# Patient Record
Sex: Female | Born: 1950 | Race: White | Hispanic: No | Marital: Married | Smoking: Never smoker
Health system: Southern US, Community
[De-identification: ages and names within clinical notes are randomized; demographics above are authoritative.]

## PROBLEM LIST (undated history)

## (undated) DIAGNOSIS — I4891 Unspecified atrial fibrillation: Secondary | ICD-10-CM

## (undated) DIAGNOSIS — I10 Essential (primary) hypertension: Secondary | ICD-10-CM

## (undated) DIAGNOSIS — I2510 Atherosclerotic heart disease of native coronary artery without angina pectoris: Secondary | ICD-10-CM

## (undated) DIAGNOSIS — E785 Hyperlipidemia, unspecified: Secondary | ICD-10-CM

## (undated) DIAGNOSIS — N189 Chronic kidney disease, unspecified: Secondary | ICD-10-CM

## (undated) DIAGNOSIS — R0602 Shortness of breath: Secondary | ICD-10-CM

## (undated) DIAGNOSIS — G4730 Sleep apnea, unspecified: Secondary | ICD-10-CM

## (undated) HISTORY — PX: TONSILLECTOMY: SUR1361

## (undated) HISTORY — PX: CHOLEDOCHAL CYST EXCISION: SHX1342

## (undated) HISTORY — PX: PARATHYROIDECTOMY: SHX19

## (undated) HISTORY — DX: Essential (primary) hypertension: I10

## (undated) HISTORY — DX: Hyperlipidemia, unspecified: E78.5

## (undated) HISTORY — PX: ENDOVENOUS ABLATION SAPHENOUS VEIN W/ LASER: SUR449

## (undated) HISTORY — DX: Sleep apnea, unspecified: G47.30

## (undated) HISTORY — DX: Atherosclerotic heart disease of native coronary artery without angina pectoris: I25.10

## (undated) HISTORY — DX: Unspecified atrial fibrillation: I48.91

## (undated) HISTORY — PX: HEMORROIDECTOMY: SUR656

---

## 1998-05-05 ENCOUNTER — Ambulatory Visit (HOSPITAL_COMMUNITY): Admission: RE | Admit: 1998-05-05 | Discharge: 1998-05-06 | Payer: Self-pay

## 2002-03-07 ENCOUNTER — Ambulatory Visit (HOSPITAL_COMMUNITY): Admission: RE | Admit: 2002-03-07 | Discharge: 2002-03-07 | Payer: Self-pay | Admitting: Family Medicine

## 2002-03-07 ENCOUNTER — Encounter: Payer: Self-pay | Admitting: Family Medicine

## 2003-09-13 ENCOUNTER — Ambulatory Visit (HOSPITAL_COMMUNITY): Admission: RE | Admit: 2003-09-13 | Discharge: 2003-09-13 | Payer: Self-pay | Admitting: Family Medicine

## 2006-05-15 ENCOUNTER — Emergency Department (HOSPITAL_COMMUNITY): Admission: EM | Admit: 2006-05-15 | Discharge: 2006-05-15 | Payer: Self-pay | Admitting: *Deleted

## 2006-05-27 ENCOUNTER — Encounter (INDEPENDENT_AMBULATORY_CARE_PROVIDER_SITE_OTHER): Payer: Self-pay | Admitting: Cardiology

## 2006-05-27 ENCOUNTER — Inpatient Hospital Stay (HOSPITAL_COMMUNITY): Admission: EM | Admit: 2006-05-27 | Discharge: 2006-05-30 | Payer: Self-pay | Admitting: Emergency Medicine

## 2006-05-29 ENCOUNTER — Encounter: Payer: Self-pay | Admitting: Cardiology

## 2006-07-04 ENCOUNTER — Encounter: Admission: RE | Admit: 2006-07-04 | Discharge: 2006-07-04 | Payer: Self-pay | Admitting: Endocrinology

## 2007-02-09 ENCOUNTER — Encounter: Admission: RE | Admit: 2007-02-09 | Discharge: 2007-02-09 | Payer: Self-pay | Admitting: Endocrinology

## 2007-09-22 ENCOUNTER — Encounter: Admission: RE | Admit: 2007-09-22 | Discharge: 2007-09-22 | Payer: Self-pay | Admitting: Endocrinology

## 2008-05-09 IMAGING — US US ABDOMEN COMPLETE
1 series · 14 of 25 positions shown · non-contrast
Comparison: none

CLINICAL DATA: 54-year-old with right arm pain.   Abdominal pain.  Evaluate for gallbladder disease.  
 ABDOMEN ULTRASOUND:
TECHNIQUE: Complete abdominal ultrasound examination was performed including evaluation of the liver, gallbladder, bile ducts, pancreas, kidneys, spleen, IVC, and abdominal aorta.

[Series 1: unknown · 0.34mm/px · 14 of 68 slices shown]
[im 1/68]
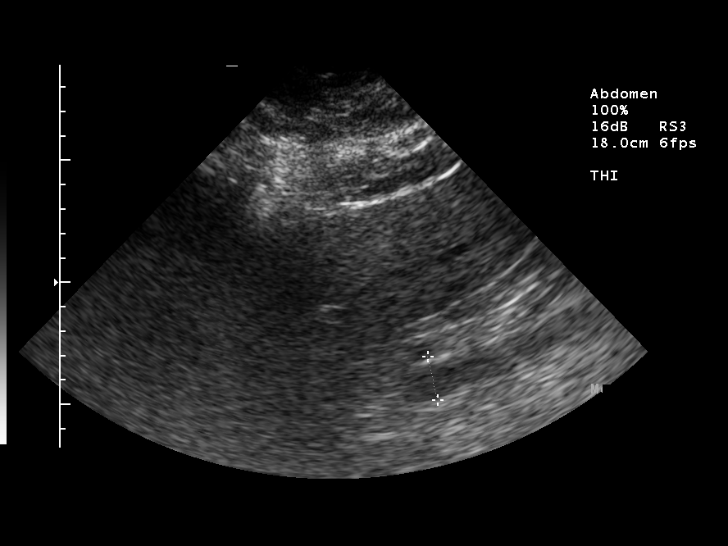
[im 6/68]
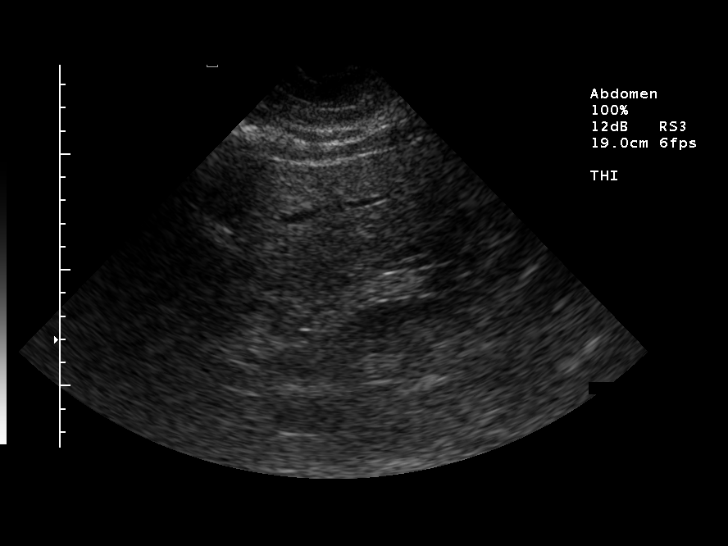
[im 12/68]
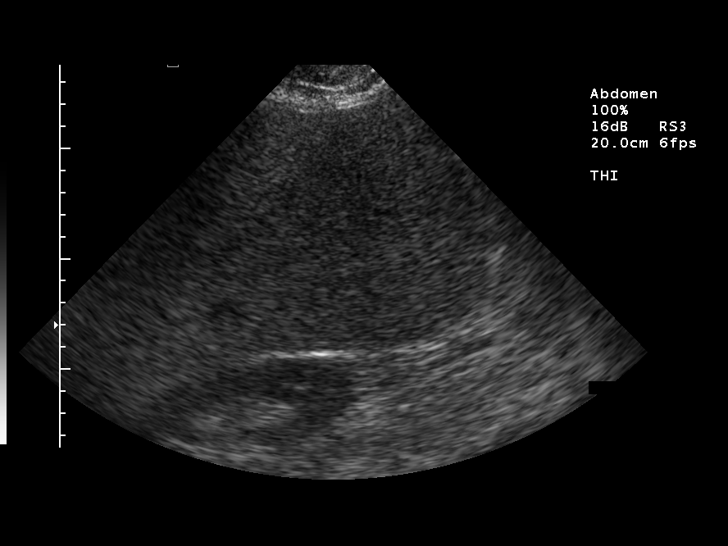
[im 17/68]
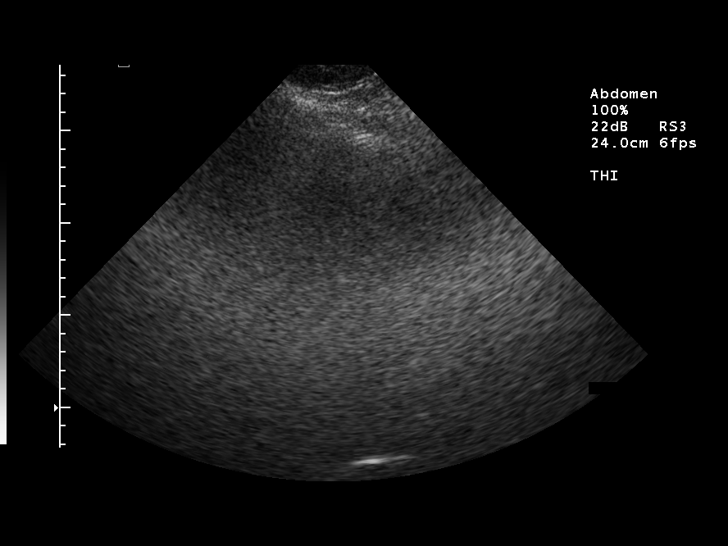
[im 23/68]
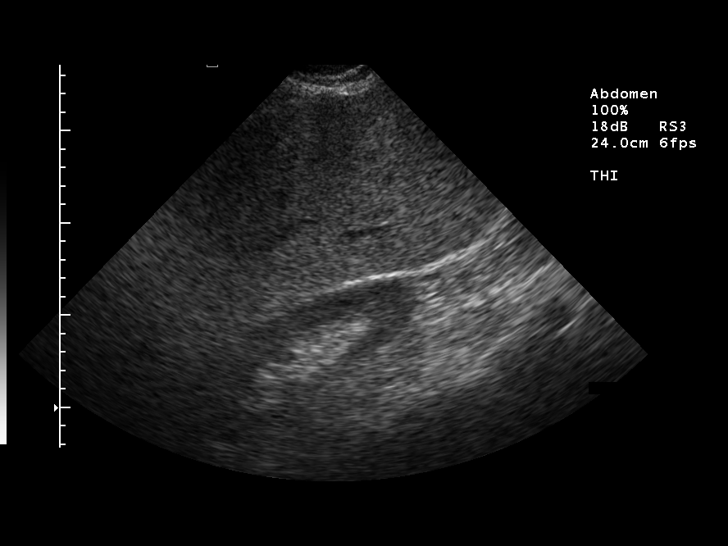
[im 26/68]
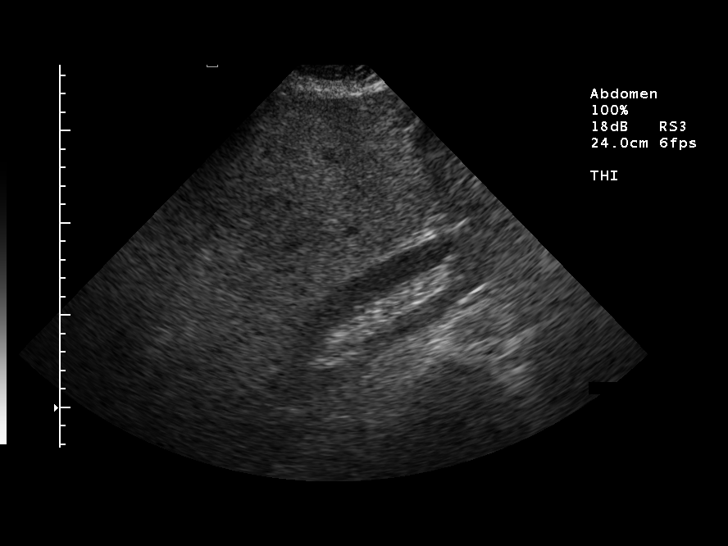
[im 31/68]
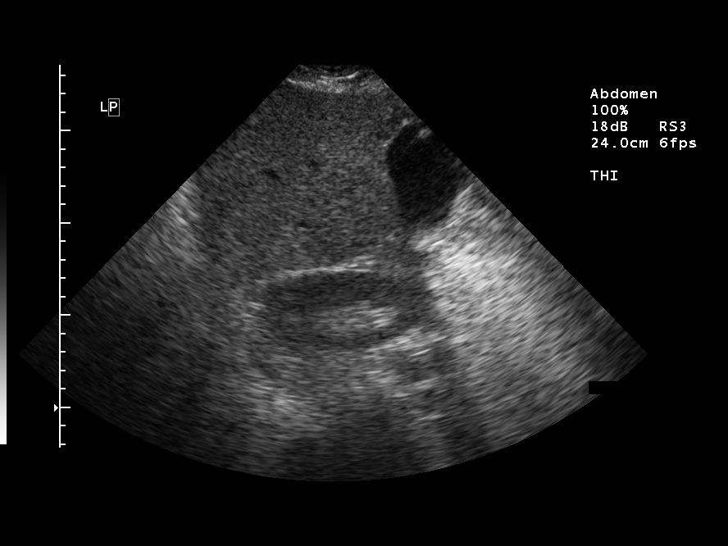
[im 37/68]
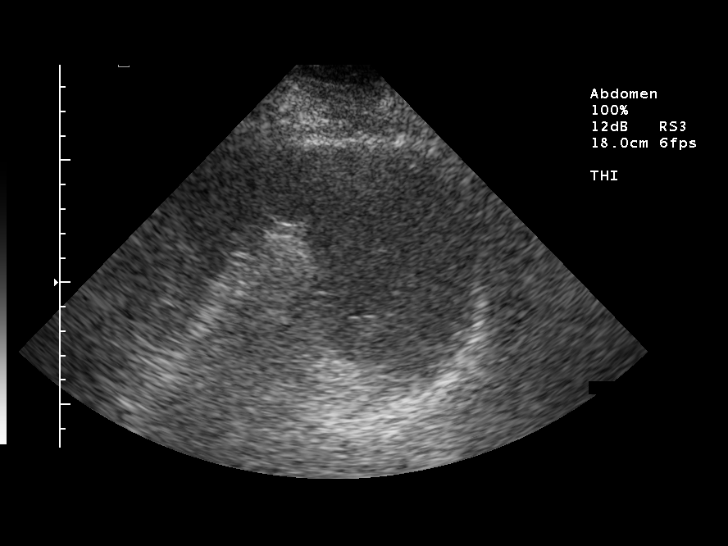
[im 42/68]
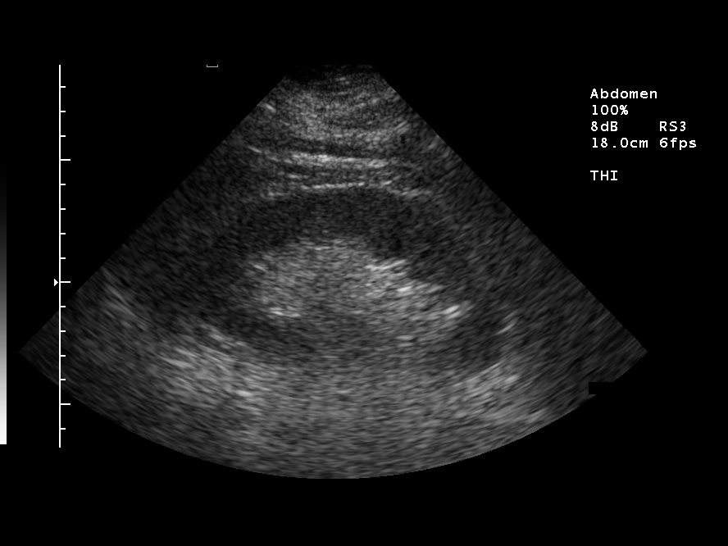
[im 45/68]
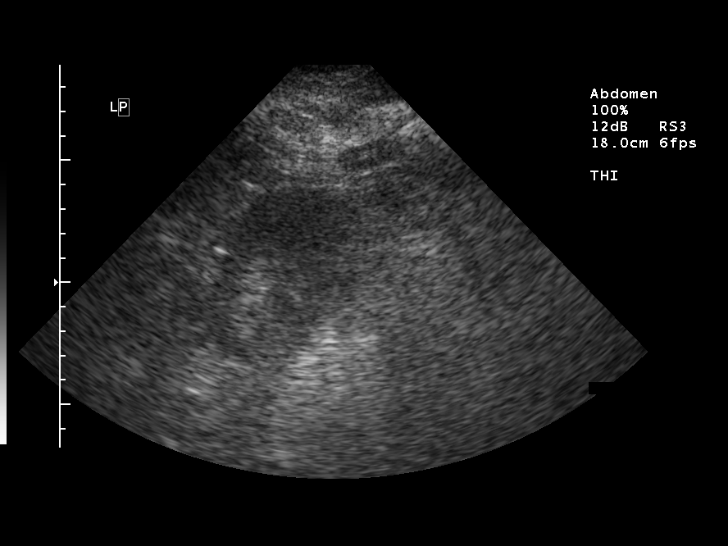
[im 51/68]
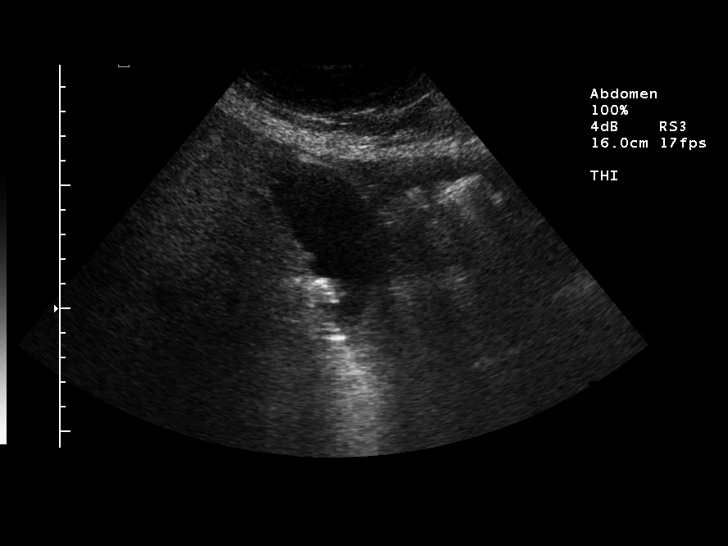
[im 56/68]
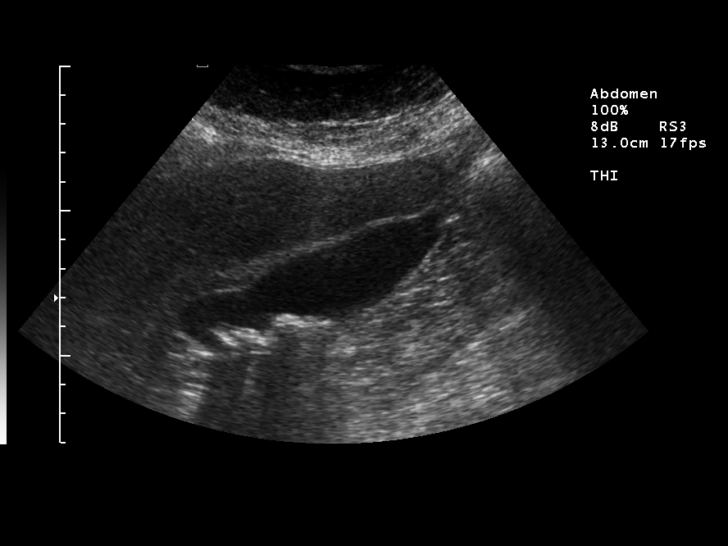
[im 62/68]
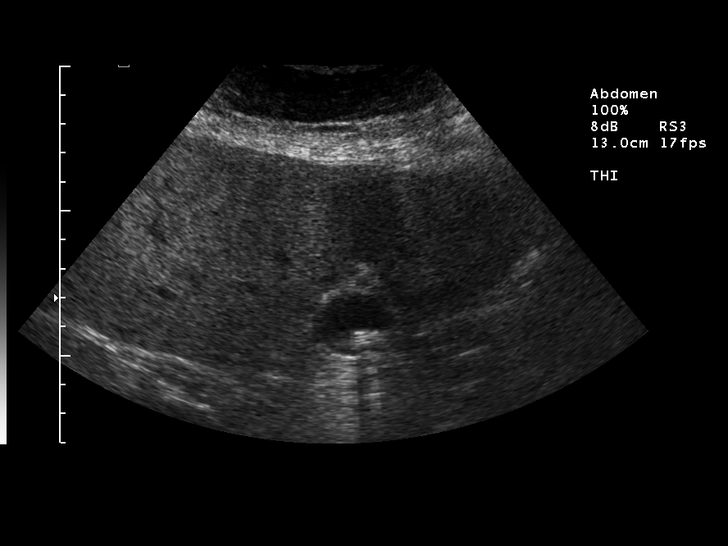
[im 68/68]
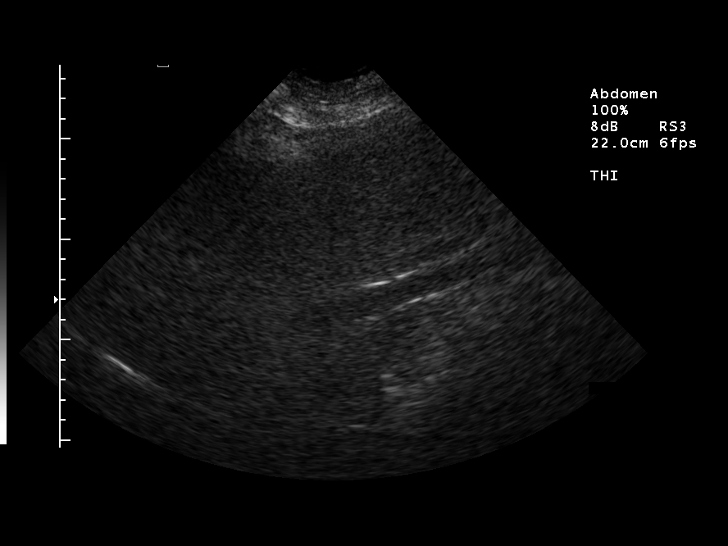

[14 of 25 positions shown; findings below may reference images not displayed]

FINDINGS: The liver is enlarged and echogenic, consistent with fatty infiltration.  No focal abnormality is identified.  Within the gallbladder numerous stones are identified.  The gallbladder wall is thickened measuring 3.3 mm.  The common bile duct is normal in caliber at 3.5 mm. Inferior vena cava, spleen, kidneys and the visualized abdominal aorta have a normal appearance.  The pancreas is not seen.
IMPRESSION: 1.  Enlarged, fatty liver.
 2.  Gallstones and thickened gallbladder wall.  The presence or absence of Murphy?s sign was not documented.

## 2008-05-21 IMAGING — CR DG CHEST 1V PORT
1 series · 1 of 1 positions shown · non-contrast
Comparison: None.

CLINICAL DATA: Right arm pain/arrhythmia.
 PORTABLE CHEST - 1 VIEW:

[view not recorded]
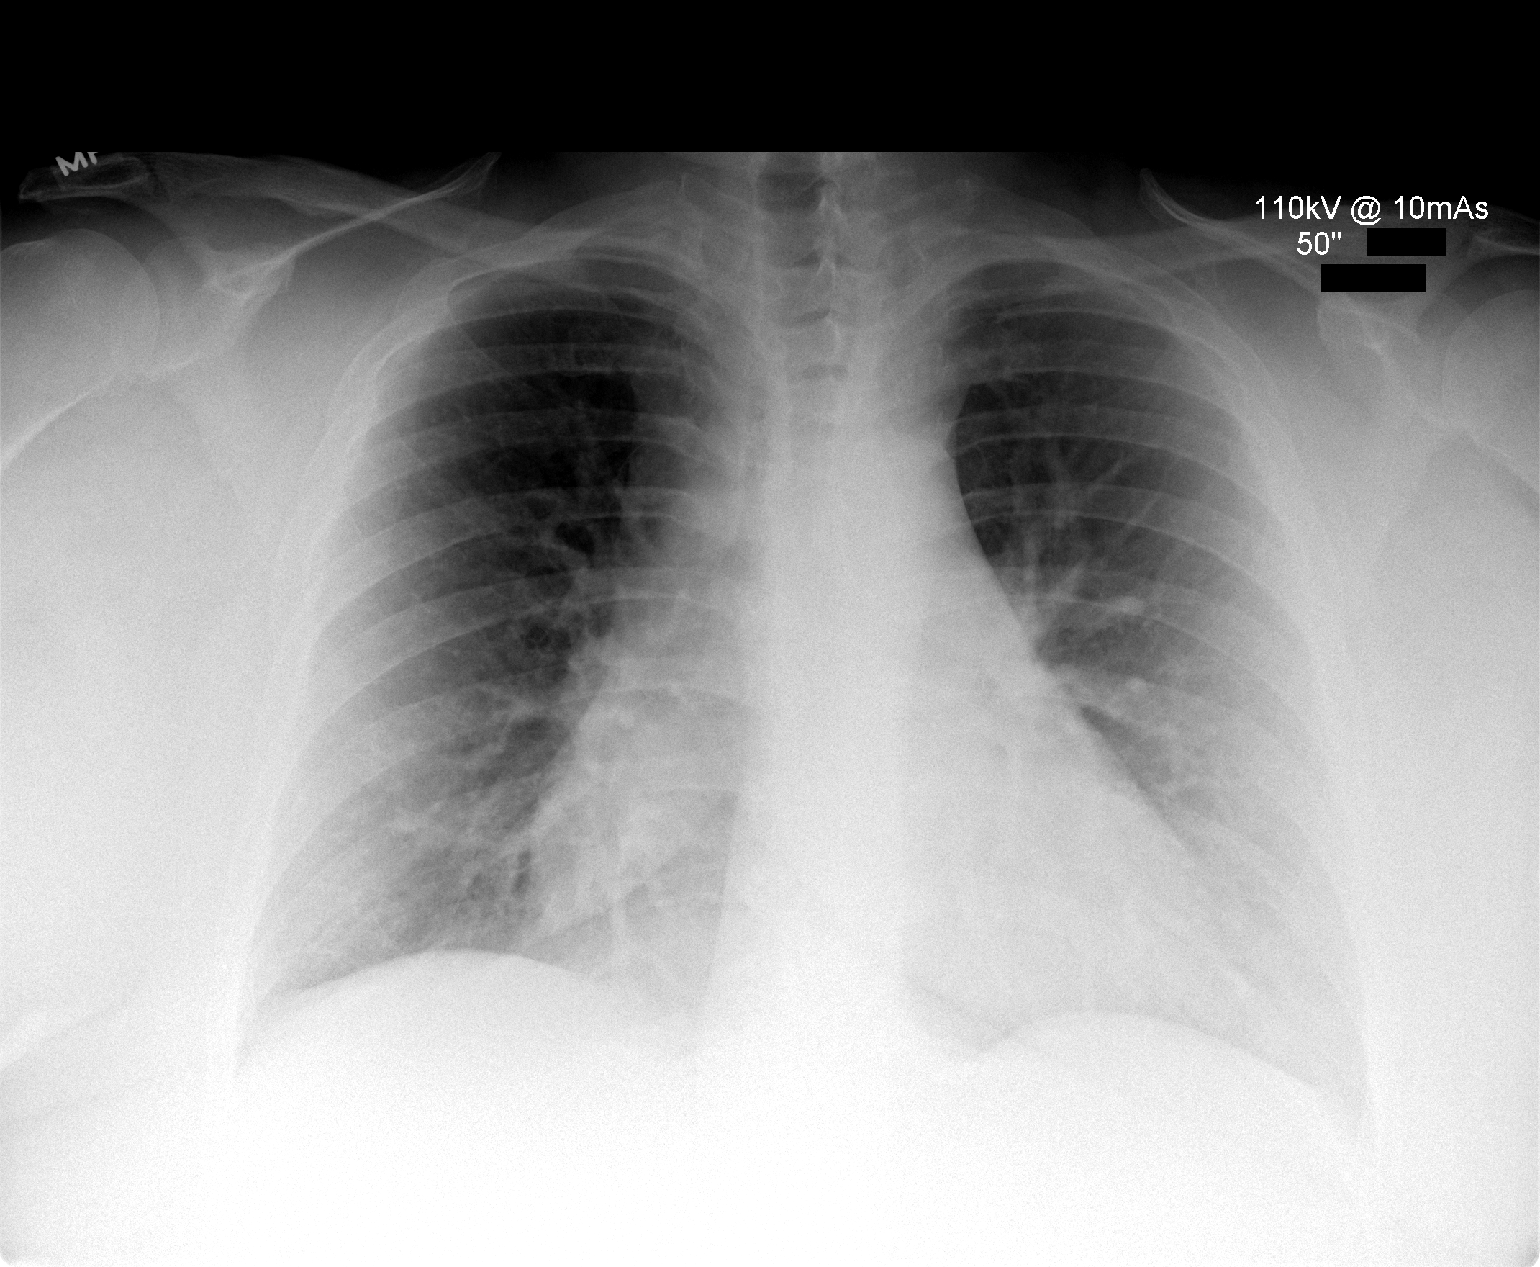

[1 of 1 positions shown; findings below may reference images not displayed]

FINDINGS: The heart is mildly enlarged.  There appears to be some degree of pulmonary venous hypertension but no frank congestive heart failure.   The lungs are clear.  Osseous structures intact in 1 view.
IMPRESSION: 1.  Cardiomegaly with venous hypertension.
 2.  No frank congestive heart failure or active disease.

## 2008-12-12 ENCOUNTER — Encounter: Admission: RE | Admit: 2008-12-12 | Discharge: 2008-12-12 | Payer: Self-pay | Admitting: Endocrinology

## 2009-10-08 ENCOUNTER — Encounter (HOSPITAL_BASED_OUTPATIENT_CLINIC_OR_DEPARTMENT_OTHER): Admission: RE | Admit: 2009-10-08 | Discharge: 2010-01-06 | Payer: Self-pay | Admitting: Internal Medicine

## 2009-10-09 ENCOUNTER — Ambulatory Visit: Admission: RE | Admit: 2009-10-09 | Discharge: 2009-10-09 | Payer: Self-pay | Admitting: General Surgery

## 2009-10-31 ENCOUNTER — Ambulatory Visit: Payer: Self-pay | Admitting: Vascular Surgery

## 2009-10-31 ENCOUNTER — Ambulatory Visit: Admission: RE | Admit: 2009-10-31 | Discharge: 2009-10-31 | Payer: Self-pay | Admitting: Internal Medicine

## 2009-10-31 ENCOUNTER — Encounter: Payer: Self-pay | Admitting: Internal Medicine

## 2010-01-06 ENCOUNTER — Encounter (HOSPITAL_BASED_OUTPATIENT_CLINIC_OR_DEPARTMENT_OTHER): Admission: RE | Admit: 2010-01-06 | Discharge: 2010-04-06 | Payer: Self-pay | Admitting: Internal Medicine

## 2010-04-16 ENCOUNTER — Encounter (HOSPITAL_BASED_OUTPATIENT_CLINIC_OR_DEPARTMENT_OTHER): Admission: RE | Admit: 2010-04-16 | Discharge: 2010-07-03 | Payer: Self-pay | Admitting: General Surgery

## 2010-07-06 ENCOUNTER — Encounter (HOSPITAL_BASED_OUTPATIENT_CLINIC_OR_DEPARTMENT_OTHER)
Admission: RE | Admit: 2010-07-06 | Discharge: 2010-10-04 | Payer: Self-pay | Source: Home / Self Care | Attending: Internal Medicine | Admitting: Internal Medicine

## 2010-07-27 ENCOUNTER — Encounter: Admission: RE | Admit: 2010-07-27 | Discharge: 2010-07-27 | Payer: Self-pay | Admitting: Family Medicine

## 2010-10-08 ENCOUNTER — Encounter (HOSPITAL_BASED_OUTPATIENT_CLINIC_OR_DEPARTMENT_OTHER)
Admission: RE | Admit: 2010-10-08 | Discharge: 2010-11-03 | Payer: Self-pay | Source: Home / Self Care | Attending: General Surgery | Admitting: General Surgery

## 2010-10-25 ENCOUNTER — Encounter: Payer: Self-pay | Admitting: Cardiology

## 2010-11-05 ENCOUNTER — Other Ambulatory Visit (HOSPITAL_COMMUNITY): Payer: Self-pay | Admitting: Obstetrics and Gynecology

## 2010-11-05 DIAGNOSIS — N838 Other noninflammatory disorders of ovary, fallopian tube and broad ligament: Secondary | ICD-10-CM

## 2010-11-06 ENCOUNTER — Encounter (HOSPITAL_BASED_OUTPATIENT_CLINIC_OR_DEPARTMENT_OTHER): Payer: BC Managed Care – HMO | Attending: General Surgery

## 2010-11-06 DIAGNOSIS — I890 Lymphedema, not elsewhere classified: Secondary | ICD-10-CM | POA: Insufficient documentation

## 2010-11-06 DIAGNOSIS — R1900 Intra-abdominal and pelvic swelling, mass and lump, unspecified site: Secondary | ICD-10-CM | POA: Insufficient documentation

## 2010-11-16 ENCOUNTER — Other Ambulatory Visit (HOSPITAL_COMMUNITY): Payer: Self-pay

## 2010-12-04 ENCOUNTER — Encounter (HOSPITAL_BASED_OUTPATIENT_CLINIC_OR_DEPARTMENT_OTHER): Payer: BC Managed Care – HMO | Attending: General Surgery

## 2010-12-04 DIAGNOSIS — I890 Lymphedema, not elsewhere classified: Secondary | ICD-10-CM | POA: Insufficient documentation

## 2010-12-04 DIAGNOSIS — R1900 Intra-abdominal and pelvic swelling, mass and lump, unspecified site: Secondary | ICD-10-CM | POA: Insufficient documentation

## 2010-12-20 LAB — BASIC METABOLIC PANEL
BUN: 19 mg/dL (ref 6–23)
CO2: 26 mEq/L (ref 19–32)
Calcium: 8.6 mg/dL (ref 8.4–10.5)
Chloride: 103 mEq/L (ref 96–112)
Creatinine, Ser: 1.02 mg/dL (ref 0.4–1.2)
GFR calc Af Amer: >60 mL/min (ref >60)
GFR calc non Af Amer: 56 mL/min — ABNORMAL LOW (ref >60)
Glucose, Bld: 91 mg/dL (ref 70–99)
Potassium: 4.1 mEq/L (ref 3.5–5.1)
Sodium: 137 mEq/L (ref 135–145)

## 2010-12-20 LAB — CBC
HCT: 35 % — ABNORMAL LOW (ref 36.0–46.0)
Hemoglobin: 11.2 g/dL — ABNORMAL LOW (ref 12.0–15.0)
MCHC: 32.1 g/dL (ref 30.0–36.0)
MCV: 77.7 fL — ABNORMAL LOW (ref 78.0–100.0)
Platelets: 238 10*3/uL (ref 150–400)
RBC: 4.51 MIL/uL (ref 3.87–5.11)
RDW: 18 % — ABNORMAL HIGH (ref 11.5–15.5)
WBC: 9 10*3/uL (ref 4.0–10.5)

## 2010-12-20 LAB — DIFFERENTIAL
Basophils Absolute: 0.1 10*3/uL (ref 0.0–0.1)
Basophils Relative: 1 % (ref 0–1)
Eosinophils Absolute: 0 10*3/uL (ref 0.0–0.7)
Eosinophils Relative: 1 % (ref 0–5)
Lymphocytes Relative: 11 % — ABNORMAL LOW (ref 12–46)
Lymphs Abs: 1 10*3/uL (ref 0.7–4.0)
Monocytes Absolute: 0.7 10*3/uL (ref 0.1–1.0)
Monocytes Relative: 7 % (ref 3–12)
Neutro Abs: 7.2 10*3/uL (ref 1.7–7.7)
Neutrophils Relative %: 80 % — ABNORMAL HIGH (ref 43–77)

## 2010-12-20 LAB — SEDIMENTATION RATE: Sed Rate: 9 mm/hr (ref 0–22)

## 2011-01-05 ENCOUNTER — Encounter (HOSPITAL_BASED_OUTPATIENT_CLINIC_OR_DEPARTMENT_OTHER): Payer: BC Managed Care – HMO | Attending: General Surgery

## 2011-01-05 DIAGNOSIS — I890 Lymphedema, not elsewhere classified: Secondary | ICD-10-CM | POA: Insufficient documentation

## 2011-01-05 DIAGNOSIS — R1900 Intra-abdominal and pelvic swelling, mass and lump, unspecified site: Secondary | ICD-10-CM | POA: Insufficient documentation

## 2011-02-03 ENCOUNTER — Encounter (HOSPITAL_BASED_OUTPATIENT_CLINIC_OR_DEPARTMENT_OTHER): Payer: BC Managed Care – HMO | Attending: General Surgery

## 2011-02-03 DIAGNOSIS — I2510 Atherosclerotic heart disease of native coronary artery without angina pectoris: Secondary | ICD-10-CM | POA: Insufficient documentation

## 2011-02-03 DIAGNOSIS — G4730 Sleep apnea, unspecified: Secondary | ICD-10-CM | POA: Insufficient documentation

## 2011-02-03 DIAGNOSIS — E119 Type 2 diabetes mellitus without complications: Secondary | ICD-10-CM | POA: Insufficient documentation

## 2011-02-03 DIAGNOSIS — Z7901 Long term (current) use of anticoagulants: Secondary | ICD-10-CM | POA: Insufficient documentation

## 2011-02-03 DIAGNOSIS — Z794 Long term (current) use of insulin: Secondary | ICD-10-CM | POA: Insufficient documentation

## 2011-02-03 DIAGNOSIS — E7800 Pure hypercholesterolemia, unspecified: Secondary | ICD-10-CM | POA: Insufficient documentation

## 2011-02-03 DIAGNOSIS — I10 Essential (primary) hypertension: Secondary | ICD-10-CM | POA: Insufficient documentation

## 2011-02-03 DIAGNOSIS — Z79899 Other long term (current) drug therapy: Secondary | ICD-10-CM | POA: Insufficient documentation

## 2011-02-03 DIAGNOSIS — L02419 Cutaneous abscess of limb, unspecified: Secondary | ICD-10-CM | POA: Insufficient documentation

## 2011-02-03 DIAGNOSIS — I890 Lymphedema, not elsewhere classified: Secondary | ICD-10-CM | POA: Insufficient documentation

## 2011-02-03 DIAGNOSIS — L97809 Non-pressure chronic ulcer of other part of unspecified lower leg with unspecified severity: Secondary | ICD-10-CM | POA: Insufficient documentation

## 2011-02-03 DIAGNOSIS — L03119 Cellulitis of unspecified part of limb: Secondary | ICD-10-CM | POA: Insufficient documentation

## 2011-02-03 DIAGNOSIS — I4891 Unspecified atrial fibrillation: Secondary | ICD-10-CM | POA: Insufficient documentation

## 2011-02-19 NOTE — Discharge Summary (Signed)
Robin [name], Robin [name]             ACCOUNT NO.:  [id number]   MEDICAL RECORD NO.:  [id number]          PATIENT TYPE:  INP   LOCATION:  1432                         FACILITY:  [facility]   PHYSICIAN:  Hollice Espy, M.D.DATE OF BIRTH:  05-Apr-1951   DATE OF ADMISSION:  05/27/2006  DATE OF DISCHARGE:  05/30/2006                                 DISCHARGE SUMMARY   PRIMARY CARE PHYSICIAN:  Stacie Acres. Cliffton Asters, M.D.   CONSULTATIONS:  Francisca December, M.D., [facility].   DISCHARGE DIAGNOSES:  1. Paroxysmal atrial fibrillation.  2. Morbid obesity.  3. Negative stress Myoview cardiac test.  4. Diabetes mellitus.  5. Hypertension.   DISCHARGE MEDICATIONS:  The patient will continue all her previous  medications.  These are as follows.  1. Wellbutrin 300 p.o. daily.  2. Glipizide 10 p.o. b.i.d.  3. Hyzaar 100/25 p.o. daily.  4. Avandia 2 mg p.o. daily.  5. Vitorin 10/80 p.o. nightly.  6. Metformin 2000 mg p.o. nightly.  7. Lantus 36 units subcutaneously nightly.  8. In addition, the patient will be discharged on Coumadin 10 mg p.o.      nightly. This medication may be adjusted depending on final Coumadin      dosing.  9. Lovenox 140 mg subcutaneously 2 times for 1 week or until Coumadin      level is within goal range.  10.Lopressor 25 mg 1/2 tablet p.o. b.i.d.   HOSPITAL COURSE:  The patient is a 60 year old white female with past  medical history of morbid obesity, diabetes mellitus, who presented with  some right arm pain. Initially when she came into the emergency room, she  was noted to have new onset atrial fibrillation. [facility] was  consulted to evaluate the patient.  The patient had a TSH done which was  normal.  A 2-D echocardiogram was ordered, and she was admitted.  She  converted to normal sinus rhythm by hospital day #2 and was doing well.  Dr.  Amil Amen took the patient for a Myoview stress test which was found to be  negative.  The patient stayed in normal  sinus rhythm. After extensive review  and discussion with the patient, it was suspected she had underlying sleep  apnea which could be a possible etiology for her atrial fibrillation.  Currently the patient is on heparin and started receiving Coumadin, and it  was the feeling that, given she is high risk for recurrent atrial  fibrillation if this is indeed secondary to sleep apnea, it was best that  she needed to stay on Coumadin for now.   PLAN:  1. In regards to atrial fibrillation.  Discharge the patient home on      Coumadin plus Lovenox with home health followup.  The patient will      receive the Coumadin instructional video as well as how to give Lovenox      prior to discharge.  Home health will follow up and check daily PT/INR      and send results to Dr. Laurann Montana.  2. In addition with regards to sleep apnea suspicion, the patient has  been      given a number to call to get an outpatient sleep study.  3. With regard to diabetes mellitus, this was stable.  She was continued      on her medications.  4. With regards to hypertension, Lopressor has been added so that she will      have better rate control in case she does have this occur again.  5. With regards to obesity, this is stable.   OVERALL DISPOSITION:  Improved.   ACTIVITY:  As tolerated.   DIET:  Low-sodium, carbohydrate-modified diet with avoiding caffeine.   FOLLOW UP:  She will follow up with Dr. Laurann Montana, her primary care  physician, in 1 week and Dr. Amil Amen, cardiology, this week.   The patient is being discharged to home.  She is cleared to return back to  work on June 01, 2006,      Hollice Espy, M.D.  Electronically Signed     SKK/MEDQ  D:  05/30/2006  T:  05/30/2006  Job:  [id number]   cc:   Francisca December, M.D.  Fax: [phone]   Stacie Acres. Cliffton Asters, M.D.  Fax: [phone]

## 2011-02-19 NOTE — H&P (Signed)
Robin [name], Robin [name]             ACCOUNT NO.:  [id number]   MEDICAL RECORD NO.:  [id number]          PATIENT TYPE:  INP   LOCATION:  1432                         FACILITY:  [facility]   PHYSICIAN:  Michelene Gardener, MD    DATE OF BIRTH:  21-Nov-1950   DATE OF ADMISSION:  05/27/2006  DATE OF DISCHARGE:                                HISTORY & PHYSICAL   PRIMARY CARE PHYSICIAN:  Dr. Laurann Montana.   CHIEF COMPLAINT:  Right arm pain.   HISTORY OF PRESENT ILLNESS:  This is a 60 year old obese Caucasian female  with past medical history of hypertension, diabetes and hypercholesterolemia  who presented with the above-mentioned complaint.  The patient states that  she has been doing fine when she started developing right arm pain yesterday  for which she took aspirin and it went away.  Today she had repeat arm pain  on her right side and she was also complaining of some numbness in her left  hand.  She said she had better come to the ER to be checked out because  there is significant family history in both her mother's and father's side.  She said that she had chest pressure around 2 days ago for which she took  Phenergan and that was resolved.  Denied shortness of breath.  There is no  vomiting.  There is no sweating.   PAST MEDICAL HISTORY:  1. Hypertension.  2. Diabetes mellitus.  3. Hypercholesterolemia.  4. Obesity.   PAST SURGICAL HISTORY:  Denied.   ALLERGIES:  NO KNOWN DRUG ALLERGIES.   MEDICATIONS:  1. Wellbutrin XL 300 mg p.o. once daily.  2. Glipizide 10 mg p.o. twice daily.  3. Hyzaar 100/25 mg p.o. once daily.  4. Avandia 2 mg p.o. once daily.  5. Vytorin 10/80 mg p.o. once daily.  6. Metformin 2000 mg p.o. once daily.  7. Lantus 36  units subcutaneously at bedtime.   SOCIAL HISTORY:  She denied drinking, she quit smoking recently and denied  recreational drugs.   FAMILY HISTORY:  Family history is significant for one of her brothers died  at the age of 43 because  of a massive heart attack, another one of her  brothers died of again massive heart attack at the age of 68, her father had  heart attack at age 106, mother had heart attack with bypass surgery around  age of 66.   REVIEW OF SYSTEMS:  CONSTITUTIONAL:  There is no fatigability, no weakness.  EYES:  No blurred vision, no pain.  ENT:  No epistaxis, no difficulty  swallowing.  RESPIRATORY:  No cough, no wheezes.  CARDIOVASCULAR:  No chest  pain, no shortness of breath.  GI:  No nausea, no vomiting, no diarrhea, no  abdominal pain.  GU:  No dysuria, no hematuria.  ENDOCRINE:  No polyuria, no  nocturia.  MUSCULOSKELETAL:  Positive for right arm pain with some left hand  numbness.  ID:  No rash, no lesions.  NEURO:  No numbness, no tingling and  positive numbness in the left hand.  The rest of systems were reviewed and  they were negative.   PHYSICAL EXAMINATION:  VITAL SIGNS:  Temperature 98.4, blood pressure is  178/89, pulse 73, respiratory rate is 18.  GENERAL APPEARANCE:  This is an obese middle-aged Caucasian female who is  laying down in bed in no significant distress at the present time.  HEENT:  Conjunctivae is normal with no palor and no erythema.  Pupils are  equal and reactive to light and accommodation.  There is no ptosis.  Hearing  is intact.  There is no ear discharge or infection.  There is no infection  or bleeding.  Oral mucosa is moist and there is no pharyngeal erythema.  NECK:  Supple.  No JVD.  No carotid bruit.  No lymphadenopathy .  No thyroid  enlargement.  No thyroid tenderness.  CARDIOVASCULAR EXAMINATION:  S1 and S2 were heard and they are regular.  There is no murmur,  or rubs.  RESPIRATORY EXAMINATION:  The patient is breathing between 16-18, no use of  accessory muscles, no intercostal retractions, no dullness, no rales, no  rhonchi, no wheezes.  ABDOMINAL EXAMINATION:  The patient is soft, nondistended, nontender, no  hepatosplenomegaly.  Bowel sounds are  normal.  Umbilicus central.  LOWER EXTREMITIES:  No edema, no rash and no varicose veins.  SKIN:  No rash and no erythema.  NEURO EXAMINATION:  Cranial nerves are intact, no sensory deficits.   LABORATORY RESULTS:  WBC 7.8, hemoglobin 15.7, hematocrit 41.8, MCV is 81.3,  platelets 257.  Sodium 141, potassium 3.8, chloride 107, bicarb 26, glucose  176, BUN 6, creatinine 0.65, calcium 9.0.  Chest x-ray showed cardiomegaly  with venous hypertension.  There is no active disease.  EKG is atrial  fibrillation with rate of 82.  There is no evidence of acute ischemia.   IMPRESSION AND ASSESSMENT:  1. New onset atrial fibrillation.  This patient has never had history of      atrial fibrillation.  I will admit her to telemetry.  I will start her      on Lovenox 100 mg p.o. twice daily.  I will get echocardiogram for      further evaluation.  I will also get cardiology consultation to see if      this patient will be a candidate for long-term anticoagulation.  2. Acute coronary syndrome.  This patient had atypical pain when she had      chest pain around 2 days ago and then was complaining of right shoulder      pain and some left hand numbness.  Her presentation is very atypical      but she had multiple risk factors including diabetes, hypertension,      hypercholesterolemia, obesity and very strong family history, so I will      admit her to telemetry, get three sets of troponin and cardiac enzymes,      start her on aspirin, beta blockers, lisinopril and Lovenox.  I will      also get cardiology consultation for further evaluation and possible      stress test.  3. Diabetes mellitus.  We will continue her current medications and I will      put her on insulin sliding scale.  I will also get hemoglobin A1c to      assess her control in the last 3 months.  4. Hypertension.  We will continue her current medications and we will      follow her blood pressure. 5. Hypercholesterolemia.  We will  continue her  current medications.  6. Obesity.  The patient was advised about the risk.   Total assessment time is 50 minutes.      Michelene Gardener, MD  Electronically Signed     NAE/MEDQ  D:  05/27/2006  T:  05/27/2006  Job:  [id number]   cc:   Stacie Acres. Cliffton Asters, M.D.  Fax: [phone]

## 2011-03-05 ENCOUNTER — Encounter (HOSPITAL_BASED_OUTPATIENT_CLINIC_OR_DEPARTMENT_OTHER): Payer: BC Managed Care – HMO | Attending: General Surgery

## 2011-03-05 DIAGNOSIS — Z794 Long term (current) use of insulin: Secondary | ICD-10-CM | POA: Insufficient documentation

## 2011-03-05 DIAGNOSIS — I890 Lymphedema, not elsewhere classified: Secondary | ICD-10-CM | POA: Insufficient documentation

## 2011-03-05 DIAGNOSIS — L03119 Cellulitis of unspecified part of limb: Secondary | ICD-10-CM | POA: Insufficient documentation

## 2011-03-05 DIAGNOSIS — I4891 Unspecified atrial fibrillation: Secondary | ICD-10-CM | POA: Insufficient documentation

## 2011-03-05 DIAGNOSIS — I2510 Atherosclerotic heart disease of native coronary artery without angina pectoris: Secondary | ICD-10-CM | POA: Insufficient documentation

## 2011-03-05 DIAGNOSIS — I10 Essential (primary) hypertension: Secondary | ICD-10-CM | POA: Insufficient documentation

## 2011-03-05 DIAGNOSIS — Z79899 Other long term (current) drug therapy: Secondary | ICD-10-CM | POA: Insufficient documentation

## 2011-03-05 DIAGNOSIS — L97809 Non-pressure chronic ulcer of other part of unspecified lower leg with unspecified severity: Secondary | ICD-10-CM | POA: Insufficient documentation

## 2011-03-05 DIAGNOSIS — G4730 Sleep apnea, unspecified: Secondary | ICD-10-CM | POA: Insufficient documentation

## 2011-03-05 DIAGNOSIS — L02419 Cutaneous abscess of limb, unspecified: Secondary | ICD-10-CM | POA: Insufficient documentation

## 2011-03-05 DIAGNOSIS — E119 Type 2 diabetes mellitus without complications: Secondary | ICD-10-CM | POA: Insufficient documentation

## 2011-03-05 DIAGNOSIS — E7800 Pure hypercholesterolemia, unspecified: Secondary | ICD-10-CM | POA: Insufficient documentation

## 2011-03-05 DIAGNOSIS — Z7901 Long term (current) use of anticoagulants: Secondary | ICD-10-CM | POA: Insufficient documentation

## 2011-04-05 ENCOUNTER — Encounter (HOSPITAL_BASED_OUTPATIENT_CLINIC_OR_DEPARTMENT_OTHER): Payer: BC Managed Care – PPO | Attending: General Surgery

## 2011-04-05 DIAGNOSIS — I4891 Unspecified atrial fibrillation: Secondary | ICD-10-CM | POA: Insufficient documentation

## 2011-04-05 DIAGNOSIS — I10 Essential (primary) hypertension: Secondary | ICD-10-CM | POA: Insufficient documentation

## 2011-04-05 DIAGNOSIS — E119 Type 2 diabetes mellitus without complications: Secondary | ICD-10-CM | POA: Insufficient documentation

## 2011-04-05 DIAGNOSIS — I2510 Atherosclerotic heart disease of native coronary artery without angina pectoris: Secondary | ICD-10-CM | POA: Insufficient documentation

## 2011-04-05 DIAGNOSIS — E7800 Pure hypercholesterolemia, unspecified: Secondary | ICD-10-CM | POA: Insufficient documentation

## 2011-04-05 DIAGNOSIS — I890 Lymphedema, not elsewhere classified: Secondary | ICD-10-CM | POA: Insufficient documentation

## 2011-04-05 DIAGNOSIS — Z79899 Other long term (current) drug therapy: Secondary | ICD-10-CM | POA: Insufficient documentation

## 2011-04-05 DIAGNOSIS — L0889 Other specified local infections of the skin and subcutaneous tissue: Secondary | ICD-10-CM | POA: Insufficient documentation

## 2011-04-05 DIAGNOSIS — B961 Klebsiella pneumoniae [K. pneumoniae] as the cause of diseases classified elsewhere: Secondary | ICD-10-CM | POA: Insufficient documentation

## 2011-04-05 DIAGNOSIS — L97809 Non-pressure chronic ulcer of other part of unspecified lower leg with unspecified severity: Secondary | ICD-10-CM | POA: Insufficient documentation

## 2011-04-05 DIAGNOSIS — A4902 Methicillin resistant Staphylococcus aureus infection, unspecified site: Secondary | ICD-10-CM | POA: Insufficient documentation

## 2011-04-05 DIAGNOSIS — I872 Venous insufficiency (chronic) (peripheral): Secondary | ICD-10-CM | POA: Insufficient documentation

## 2011-04-05 DIAGNOSIS — Z7901 Long term (current) use of anticoagulants: Secondary | ICD-10-CM | POA: Insufficient documentation

## 2011-05-07 ENCOUNTER — Encounter (HOSPITAL_BASED_OUTPATIENT_CLINIC_OR_DEPARTMENT_OTHER): Payer: BC Managed Care – PPO | Attending: General Surgery

## 2011-05-07 DIAGNOSIS — Z7901 Long term (current) use of anticoagulants: Secondary | ICD-10-CM | POA: Insufficient documentation

## 2011-05-07 DIAGNOSIS — Z79899 Other long term (current) drug therapy: Secondary | ICD-10-CM | POA: Insufficient documentation

## 2011-05-07 DIAGNOSIS — I10 Essential (primary) hypertension: Secondary | ICD-10-CM | POA: Insufficient documentation

## 2011-05-07 DIAGNOSIS — B961 Klebsiella pneumoniae [K. pneumoniae] as the cause of diseases classified elsewhere: Secondary | ICD-10-CM | POA: Insufficient documentation

## 2011-05-07 DIAGNOSIS — I890 Lymphedema, not elsewhere classified: Secondary | ICD-10-CM | POA: Insufficient documentation

## 2011-05-07 DIAGNOSIS — I872 Venous insufficiency (chronic) (peripheral): Secondary | ICD-10-CM | POA: Insufficient documentation

## 2011-05-07 DIAGNOSIS — L97809 Non-pressure chronic ulcer of other part of unspecified lower leg with unspecified severity: Secondary | ICD-10-CM | POA: Insufficient documentation

## 2011-05-07 DIAGNOSIS — E7800 Pure hypercholesterolemia, unspecified: Secondary | ICD-10-CM | POA: Insufficient documentation

## 2011-05-07 DIAGNOSIS — I4891 Unspecified atrial fibrillation: Secondary | ICD-10-CM | POA: Insufficient documentation

## 2011-05-07 DIAGNOSIS — E119 Type 2 diabetes mellitus without complications: Secondary | ICD-10-CM | POA: Insufficient documentation

## 2011-05-07 DIAGNOSIS — L0889 Other specified local infections of the skin and subcutaneous tissue: Secondary | ICD-10-CM | POA: Insufficient documentation

## 2011-05-07 DIAGNOSIS — I2510 Atherosclerotic heart disease of native coronary artery without angina pectoris: Secondary | ICD-10-CM | POA: Insufficient documentation

## 2011-05-07 DIAGNOSIS — A4902 Methicillin resistant Staphylococcus aureus infection, unspecified site: Secondary | ICD-10-CM | POA: Insufficient documentation

## 2011-05-19 NOTE — Progress Notes (Signed)
[facility]  NAME:  Robin [name], Robin [name]             ACCOUNT NO.:  [id number]  MEDICAL RECORD NO.:  [id number]      DATE OF BIRTH:  07/23/1951  PHYSICIAN:  Wayland Denis, DO       VISIT DATE:  05/19/2011                                  OFFICE VISIT   Robin [name] is a 60 year old white female, here for followup on her right lower extremity lymphedema.  She has been using alginate and Unna boot.  She does have some pumps at home, but has not been using them. She saw Vascular consult and the results are unclear.  She also saw a dermatologist who recommended clobetasol which she has been using, so she removed the Unna boot and just has been using the steroid cream. This has caused some increased redness and swelling as well as drainage, so we will modify this.  It has gotten worse as a result over this past week.  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  MEDICATIONS:  Include Lantus, Humalog, Lasix, metoprolol, Hyzaar, Vytorin, Coumadin, and bupropion.  ALLERGIES:  She does not have any allergies.  She was treated for methicillin-resistant staph aureus with Bactrim in late June 2012 and July 2012, this seemed to help.  REVIEW OF SYSTEMS:  Otherwise, negative and no change.  SOCIAL HISTORY:  Unchanged and not smoking.  FAMILY HISTORY:  Noncontributory.  PHYSICAL EXAMINATION:  GENERAL:  She is alert, oriented, and cooperative, in no acute distress.  She is very pleasant. HEENT:  Her pupils were equal.  Extraocular muscles were intact.  No cervical lymphadenopathy. LUNGS:  Clear. HEART:  Irregular, but pulses are equal bilaterally. ABDOMEN:  Soft. EXTREMITIES:  Her lower extremities, she has edema bilaterally, but certainly worse on the right with some clear serous drainage.  There is no actual  wound, but there is some redness that is in the distribution of where her dressing had been.  ASSESSMENT:  Vascular consult second opinion, elevation, vitamin C 500 mg twice a day, zinc try to get close to 220 mg daily, compression at night and in the morning.  We will place a steroid cream with zinc for the periwound area, alginate for the drainage and Unna boot, and we will see her back in 1 week to see her progress.  She was instructed to call us immediately if there was any burning or pain in the area.  She is in agreement with this plan.     Wayland Denis, DO     CS/MEDQ  D:  05/19/2011  T:  05/19/2011  Job:  [id number]

## 2011-05-21 ENCOUNTER — Encounter (INDEPENDENT_AMBULATORY_CARE_PROVIDER_SITE_OTHER): Payer: BC Managed Care – HMO

## 2011-05-21 DIAGNOSIS — L97109 Non-pressure chronic ulcer of unspecified thigh with unspecified severity: Secondary | ICD-10-CM

## 2011-05-21 DIAGNOSIS — R609 Edema, unspecified: Secondary | ICD-10-CM

## 2011-05-26 NOTE — Progress Notes (Signed)
[facility]  NAME:  Robin [name], Robin [name]             ACCOUNT NO.:  [id number]  MEDICAL RECORD NO.:  [id number]      DATE OF BIRTH:  1951-01-10  PHYSICIAN:  Wayland Denis, DO       VISIT DATE:  05/26/2011                                  OFFICE VISIT   Robin [name] is a 60 year old white female who is here for followup from right lower extremity lymphedema.  She has been using with clobetasol, Coban wrap, zinc alginate with very good improvement.  She is quite pleased with it.  The pain initially was pretty irritating with some burning feeling, but has improved since with less drainage and less redness.  There is no change in her medications.  Review of systems is negative otherwise noted.  PHYSICAL EXAMINATION:  She is alert and oriented, cooperative, appropriate, not in any acute distress.  Her breathing is unlabored. Her heart is regular.  Pulses are strong.  Pupils are equal. Extraocular muscles are intact.  Wound is as described.  We will continue with leg pumps at home, vitamin C, zinc alginate, clobetasol, and Coban wrap, and we will wait for the review and report from the vascular surgeon.  We will see her back in 1 week.     Wayland Denis, DO     CS/MEDQ  D:  05/26/2011  T:  05/26/2011  Job:  [id number]

## 2011-05-28 NOTE — Procedures (Unsigned)
DUPLEX DEEP VENOUS EXAM - LOWER EXTREMITY  INDICATION:  Ulcerations.  HISTORY:  Edema:  Yes. Trauma/Surgery:  No. Pain:  Yes. PE:  No. Previous DVT:  No. Anticoagulants:  No. Other:  DUPLEX EXAM:               CFV   SFV   PopV  PTV    GSV               R  L  R  L  R  L  R   L  R  L Thrombosis    o  o  o  o  o  o  o   o  o  o Spontaneous   +  +  +  +  +  +  +   +  +  + Phasic        +  +  +  +  +  +  +   +  +  + Augmentation  +  +  +  +  +  +  +   +  +  + Compressible  +  +  +  +  +  +  +   +  +  + Competent     +  o  +  o  +  +  +   +  o  o  Legend:  + - yes  o - no  p - partial  D - decreased   IMPRESSION: 1. Bilateral patent deep venous systems of the lower extremities. 2. Right small and bilateral great saphenous veins present with reflux     of greater than 500 milliseconds. 3. The left small saphenous vein is patent and competent.         _____________________________ Larina Earthly, M.D.  SH/MEDQ  D:  05/21/2011  T:  05/21/2011  Job:  [id number]

## 2011-06-02 NOTE — Progress Notes (Signed)
[facility]  NAME:  Robin [name], Robin [name]             ACCOUNT NO.:  [id number]  MEDICAL RECORD NO.:  [id number]      DATE OF BIRTH:  1951/04/16  PHYSICIAN:  Wayland Denis, DO       VISIT DATE:  06/02/2011                                  OFFICE VISIT   [name] is here for followup on her right lower extremity ulcers. She was using the Coban 2 wraps with some zinc and alginate.  In the past two times, she has not used the clobetasol.  She was doing well when she came for her re-wrap, but seems to got really irritated this past few days.  We are not really sure why that has happened.  There has been no other change in her review of systems, medications, or social situation.  On exam, she is alert, oriented, cooperative, not in any acute distress.  She is very pleasant.  Her pupils are equal. Extraocular muscles are intact.  No cervical lymphadenopathy.  Breathing is unlabored.  Heart is regular.  Lower extremity is very red, little bit oozy, it does not look infected, but definitely the skin has been irritated.  We would like her to continue with the compression.  We are going to try mixing some of the clobetasol with TCA and continue with the Coban wrap.  Her vascular studies came back and are not clear to me anyway and whether or not she is a candidate for any intervention.  This study shows some insufficiency.  We are going to have Dr. Arbie Cookey take a look at her to see if she is a candidate for anything that may help and she is agreed to that.  We will see her back in 1 week.     Wayland Denis, DO     CS/MEDQ  D:  06/02/2011  T:  06/02/2011  Job:  [id number]

## 2011-06-09 ENCOUNTER — Encounter (HOSPITAL_BASED_OUTPATIENT_CLINIC_OR_DEPARTMENT_OTHER): Payer: BC Managed Care – PPO | Attending: Plastic Surgery

## 2011-06-09 DIAGNOSIS — I890 Lymphedema, not elsewhere classified: Secondary | ICD-10-CM | POA: Insufficient documentation

## 2011-06-16 NOTE — Progress Notes (Signed)
[facility]  NAME:  Robin [name], Robin [name]             ACCOUNT NO.:  [id number]  MEDICAL RECORD NO.:  [id number]      DATE OF BIRTH:  02/22/1951  PHYSICIAN:  Wayland Denis, DO       VISIT DATE:  06/16/2011                                  OFFICE VISIT   Danniella is here for followup on her right lower extremity lymphedema. She has been using compression with Coban, TCA, and clobetasol for the past 2 weeks.  She intermittently uses the compression that she has at home.  She admits to not being completely compliant with the treatment protocol, elevation, compression.  Unfortunately, there has not been any weight loss.  Additionally, unfortunately she removed her dressing 5 days ago and did not come to have it reapplied, and admits to being aware of that was the agreed upon plan.  [facility].  Her main doctor was Dr. Lurene Shadow. Her blood sugars are controlled.  Her blood pressure is controlled.  On exam, there has been no other change in her medications or social history.  On exam, she is alert, oriented, and cooperative.  She is pleasant.  Her pupils are equal.  Her breathing is unlabored.  Her extraocular muscles are intact.  Her pulses are strong and regular. Lower extremities, bilateral swelling with hard, woody skin on the left, and today she has a Maxi Pad wrapped around her leg.  She says she bumped and it blistered up.  She does not have compression hose on.  She has an old tube sock on the right side, it looks like a dead on last visit, the skin is tight.  There is some chronic redness, venous insufficiency, lymphedema.  There is no open wound.  There is just an overall ooze to the area.  We had an extensive talk about treatment and prognosis.  She has an appointment scheduled with Vascular Surgery for evaluation of her venous system.  This might provide some assistance. In the meantime, we have recommended Coban,  TCA, continuing compression pumps.  We encouraged her to do it at a minimum of twice a day for a minimum of an hour.  We also talked to her about Palliative Care, and at this point, the goal is to maintain improvement any opening in the skin. She is also asking for pain medicine.  We have instructed her that the chronic pain service will have to manage that if in fact she needs narcotics and that we do not want her to be in pain but she needs more than a Tylenol or Aleve can offer on an indefinite basis.  Then, we will have to have her see the Pain Service and she acknowledged understanding that at the last visit as well, so we will see her back in a couple of weeks to change the Coban after that, it will be likely every 3 months for maintenance.     Wayland Denis, DO     CS/MEDQ  D:  06/16/2011  T:  06/16/2011  Job:  [id number]

## 2011-07-07 ENCOUNTER — Encounter (HOSPITAL_BASED_OUTPATIENT_CLINIC_OR_DEPARTMENT_OTHER): Payer: BC Managed Care – PPO | Attending: Plastic Surgery

## 2011-07-07 DIAGNOSIS — I890 Lymphedema, not elsewhere classified: Secondary | ICD-10-CM | POA: Insufficient documentation

## 2011-07-09 ENCOUNTER — Encounter: Payer: Self-pay | Admitting: Surgery

## 2011-07-12 ENCOUNTER — Encounter (INDEPENDENT_AMBULATORY_CARE_PROVIDER_SITE_OTHER): Payer: BC Managed Care – PPO | Admitting: Surgery

## 2011-07-12 ENCOUNTER — Other Ambulatory Visit (INDEPENDENT_AMBULATORY_CARE_PROVIDER_SITE_OTHER): Payer: BC Managed Care – PPO | Admitting: *Deleted

## 2011-07-12 ENCOUNTER — Other Ambulatory Visit: Payer: Self-pay

## 2011-07-12 ENCOUNTER — Encounter: Payer: Self-pay | Admitting: Surgery

## 2011-07-12 ENCOUNTER — Ambulatory Visit (INDEPENDENT_AMBULATORY_CARE_PROVIDER_SITE_OTHER): Payer: BC Managed Care – PPO | Admitting: Surgery

## 2011-07-12 VITALS — BP 105/64 | HR 64 | Resp 20 | Ht 65.0 in | Wt 298.6 lb

## 2011-07-12 DIAGNOSIS — L97209 Non-pressure chronic ulcer of unspecified calf with unspecified severity: Secondary | ICD-10-CM

## 2011-07-12 DIAGNOSIS — I83219 Varicose veins of right lower extremity with both ulcer of unspecified site and inflammation: Secondary | ICD-10-CM

## 2011-07-12 DIAGNOSIS — I872 Venous insufficiency (chronic) (peripheral): Secondary | ICD-10-CM

## 2011-07-12 NOTE — Progress Notes (Signed)
[facility] [place]   Patient name: Robin [name] MRN: [id number] DOB: Dec 19, 1950 Sex: female   Referred by: Dr. Kelly Splinter  Reason for referral: Leg pain and swelling  HISTORY OF PRESENT ILLNESS: This is a 60 year old female I've seen for evaluation of the anus insufficiency. The patient states that her problems began in January of 2011. Her swelling became so problematic for her that she had to quit work. About that time she had a ultrasound at a vein center here in town that showed no significant reflux. She also underwent a CT scan which other than a uterine polyp was unremarkable. Because of the significant swelling in her leg she saw a gastroenterologist who did a complete liver workup which was unremarkable. She's also undergone an unremarkable cardiac workup. She does describe having opened A. Cardura onto her right leg in the fall of 2010 which was slow to heal. She is treated on multiple courses of antibiotics and at the wound center. Cultures have grown out MRSA as well as Klebsiella. She has been on off antibiotics since that time currently she is taking doxycycline for recent bruise to her left leg. The patient has essentially been in compression wraps for over a year due to the inability to heal wounds on her legs. The right leg seems to be the most problematic leg. There is continuous drainage and weeping from the leg which prohibits her from being able to wear compression stockings.  Patient has multiple medical problems which include diabetes hypertension hypercholesterolemia all of which are medically managed. She does take Coumadin for atrial fibrillation.  Past Medical History  Diagnosis Date  . Diabetes mellitus   . Hypertension   . Hyperlipidemia   . Atrial fibrillation   . Sleep apnea   . Cholelithiasis   . CAD (coronary artery disease)     Past Surgical History  Procedure Date  . Tonsillectomy 1970s  . Hemorroidectomy 1990s  . Choledochal  cyst excision 1980s  . Parathyroidectomy     History   Social History  . Marital Status: Married    Spouse Name: N/A    Number of Children: N/A  . Years of Education: N/A   Occupational History  . Not on file.   Social History Main Topics  . Smoking status: Never Smoker   . Smokeless tobacco: Not on file  . Alcohol Use: No  . Drug Use: No  . Sexually Active:    Other Topics Concern  . Not on file   Social History Narrative  . No narrative on file    Family History  Problem Relation Age of Onset  . Hypertension Mother   . Heart disease Father 67    MI  . COPD Father   . Other Brother     heart issues  . Heart disease Brother   . Other Brother     heart issues  . Heart disease Brother   . Drug abuse Brother     Allergies as of 07/12/2011  . (No Known Allergies)    Current Outpatient Prescriptions on File Prior to Visit  Medication Sig Dispense Refill  . buPROPion (WELLBUTRIN XL) 300 MG 24 hr tablet Take 1 tablet by mouth daily.       Marland Kitchen doxycycline (VIBRA-TABS) 100 MG tablet Take 1 tablet by mouth 2 (two) times daily. X 2 weeks      . Furosemide (LASIX PO) Take 40 mg by mouth 2 (two) times daily.       Marland Kitchen  HYDROcodone-acetaminophen (NORCO) 5-325 MG per tablet Take 1 tablet by mouth every 6 (six) hours as needed.       . insulin glargine (LANTUS) 100 UNIT/ML injection Inject 30 Units into the skin at bedtime.       . insulin lispro (HUMALOG) 100 UNIT/ML injection Inject into the skin. Inject 25-30 units with each meal      . losartan-hydrochlorothiazide (HYZAAR) 100-25 MG per tablet Take 1 tablet by mouth daily.       Marland Kitchen METOPROLOL TARTRATE PO Take 1 tablet by mouth 2 (two) times daily.       . potassium chloride SA (K-DUR,KLOR-CON) 20 MEQ tablet Take 1 tablet by mouth daily.       . simvastatin (ZOCOR) 20 MG tablet Take 1 tablet by mouth daily.       . vitamin C (ASCORBIC ACID) 500 MG tablet Take 500 mg by mouth daily.        . Warfarin Sodium (COUMADIN PO) Take  by mouth.        . Zinc Sulfate 220 MG TABS Take 1 tablet by mouth daily.           REVIEW OF SYSTEMS: The patient states she has no chest pain she denies bronchitis and asthma no tarry stools she is complaining of an incisional hernia all of the systems are negative as documented in [place] and was explicitly stated in the history of present illness  PHYSICAL EXAMINATION: General: The patient appears their stated age.  Vital signs are BP 105/64, HR 64, RR 20.  Ht 5'5, wt 298 Pulmonary: There is a good air exchange bilaterally without wheezing or rales. Abdomen: Soft and non-tender, obese  Musculoskeletal: There are no major deformities.  . Neurologic: No focal weakness or paresthesias are detected, and is a he knee. No prominent varicosities are seen. There is weeping of the near total circumference of the right leg from the knee to the ankle. There are multiple areas of skin breakdown or drainage the leg is erythematous. There is a similar but smaller area on the left leg which is very tender and erythematous and weeping Psychiatric: The patient has normal affect. Cardiovascular: There is a regular rate and rhythm without significant murmur appreciated.  Diagnostic Studies: I ordered and reviewed vascular lab studies today which show significant bilateral saphenous vein greater saphenous vein reflux and reflux in the right lesser saphenous vein    Medication Changes: None  Assessment:  Bilateral venous insufficiency with open wounds,CEAP class VI Plan: The patient has been in and out of compression therapy for over a year and a half and has been unable to heal her wounds. She's had multiple infections and continues to have chronic draining lower extremity with active ulcerations in both legs. Ultrasound findings today show significant reflux in both greater saphenous veins as well as the right lesser saphenous vein. I think that she is a good candidate for endovenous laser ablation  of the affected veins to attempt to get her to heal her wounds as well as to help with her edema. She has tried where thigh-high compression stockings in the past however she cannot wear them because of the extensive amount of drainage is coming from her active ulceration. I'll try this and schedule her to see Dr. Hart Rochester the immediate future to consider laser ablation I will leave it to his discretion whether to stop her Coumadin     V. Charlena Cross, M.D. [facility] [place] Office:  [phone]   

## 2011-07-14 NOTE — Progress Notes (Signed)
[facility]  NAME:  Robin [name]             ACCOUNT NO.:  [id number]  MEDICAL RECORD NO.:  [id number]      DATE OF BIRTH:  August 18, 1951  PHYSICIAN:  Wayland Denis, DO       VISIT DATE:  07/14/2011                                  OFFICE VISIT   Robin [name] is a 60 year old female with bilateral lower extremity lymphedema and ulcerations.  They are superficial and actually a bit improved.  She seems to be doing well with the compression, Coban, zinc and TCA.  She says she is feeling a little bit better.  She did see the vascular surgeon and it sounds like there may be some options for intervention.  There has been no other change in her medications. Review of systems is negative except otherwise mentioned.  On exam, she is alert, oriented, cooperative, not in any distress.  Pupils are equal. Extraocular muscles are intact.  No cervical lymphadenopathy.  Breathing is unlabored.  Heart is regular.  The wounds are as described in the nurse's note and looking a little bit better than they were on the last visit.  We will continue with compression but we will use Unna boot and have her follow up next week.     Wayland Denis, DO     CS/MEDQ  D:  07/14/2011  T:  07/14/2011  Job:  [id number]

## 2011-07-19 NOTE — Procedures (Unsigned)
LOWER EXTREMITY VENOUS REFLUX EXAM  INDICATION:  Lower extremity ulcers.  EXAM:  Using color-flow imaging and pulse Doppler spectral analysis, the right and left common femoral, superficial femoral, popliteal, posterior tibial, greater and lesser saphenous veins are evaluated.  There is evidence suggesting deep venous insufficiency in the left lower extremity.  The right and left saphenofemoral junction is not competent with Reflux of >55milliseconds. The right and left GSV's are not competent with Reflux of >564milliseconds with the caliber as described below.   The right proximal short saphenous vein demonstrates incompetency.  GSV Diameter (used if found to be incompetent only)                                           Right    Left Proximal Greater Saphenous Vein           0.71 cm  0.87 cm Proximal-to-mid-thigh                     0.67 cm  0.67 cm Mid thigh                                 0.68 cm  0.56 cm Mid-distal thigh                          cm       cm Distal thigh                              0.56 cm  0.33 cm Knee                                      0.56 cm  0.38 cm  IMPRESSION: 1. The right and left greater saphenous veins are not competent with     reflux >548milliseconds. 2. The right and left greater saphenous veins are not tortuous. 3. The left deep venous system is not competent with reflux of     >554milliseconds. 4. The right small saphenous vein is not competent with reflux of     >58milliseconds.  ___________________________________________ Quita Skye Hart Rochester, M.D.  EM/MEDQ  D:  07/12/2011  T:  07/12/2011  Job:  [id number]

## 2011-07-21 ENCOUNTER — Telehealth: Payer: Self-pay | Admitting: *Deleted

## 2011-07-21 NOTE — Telephone Encounter (Signed)
Spoke with Alfonse Ras, supervisor of [facility], to coordinate coumadin management prior to office surgery (laser ablation) scheduled for August 02, 2011.  Mrs. Dockstader is to have 3 laser ablation procedures scheduled on 3 different days of service.  Riki Rusk Smart states that Dr. Mayford Knife wants laser ablation procedures scheduled every 4 weeks.  Notified Riki Rusk that Dr. Hart Rochester wants Mrs. Hobart off Coumadin 5 days prior to each procedure.  Jiselle Sheu, Neena Rhymes

## 2011-07-21 NOTE — Telephone Encounter (Signed)
Left telephone voice message that Dr. Mayford Knife wants laser ablation procedures scheduled every 4 weeks and that Dr. Hart Rochester wants her off Coumadin for 5 days prior to laser ablation procedures.  Instructed her to not take Coumadin Wed. Oct.24- Monday Oct. 29.  Conveyed that I had spoken with Alfonse Ras of [facility] to coordinate her Coumadin management.  Sent letter to her home with this information as well. Shy Guallpa, Neena Rhymes

## 2011-07-30 ENCOUNTER — Encounter: Payer: Self-pay | Admitting: Vascular Surgery

## 2011-08-02 ENCOUNTER — Encounter: Payer: Self-pay | Admitting: Vascular Surgery

## 2011-08-02 ENCOUNTER — Ambulatory Visit (INDEPENDENT_AMBULATORY_CARE_PROVIDER_SITE_OTHER): Payer: BC Managed Care – PPO | Admitting: Vascular Surgery

## 2011-08-02 ENCOUNTER — Telehealth: Payer: Self-pay | Admitting: *Deleted

## 2011-08-02 VITALS — BP 111/46 | HR 78 | Resp 20 | Ht 66.0 in | Wt 296.0 lb

## 2011-08-02 DIAGNOSIS — I83893 Varicose veins of bilateral lower extremities with other complications: Secondary | ICD-10-CM

## 2011-08-02 DIAGNOSIS — I83219 Varicose veins of right lower extremity with both ulcer of unspecified site and inflammation: Secondary | ICD-10-CM

## 2011-08-02 NOTE — Telephone Encounter (Signed)
Patient called in just and hour after her laser ablation. She was wrapped with an [employer] and is experiencing severe pain at the area where her ulcer is located. At the Wound Center they place extra medication and a dressing around the ulcer which creates a space between he leg and the wrap. We don't apply [employer] that way. She asked if she could go to the Wound Center and have them apply another wrap. I told her that would be fine. I will call her in the am to check on her.

## 2011-08-02 NOTE — Progress Notes (Signed)
Subjective:     Patient ID: Robin [name], female   DOB: Sep 21, 1951, 60 y.o.   MRN: [id number]  HPI 60 year old female had laser ablation of her right great saphenous vein performed under local tumescent anesthesia tolerated the procedure well. He had gross reflux in the right great saphenous vein with stasis ulceration of the right lower extremity. She also has reflux in the right small saphenous vein and left great saphenous vein. 18 J were utilized today for her treatment.   Review of Systems     Objective:   Physical Exam blood pressure 111/46 heart rate 78 respirations    Assessment:     Successful laser ablation right great saphenous vein with gross reflux with stasis ulceration    Plan:     #1 return one week for venous duplex exam to confirm closure right greater saphenous vein #2 return to wound Center Wednesday of this week for continued dressing changes #3 resume Coumadin therapy today #4   elevate legs as much as possible when not ambulating

## 2011-08-03 ENCOUNTER — Telehealth: Payer: Self-pay | Admitting: *Deleted

## 2011-08-03 ENCOUNTER — Other Ambulatory Visit: Payer: Self-pay | Admitting: *Deleted

## 2011-08-03 DIAGNOSIS — I83893 Varicose veins of bilateral lower extremities with other complications: Secondary | ICD-10-CM

## 2011-08-03 NOTE — Telephone Encounter (Signed)
08/03/2011  Time: 12:20 PM   Patient Name: Robin [name]  Patient QI:ONGEXB  Procedure:36478 R GSV   Comments/Actions Taken: Reached patient at home. Had a good night. Some soreness, dull ache. Using ice and Tylenol (unable to take Ibuprofen). Unwrapped her unna boot herself instead of going to the wound center. After loosening the wrap a little and re-wrapping, her pain went away. Patient in good spirits. Reminded her of her follow up visit next week.

## 2011-08-05 ENCOUNTER — Encounter (HOSPITAL_BASED_OUTPATIENT_CLINIC_OR_DEPARTMENT_OTHER): Payer: BC Managed Care – PPO | Attending: Plastic Surgery

## 2011-08-05 DIAGNOSIS — I890 Lymphedema, not elsewhere classified: Secondary | ICD-10-CM | POA: Insufficient documentation

## 2011-08-06 NOTE — Progress Notes (Signed)
This encounter was created in error - please disregard.

## 2011-08-09 ENCOUNTER — Encounter: Payer: Self-pay | Admitting: Vascular Surgery

## 2011-08-10 ENCOUNTER — Ambulatory Visit (INDEPENDENT_AMBULATORY_CARE_PROVIDER_SITE_OTHER): Payer: BC Managed Care – PPO | Admitting: Vascular Surgery

## 2011-08-10 ENCOUNTER — Ambulatory Visit (INDEPENDENT_AMBULATORY_CARE_PROVIDER_SITE_OTHER): Payer: BC Managed Care – PPO

## 2011-08-10 ENCOUNTER — Encounter: Payer: Self-pay | Admitting: Vascular Surgery

## 2011-08-10 VITALS — BP 107/66 | HR 71 | Resp 16 | Ht 66.0 in | Wt 305.0 lb

## 2011-08-10 DIAGNOSIS — L97909 Non-pressure chronic ulcer of unspecified part of unspecified lower leg with unspecified severity: Secondary | ICD-10-CM

## 2011-08-10 DIAGNOSIS — Z48812 Encounter for surgical aftercare following surgery on the circulatory system: Secondary | ICD-10-CM

## 2011-08-10 DIAGNOSIS — I83009 Varicose veins of unspecified lower extremity with ulcer of unspecified site: Secondary | ICD-10-CM

## 2011-08-10 DIAGNOSIS — I83219 Varicose veins of right lower extremity with both ulcer of unspecified site and inflammation: Secondary | ICD-10-CM

## 2011-08-10 NOTE — Progress Notes (Signed)
Subjective:     Patient ID: Robin [name], female   DOB: 12-14-1950, 60 y.o.   MRN: [id number]  HPI this 60 year old female had laser ablation of her right great saphenous vein one week ago for an extensive stasis ulcer in the right leg she tolerated the procedure well. She returns today. She did have some mild-to-moderate discomfort from the groin to the mid thigh area for a few days. She had no change in her distal edema. She is being seen in the [facility] wound Center every other day. Dressing will be changed tomorrow. She has resumed her Coumadin therapy.   Review of Systems she denies chest pain, dyspnea on exertion, PND, orthopnea, hemoptysis    Objective:   Physical Exam blood pressure 107/66 heart rate 71 respirations 16 Gen. he is obese female is in no apparent distress alert and oriented x3 Lower extremity exam on the right reveals a dressing in place from the knee to the foot where the ulceration is located. There is some mild ecchymosis in the right medial thigh. There is mild tenderness along the course of the great saphenous vein on the right.  Today I ordered a venous duplex exam which I reviewed and interpreted. There is no DVT on the right leg. There is successful ablation right great saphenous vein from the knee to the saphenofemoral junction.     Assessment:    successful ablation right great saphenous vein with extensive stasis ulceration right lower extremity    Plan:     Stop Coumadin after November 20 We'll perform laser ablation right small saphenous vein on Monday, November 26 Continue 3 times a week dressing changes at [facility] long wound Center

## 2011-08-17 NOTE — Procedures (Unsigned)
DUPLEX DEEP VENOUS EXAM - LOWER EXTREMITY  INDICATION:  Status post laser ablation of the right great saphenous vein on 08/02/11.  HISTORY:  Edema: Trauma/Surgery: Pain: PE: Previous DVT: Anticoagulants: Other:  DUPLEX EXAM:               CFV   SFV   PopV  PTV    GSV               R  L  R  L  R  L  R   L  R  L Thrombosis    o     o     o            + Spontaneous   +     +     +            o Phasic        +     +     + Augmentation  +     +     +            O Compressible  +     +     +            o Competent  Legend:  + - yes  o - no  p - partial  D - decreased  IMPRESSION: 1. No evidence of right lower extremity deep venous thrombosis. 2. Successful ablation of the right great saphenous vein from the     proximal to the distal thigh.   _____________________________ Quita Skye. Hart Rochester, M.D.  CI/MEDQ  D:  08/10/2011  T:  08/10/2011  Job:  [id number]

## 2011-08-20 ENCOUNTER — Other Ambulatory Visit: Payer: Self-pay | Admitting: Family Medicine

## 2011-08-20 DIAGNOSIS — R7989 Other specified abnormal findings of blood chemistry: Secondary | ICD-10-CM

## 2011-08-23 ENCOUNTER — Ambulatory Visit
Admission: RE | Admit: 2011-08-23 | Discharge: 2011-08-23 | Disposition: A | Discharge status: Home / Self Care | Payer: BC Managed Care – PPO | Source: Ambulatory Visit | Attending: Family Medicine | Admitting: Family Medicine

## 2011-08-23 DIAGNOSIS — R7989 Other specified abnormal findings of blood chemistry: Secondary | ICD-10-CM

## 2011-08-23 DIAGNOSIS — IMO0002 Reserved for concepts with insufficient information to code with codable children: Secondary | ICD-10-CM

## 2011-08-25 ENCOUNTER — Encounter: Payer: Self-pay | Admitting: Vascular Surgery

## 2011-08-30 ENCOUNTER — Ambulatory Visit (INDEPENDENT_AMBULATORY_CARE_PROVIDER_SITE_OTHER): Payer: BC Managed Care – PPO | Admitting: Vascular Surgery

## 2011-08-30 ENCOUNTER — Encounter: Payer: Self-pay | Admitting: Vascular Surgery

## 2011-08-30 ENCOUNTER — Other Ambulatory Visit: Payer: Self-pay | Admitting: *Deleted

## 2011-08-30 VITALS — BP 118/50 | HR 68 | Resp 18 | Ht 66.0 in | Wt 298.0 lb

## 2011-08-30 DIAGNOSIS — I83893 Varicose veins of bilateral lower extremities with other complications: Secondary | ICD-10-CM

## 2011-08-30 NOTE — Progress Notes (Signed)
Laser Ablation Procedure      Date: 08/30/2011    Robin [name] DOB:02/20/1951  Consent signed: Yes  Surgeon:J.D. Hart Rochester  Procedure: Laser Ablation: right Small Saphenous Vein  BP 118/50  Pulse 68  Resp 18  Ht 5\' 6"  (1.676 m)  Wt 298 lb (135.172 kg)  BMI 48.10 kg/m2  Start time: 11:40AM   End time: 12:30PM  Tumescent Anesthesia: 225 cc 0.9% NaCl with 50 cc Lidocaine HCL with 1% Epi and 15 cc 8.4% NaHCO3  Local Anesthesia: 6 cc Lidocaine HCL and NaHCO3 (ratio 2:1)  Pulsed Mode  15 Watts   1 second  1 pulse  Total Pulses 70  Total Energy 1045 Joules  Total Time  1:09       Patient tolerated procedure well: Yes    Description of Procedure:  After marking the course of the saphenous vein and the secondary varicosities in the standing position, the patient was placed on the operating table in the prone position, and right leg was prepped and draped in sterile fashion. Local anesthetic was administered, and under ultrasound guidance the saphenous vein was accessed with a micro needle and guide wire; then the micro puncture sheath was placed. A guide wire was inserted to the saphenopopliteal junction, followed by a 5 french sheath.  The position of the sheath and then the laser fiber below the junction was confirmed using the ultrasound and visualization of the aiming beam.  Tumescent anesthesia was administered along the course of the saphenous vein using ultrasound guidance. Protective laser glasses were placed on the patient, and the laser was fired at 15 watt pulsed mode.  For a total of 1045 joules.  A steri strip was applied to the puncture site.  ABD pads and thigh high compression stockings were applied and Unna Boots bilaterally were applied Blood loss was less than 15 cc.  The patient ambulated out of the operating room having tolerated the procedure well.

## 2011-08-31 ENCOUNTER — Telehealth: Payer: Self-pay | Admitting: *Deleted

## 2011-08-31 NOTE — Progress Notes (Signed)
[facility]  NAME:  Robin, [name]                  ACCOUNT NO.:  MEDICAL RECORD NO.:  [id number]      DATE OF BIRTH:  09/13/1951  PHYSICIAN:  Wayland Denis, DO       VISIT DATE:  08/25/2011                                  OFFICE VISIT   HISTORY:  Robin [name] is here for followup on a right lower extremity lymphedema.  The skin actually looks quite a bit better, but she is still not wearing the bilateral compression, but she has just came out of the Unna boot on the left side, and she has an appointment with Vascular for ablation on the right.  There has been no other change in her medications or social history.  PHYSICAL EXAMINATION:  GENERAL:  She is alert, oriented, cooperative, not in any acute distress.  She is pleasant. HEENT:  Pupils are equal.  Extraocular muscles are intact.  No cervical lymphadenopathy. LUNGS:  Breathing is unlabored. HEART:  Regular. ABDOMEN:  Soft.  PLAN:  The wounds are as described.  We will continue with right-sided zinc, PCA, Unna Boot, Kerlix, Coban, and compression on the left, and follow up in a week.     Wayland Denis, DO     CS/MEDQ  D:  08/25/2011  T:  08/25/2011  Job:  [id number]

## 2011-08-31 NOTE — Telephone Encounter (Signed)
Unable to reach patient at home. Left her a message to call me back. Asked if she is having any swelling above the [employer] and offered that she come in today if we need to rewrap her.

## 2011-08-31 NOTE — Telephone Encounter (Signed)
Patient returned my call. She is not experiencing increased swelling above the [employer]. She will go to the Wound Center on Thurs. She states that she is feeling well. No concerns. reminded her of her fu appts.

## 2011-09-03 ENCOUNTER — Encounter: Payer: Self-pay | Admitting: Vascular Surgery

## 2011-09-06 ENCOUNTER — Other Ambulatory Visit: Payer: BC Managed Care – PPO

## 2011-09-06 ENCOUNTER — Ambulatory Visit: Payer: BC Managed Care – PPO | Admitting: Vascular Surgery

## 2011-09-07 ENCOUNTER — Other Ambulatory Visit (INDEPENDENT_AMBULATORY_CARE_PROVIDER_SITE_OTHER): Payer: BC Managed Care – PPO | Admitting: *Deleted

## 2011-09-07 DIAGNOSIS — Z48812 Encounter for surgical aftercare following surgery on the circulatory system: Secondary | ICD-10-CM

## 2011-09-07 DIAGNOSIS — I83893 Varicose veins of bilateral lower extremities with other complications: Secondary | ICD-10-CM

## 2011-09-08 ENCOUNTER — Encounter (HOSPITAL_BASED_OUTPATIENT_CLINIC_OR_DEPARTMENT_OTHER): Payer: BC Managed Care – PPO | Attending: Plastic Surgery

## 2011-09-08 DIAGNOSIS — I872 Venous insufficiency (chronic) (peripheral): Secondary | ICD-10-CM | POA: Insufficient documentation

## 2011-09-08 DIAGNOSIS — L97809 Non-pressure chronic ulcer of other part of unspecified lower leg with unspecified severity: Secondary | ICD-10-CM | POA: Insufficient documentation

## 2011-09-08 DIAGNOSIS — I890 Lymphedema, not elsewhere classified: Secondary | ICD-10-CM | POA: Insufficient documentation

## 2011-09-08 NOTE — Progress Notes (Signed)
[facility]  NAME:  Robin [name], Robin [name] NO.:  [id number]  MEDICAL RECORD NO.:  [id number]      DATE OF BIRTH:  [birth year]  PHYSICIAN:  Wayland Denis, DO       VISIT DATE:  09/08/2011                                  OFFICE VISIT   HISTORY:  Robin [name] is a 60 year old female, who is here for followup on bilateral lower extremity ulcers.  She had ablation done to her right lower extremity and she states she was using [employer].  When the [employer] got touch her, she took it off and used [employer] or Always Pads on the area.  There is some concern that this may be causing a contact dermatitis as it is very well demarcated, very raw, and I would describe it as a superficial-to-superficial deep bur.  There is no other change in her medications or social history.  Review of systems is otherwise negative.  PHYSICAL EXAMINATION:  GENERAL:  She is alert, oriented, cooperative, not in any acute distress.  She is pleasant. HEENT:  Pupils are equal.  Extraocular muscles are intact.  No cervical lymphadenopathy.  No breathing difficulty. HEART:  Regular. The wound is as described above and in the nurse's note.  PLAN:  I am going to switch her to Silvadene gauze and Kerlix, to have change it daily and see if we can get this turned around.  We might have to had antibiotics and I also may need to have her see a dermatologist if this does not make some changes.  The other option is doing a biopsy, but I am going to give her a week and have her follow up at that time.     Wayland Denis, DO     CS/MEDQ  D:  09/08/2011  T:  09/08/2011  Job:  [id number]

## 2011-09-09 ENCOUNTER — Encounter (HOSPITAL_BASED_OUTPATIENT_CLINIC_OR_DEPARTMENT_OTHER): Payer: BC Managed Care – PPO

## 2011-09-15 NOTE — Progress Notes (Signed)
[facility]  NAME:  Robin [name], Robin [name] NO.:  [id number]  MEDICAL RECORD NO.:  [id number]      DATE OF BIRTH:  Sep 28, 1951  PHYSICIAN:  Wayland Denis, DO            VISIT DATE:                                  OFFICE VISIT   HISTORY:  Robin [name] is a 60 year old white female, who is being followed for bilateral lower extremity lymphedema with ulceration.  This past week, we used Silvadene to get things under control because her skin was so irritated.  So, she did not use the pumps this week to try and decrease the amount of seaweed irritation and they do seem to have improved.  There has been no change in her medications, social history, or review of systems.  PHYSICAL EXAMINATION:  GENERAL:  She is alert, oriented, cooperative, not in any acute distress.  She is pleasant. HEENT:  Pupils are equal.  Extraocular muscles are intact. NECK:  No cervical lymphadenopathy. LUNGS:  Her breathing is unlabored. HEART:  Regular.  Her wounds are as described.  We will continue with another week of Silvadene, but would like her to reinitiate the compression to help with the lymphedema aspect and we will see her back in 1 week.     Wayland Denis, DO     CS/MEDQ  D:  09/15/2011  T:  09/15/2011  Job:  [id number]

## 2011-09-20 ENCOUNTER — Other Ambulatory Visit: Payer: Self-pay | Admitting: *Deleted

## 2011-09-20 ENCOUNTER — Other Ambulatory Visit: Payer: BC Managed Care – PPO | Admitting: Vascular Surgery

## 2011-09-20 ENCOUNTER — Encounter: Payer: Self-pay | Admitting: Vascular Surgery

## 2011-09-20 DIAGNOSIS — I83893 Varicose veins of bilateral lower extremities with other complications: Secondary | ICD-10-CM

## 2011-09-20 NOTE — Procedures (Unsigned)
DUPLEX DEEP VENOUS EXAM - LOWER EXTREMITY  INDICATION:  One week status post right small saphenous vein ablation.  HISTORY: Edema:  Yes. Trauma/Surgery:  Great saphenous and small saphenous vein ablations. Pain:  Yes. PE:  No. Previous DVT:  No. Anticoagulants:  Coumadin. Other:  DUPLEX EXAM:               CFV   SFV   PopV  PTV    GSV               R  L  R  L  R  L  R   L  R  L Thrombosis                o Spontaneous               + Phasic                    + Augmentation              + Compressible              + Competent  Legend:  + - yes  o - no  p - partial  D - decreased  IMPRESSION: 1. Limited study due to vessel depth and wound to dressings. 2. Right proximal small saphenous vein appears ablated without     popliteal involvement.   _____________________________ Quita Skye. Hart Rochester, M.D.  LT/MEDQ  D:  09/07/2011  T:  09/07/2011  Job:  [id number]

## 2011-09-23 NOTE — Progress Notes (Signed)
[facility]  NAME:  Robin [name], Robin [name]             ACCOUNT NO.:  [id number]  MEDICAL RECORD NO.:  [id number]      DATE OF BIRTH:  Jan 31, 1951  PHYSICIAN:  Wayland Denis, DO       VISIT DATE:  09/22/2011                                  OFFICE VISIT   Robin [name] is a 60 year old white female who is here for followup on bilateral lower extremity ulceration, lymphedema, and chronic venous insufficiency.  It appeared that she had a reaction to something she was using on her skin, either a sanitary pad or a cream, and so we treated her with Silvadene and she has done remarkably well on the right side. Unfortunately, the left side has gotten worse but it is probably a progression from what was already happening.  The ablation was on the right side.  There has been no other change in her medications.  Review of systems is otherwise negative.  On exam, she is alert, oriented, cooperative, not in any acute distress, very pleased as the pain is remarkably better.  Pupils are equal. Extraocular muscles are intact.  No cervical lymphadenopathy.  Breathing is unlabored.  Her heart is regular.  Her abdomen is large but soft.  No tenderness.  The wounds are as described noted in the nurse's note.  We will continue with Silvadene wraps and have her follow up in 1 week.     Wayland Denis, DO     CS/MEDQ  D:  09/22/2011  T:  09/23/2011  Job:  [id number]

## 2011-09-27 ENCOUNTER — Other Ambulatory Visit: Payer: BC Managed Care – PPO

## 2011-09-27 ENCOUNTER — Ambulatory Visit: Payer: BC Managed Care – PPO | Admitting: Vascular Surgery

## 2011-10-01 ENCOUNTER — Encounter: Payer: Self-pay | Admitting: Vascular Surgery

## 2011-10-13 ENCOUNTER — Encounter (HOSPITAL_BASED_OUTPATIENT_CLINIC_OR_DEPARTMENT_OTHER): Payer: BC Managed Care – PPO

## 2011-10-15 ENCOUNTER — Encounter: Payer: Self-pay | Admitting: Vascular Surgery

## 2011-10-18 ENCOUNTER — Encounter: Payer: Self-pay | Admitting: Vascular Surgery

## 2011-10-18 ENCOUNTER — Ambulatory Visit (INDEPENDENT_AMBULATORY_CARE_PROVIDER_SITE_OTHER): Payer: BC Managed Care – PPO | Admitting: Vascular Surgery

## 2011-10-18 VITALS — BP 112/47 | HR 67 | Resp 18 | Ht 66.0 in | Wt 296.0 lb

## 2011-10-18 DIAGNOSIS — I83893 Varicose veins of bilateral lower extremities with other complications: Secondary | ICD-10-CM | POA: Insufficient documentation

## 2011-10-18 NOTE — Progress Notes (Signed)
Subjective:     Patient ID: Robin [name], female   DOB: Apr 05, 1951, 61 y.o.   MRN: [id number]  HPI patient had laser ablation of left great saphenous vein done under local tumescent anesthesia today and tolerated the procedure well. This is done for severe venous hypertension with stasis changes in the distal scan and severe edema. 1335 J of energy was utilized.   Review of Systems     Objective:   Physical ExamBP 112/47  Pulse 67  Resp 18  Ht 5\' 6"  (1.676 m)  Wt 296 lb (134.265 kg)  BMI 47.78 kg/m2     Assessment:    well-tolerated laser ablation left greater saphenous vein for severe venous hypertension    Plan:     Return in one week for venous duplex exam to confirm closure left great saphenous vein

## 2011-10-18 NOTE — Progress Notes (Signed)
Laser Ablation Procedure      Date: 10/18/2011    Robin [name] DOB:1951-05-01  Consent signed: Yes  Surgeon:J.D. Hart Rochester  Procedure: Laser Ablation: left Greater Saphenous Vein  BP 112/47  Pulse 67  Resp 18  Ht 5\' 6"  (1.676 m)  Wt 296 lb (134.265 kg)  BMI 47.78 kg/m2  Start time: 11:25   End time: 12:10  Tumescent Anesthesia: 300 cc 0.9% NaCl with 50 cc Lidocaine HCL with 1% Epi and 15 cc 8.4% NaHCO3  Local Anesthesia: 6 cc Lidocaine HCL and NaHCO3 (ratio 2:1)  Pulsed mode:15 watts,1 sec,1 pulse; total pulses 93,1355 total energy, 1:30 total time.       Patient tolerated procedure well: Yes    Description of Procedure:  After marking the course of the saphenous vein and the secondary varicosities in the standing position, the patient was placed on the operating table in the supine position, and the left leg was prepped and draped in sterile fashion. Local anesthetic was administered, and under ultrasound guidance the saphenous vein was accessed with a micro needle and guide wire; then the micro puncture sheath was placed. A guide wire was inserted to the saphenofemoral junction, followed by a 5 french sheath.  The position of the sheath and then the laser fiber below the junction was confirmed using the ultrasound and visualization of the aiming beam.  Tumescent anesthesia was administered along the course of the saphenous vein using ultrasound guidance. Protective laser glasses were placed on the patient, and the laser was fired at 15 watt pulsed mode advancing 1-2 mm per sec.  For a total of 1355 joules.  A steri strip was applied to the puncture site.   Blood loss was less than 15 cc.  The patient ambulated out of the operating room having tolerated the procedure well.

## 2011-10-19 ENCOUNTER — Telehealth: Payer: Self-pay | Admitting: *Deleted

## 2011-10-19 NOTE — Telephone Encounter (Signed)
10/19/2011  Time: 11:15 AM   Patient Name: Robin [name]  Patient of: J.D. Hart Rochester  Procedure:Laser Ablation left  Reached patient at home and checked  Her status  Yes    Comments/Actions Taken: Patient doing well. No pain. Discussed follow up appt for next week.    @SIGNATURE @

## 2011-10-20 ENCOUNTER — Encounter: Payer: Self-pay | Admitting: Vascular Surgery

## 2011-10-22 ENCOUNTER — Encounter: Payer: Self-pay | Admitting: Vascular Surgery

## 2011-10-25 ENCOUNTER — Encounter: Payer: Self-pay | Admitting: Vascular Surgery

## 2011-10-25 ENCOUNTER — Ambulatory Visit (INDEPENDENT_AMBULATORY_CARE_PROVIDER_SITE_OTHER): Payer: BC Managed Care – PPO | Admitting: Vascular Surgery

## 2011-10-25 ENCOUNTER — Other Ambulatory Visit: Payer: BC Managed Care – PPO

## 2011-10-25 ENCOUNTER — Other Ambulatory Visit (INDEPENDENT_AMBULATORY_CARE_PROVIDER_SITE_OTHER): Payer: BC Managed Care – PPO | Admitting: *Deleted

## 2011-10-25 ENCOUNTER — Ambulatory Visit: Payer: BC Managed Care – PPO | Admitting: Vascular Surgery

## 2011-10-25 VITALS — BP 128/46 | HR 58 | Resp 18 | Ht 66.0 in | Wt 296.0 lb

## 2011-10-25 DIAGNOSIS — I83009 Varicose veins of unspecified lower extremity with ulcer of unspecified site: Secondary | ICD-10-CM | POA: Insufficient documentation

## 2011-10-25 DIAGNOSIS — I83893 Varicose veins of bilateral lower extremities with other complications: Secondary | ICD-10-CM

## 2011-10-25 DIAGNOSIS — Z48812 Encounter for surgical aftercare following surgery on the circulatory system: Secondary | ICD-10-CM

## 2011-10-25 DIAGNOSIS — L97909 Non-pressure chronic ulcer of unspecified part of unspecified lower leg with unspecified severity: Secondary | ICD-10-CM

## 2011-10-25 NOTE — Progress Notes (Signed)
Subjective:     Patient ID: Robin [name], female   DOB: 08/20/1951, 61 y.o.   MRN: [id number]  HPI this patient returns today following laser ablation of left great saphenous vein performed one week ago under local tumescent anesthesia she has extensive superficial inflammation of the left leg secondary to venous hypertension. She states she has R. didn't notice that the left leg is much less tight than prior to the ablation. She had a similar experience with the right leg was performed a few months ago he is seen on a regular basis in the wound center at [facility].  Past Medical History  Diagnosis Date  . Diabetes mellitus   . Hypertension   . Hyperlipidemia   . Atrial fibrillation   . Sleep apnea   . Cholelithiasis   . CAD (coronary artery disease)     History  Substance Use Topics  . Smoking status: Never Smoker   . Smokeless tobacco: Never Used  . Alcohol Use: No    Family History  Problem Relation Age of Onset  . Hypertension Mother   . Heart disease Father 33    MI  . COPD Father   . Other Brother     heart issues  . Heart disease Brother   . Other Brother     heart issues  . Heart disease Brother   . Drug abuse Brother     No Known Allergies  Current outpatient prescriptions:buPROPion (WELLBUTRIN XL) 300 MG 24 hr tablet, Take 1 tablet by mouth daily. , Disp: , Rfl: ;  doxycycline (VIBRA-TABS) 100 MG tablet, Take 1 tablet by mouth 2 (two) times daily. X 2 weeks, Disp: , Rfl: ;  Furosemide (LASIX PO), Take 40 mg by mouth daily. , Disp: , Rfl: ;  HYDROcodone-acetaminophen (NORCO) 5-325 MG per tablet, Take 1 tablet by mouth every 6 (six) hours as needed. , Disp: , Rfl:  insulin glargine (LANTUS) 100 UNIT/ML injection, Inject 30 Units into the skin at bedtime. , Disp: , Rfl: ;  insulin lispro (HUMALOG) 100 UNIT/ML injection, Inject into the skin. Inject 25-30 units with each meal, Disp: , Rfl: ;  losartan-hydrochlorothiazide (HYZAAR) 100-25 MG per tablet,  Take 1 tablet by mouth daily. , Disp: , Rfl: ;  METOPROLOL TARTRATE PO, Take 1 tablet by mouth 2 (two) times daily. , Disp: , Rfl:  potassium chloride SA (K-DUR,KLOR-CON) 20 MEQ tablet, Take 1 tablet by mouth daily. , Disp: , Rfl: ;  simvastatin (ZOCOR) 20 MG tablet, Take 1 tablet by mouth daily. , Disp: , Rfl: ;  vitamin C (ASCORBIC ACID) 500 MG tablet, Take 500 mg by mouth daily.  , Disp: , Rfl: ;  Warfarin Sodium (COUMADIN PO), Take by mouth. Last dose of Coumadin 07-28-2011 in preparation for endovenous laser ablation., Disp: , Rfl:  Zinc Sulfate 220 MG TABS, Take 1 tablet by mouth daily.  , Disp: , Rfl:   BP 128/46  Pulse 58  Resp 18  Ht 5\' 6"  (1.676 m)  Wt 296 lb (134.265 kg)  BMI 47.78 kg/m2  Body mass index is 47.78 kg/(m^2).        Review of Systems she denies chest pain, dyspnea on exertion, PND, orthopnea, or hemoptysis.     Objective:   Physical Exam blood pressure 120/46 heart rate 58 respirations 18 General obese female in no apparent distress alert and oriented x3 Lungs no rhonchi or wheezing Lower extremity exam on the left reveals mild tenderness along the course  of the left great saphenous vein from the distal thigh the saphenofemoral junction. She has diffuse edema. She does have a dressing in place from the knee to the ankle. She has a 3+ dorsalis pedis pulse palpable in the left leg.  Today I ordered a venous duplex exam which are reviewed and interpreted. The great saphenous vein is successfully ablated from the distal thigh to near the saphenofemoral junction with no DVT     Assessment:     Successful laser ablation bilateral great saphenous veins for severe venous insufficiency with ulceration    Plan:     We'll resume treatment at [facility] long wound center. Return to see [place] on when necessary basis

## 2011-11-01 NOTE — Procedures (Unsigned)
DUPLEX DEEP VENOUS EXAM - LOWER EXTREMITY  INDICATION:  One week status post EVLT.  HISTORY:  Edema:  Yes Trauma/Surgery:  Left GSV ablation Pain:  Yes PE:  No Previous DVT:  No Anticoagulants: Other:  DUPLEX EXAM:               CFV   SFV   PopV  PTV    GSV               R  L  R  L  R  L  R   L  R  L Thrombosis       0     0     0            + Spontaneous      +     +     +            o Phasic           +     +     +            o Augmentation     +     +     +            o Compressible     +     +     +            0 Competent  Legend:  + - yes  o - no  p - partial  D - decreased  IMPRESSION:  Successful ablation of left great saphenous vein from 8 cm distal to the junction through the distal insertion point in the thigh. The saphenofemoral junction is patent with reflux via branches.  Calf veins were not evaluated due to wound dressings.   _____________________________ Quita Skye Hart Rochester, M.D.  LT/MEDQ  D:  10/25/2011  T:  10/25/2011  Job:  [id number]

## 2011-11-03 ENCOUNTER — Encounter (HOSPITAL_BASED_OUTPATIENT_CLINIC_OR_DEPARTMENT_OTHER): Payer: BC Managed Care – PPO | Attending: Plastic Surgery

## 2011-11-03 DIAGNOSIS — I890 Lymphedema, not elsewhere classified: Secondary | ICD-10-CM | POA: Insufficient documentation

## 2011-11-03 NOTE — Progress Notes (Signed)
[facility]  NAME:  Robin [name], Robin [name]             ACCOUNT NO.:  [id number]  MEDICAL RECORD NO.:  [id number]      DATE OF BIRTH:  Sep 06, 1951  PHYSICIAN:  Wayland Denis, DO       VISIT DATE:  11/03/2011                                  OFFICE VISIT   Robin [name] is a 61 year old female with bilateral lower extremity lymphedema.  She has been using Silvadene and alginate over the past several weeks.  There is redness again on both sides with some oozing on the right side.  She had an ablation on the left, so we will wait to see if we see some changes with that.  It is possible that she is having a sensitive reaction to the Silvadene or even to the alginate.  She does use the pumps some at home, but it does not sound like its 3 times a day.  We have encouraged her to do that.  On exam, she is alert, oriented, cooperative, not in any acute distress. She is pleasant.  Her pupils are equal.  Extraocular muscles are intact. No cervical lymphadenopathy.  Breathing is unlabored.  Her heart is regular.  Her abdomen is soft.  She has superficial wounds and are oozing in the bilateral lower extremities, worse on the right at present with some swelling. Recommend changing to the TCA with the pumps.  Doing an Radio broadcast assistant on the right and Kerlix, Coban wrap on the left.  To see if the Silvadene is causing any reaction.  Certainly, I encourage elevation, multivitamin, and followup in 1 week.     Wayland Denis, DO     CS/MEDQ  D:  11/03/2011  T:  11/03/2011  Job:  [id number]

## 2011-11-10 ENCOUNTER — Encounter (HOSPITAL_BASED_OUTPATIENT_CLINIC_OR_DEPARTMENT_OTHER): Payer: BC Managed Care – PPO | Attending: Plastic Surgery

## 2011-11-10 DIAGNOSIS — I872 Venous insufficiency (chronic) (peripheral): Secondary | ICD-10-CM | POA: Insufficient documentation

## 2011-11-10 DIAGNOSIS — I890 Lymphedema, not elsewhere classified: Secondary | ICD-10-CM | POA: Insufficient documentation

## 2011-11-11 NOTE — Progress Notes (Signed)
[facility]  NAME:  EARLYNE, FEESER             ACCOUNT NO.:  [id number]  MEDICAL RECORD NO.:  [id number]      DATE OF BIRTH:  1950-10-29  PHYSICIAN:  Wayland Denis, DO       VISIT DATE:  11/10/2011                                  OFFICE VISIT   Ms. Gundlach is a 61 year old female who is here for followup on her bilateral chronic venous insufficiency with lymphedema.  She has had an Unna boot on the right and Kerlix, Coban on the left.  The Unna boot lasted 3 days and the Kerlix, Coban lasted about 5 days.  They got very saturated, she was concerned about infection, so she removed them but overall, they look better, particularly on the right with less redness and swelling and drainage.  There has been no change in her medications.  PHYSICAL EXAMINATION:  GENERAL:  She is alert, oriented, cooperative, not in any acute distress.  She is very pleasant as usual. HEENT:  Pupils are equal.  Extraocular muscles are intact. NECK:  No cervical lymphadenopathy. CHEST:  Her breathing is unlabored. HEART:  Regular. ABDOMEN:  Soft. SKIN:  The wounds are improved and noted in the nurse's note with decreasing redness, swelling, and drainage.  She has been wearing the compression device once a day or once every couple of days.  I have encouraged her to be sure not to skip any days and I think it is making a huge difference.  We will continue with the Unna boot on the right and do an Unna boot on the left and have her follow up in a week.     Wayland Denis, DO     CS/MEDQ  D:  11/10/2011  T:  11/11/2011  Job:  [id number]

## 2011-11-17 ENCOUNTER — Encounter (HOSPITAL_BASED_OUTPATIENT_CLINIC_OR_DEPARTMENT_OTHER): Payer: BC Managed Care – PPO

## 2011-12-08 ENCOUNTER — Encounter (HOSPITAL_BASED_OUTPATIENT_CLINIC_OR_DEPARTMENT_OTHER): Payer: BC Managed Care – PPO | Attending: Plastic Surgery

## 2011-12-08 DIAGNOSIS — I890 Lymphedema, not elsewhere classified: Secondary | ICD-10-CM | POA: Insufficient documentation

## 2011-12-08 DIAGNOSIS — I872 Venous insufficiency (chronic) (peripheral): Secondary | ICD-10-CM | POA: Insufficient documentation

## 2011-12-16 NOTE — Progress Notes (Signed)
[facility]  NAME:  NAWAL, BURLING NO.:  [id number]  MEDICAL RECORD NO.:  [id number]      DATE OF BIRTH:  November 23, 1950  PHYSICIAN:  Wayland Denis, DO            VISIT DATE:                                  OFFICE VISIT   The patient is a 61 year old female who is here for followup on her bilateral lower extremity lymphedema.  She has been using Radio broadcast assistant with compression.  She has been doing the compression 3 times a day.  This has made a remarkable improvement.  She has much less redness on her right leg.  No redness on her left.  The swelling has improved.  The weepiness has improved as well.  There have been no other change in her medications.  REVIEW OF SYSTEMS:  Negative.  PHYSICAL EXAMINATION:  GENERAL:  She is alert, oriented, cooperative, not in any acute distress.  She is pleasant. HEENT:  Pupils are equal.  Extraocular muscles are intact. NECK:  No cervical lymphadenopathy. LUNGS:  Her breathing is unlabored. HEART:  Regular. ABDOMEN:  Soft.  The wounds are noted above.  We will continue with the Unna boot bilaterally for 1 more week and then hopefully we can take it off the left side and go with compression.  We will use the zinc and alginate on the right and we will see her back in 1 week.     Wayland Denis, DO     CS/MEDQ  D:  12/15/2011  T:  12/16/2011  Job:  [id number]

## 2011-12-30 NOTE — Progress Notes (Signed)
[facility]  NAME:  Robin [name], Robin [name]                  ACCOUNT NO.:  MEDICAL RECORD NO.:  [id number]      DATE OF BIRTH:  01/12/1951  PHYSICIAN:  Wayland Denis, DO       VISIT DATE:  12/29/2011                                  OFFICE VISIT   The patient is a 61 year old female with bilateral lower extremity chronic venous insufficiency.  She states that she has been using the compression at home 3 times a day, and had been doing much better. Unfortunately, her right leg got a bit irritated and has been very oozy, no actual open areas.  There has been no other change in her medications or social history.  EXAM:  GENERAL: She is alert, oriented, cooperative, not in any acute distress.  She is pleasant. EYES:  Pupils equal extraocular muscles are intact. NECK: No cervical lymphadenopathy. RESPIRATORY: Her breathing is unlabored. EXTREMITIES: She does have a rash on the dorsal aspect of both hands. She says it is related to the _________ who keep an eye on that.  The wounds are noted in the notes and above.  We will continue with the Unna boot on the right, and recommend compression stockings on the left and certainly going to be recommended for the right side after how she comes out of the Unna boots, and we will see her back in followup.     Wayland Denis, DO     CS/MEDQ  D:  12/29/2011  T:  12/29/2011  Job:  [id number]

## 2012-01-05 ENCOUNTER — Encounter (HOSPITAL_BASED_OUTPATIENT_CLINIC_OR_DEPARTMENT_OTHER): Payer: BC Managed Care – PPO | Attending: Plastic Surgery

## 2012-01-05 DIAGNOSIS — I872 Venous insufficiency (chronic) (peripheral): Secondary | ICD-10-CM | POA: Insufficient documentation

## 2012-01-05 DIAGNOSIS — I890 Lymphedema, not elsewhere classified: Secondary | ICD-10-CM | POA: Insufficient documentation

## 2012-01-20 NOTE — Progress Notes (Signed)
[facility]  NAME:  Robin [name], Robin [name]                  ACCOUNT NO.:  MEDICAL RECORD NO.:  [id number]      DATE OF BIRTH:  12/17/1950  PHYSICIAN:  Wayland Denis, DO       VISIT DATE:  01/19/2012                                  OFFICE VISIT   The patient is a 61 year old female who is here for followup on her bilateral lower extremity lymphedema and chronic venous insufficiency. She has been using zinc, Silvercel, and the TCA.  She does look better today than last week.  She had a lot of drainage, and she states that she is still using her pump, but there has been no weight loss, so no change in that.  There is no change in her medications or social history.  PHYSICAL EXAMINATION:  GENERAL:  She is alert and oriented, cooperative, not in any acute distress.  She is pleasant. HEENT:  Pupils are equal.  Extraocular muscles are intact. NECK:  No cervical lymphadenopathy. EXTREMITIES:  Her legs are red, swollen, and weepy.  Pulses are faint. She does have capillary refill.  We are going to continue with the zinc and TCA and recommend that she follow up with her vascular surgeon to see if any additional options are available as we do not have a long-term cure, only treatment at this point.     Wayland Denis, DO     CS/MEDQ  D:  01/19/2012  T:  01/19/2012  Job:  [id number]

## 2012-02-02 ENCOUNTER — Encounter (HOSPITAL_BASED_OUTPATIENT_CLINIC_OR_DEPARTMENT_OTHER): Payer: BC Managed Care – PPO | Attending: Plastic Surgery

## 2012-02-02 DIAGNOSIS — I890 Lymphedema, not elsewhere classified: Secondary | ICD-10-CM | POA: Insufficient documentation

## 2012-02-02 DIAGNOSIS — I872 Venous insufficiency (chronic) (peripheral): Secondary | ICD-10-CM | POA: Insufficient documentation

## 2012-02-11 NOTE — Progress Notes (Signed)
[facility]  NAME:  AISLEE, LANDGREN                  ACCOUNT NO.:  MEDICAL RECORD NO.:  [id number]      DATE OF BIRTH:  1951/02/07  PHYSICIAN:  Wayland Denis, DO       VISIT DATE:  02/09/2012                                  OFFICE VISIT   The patient is a 61 year old female, who is here for followup on bilateral lower extremity lymphedema.  She is doing much better today. She has been more diligent with using the pumps, using the travel socks and Desitin to help dry out the areas.  There has been no change in her medications or social history.  PHYSICAL EXAMINATION:  GENERAL:  She is alert, oriented, and cooperative, not in any acute distress.  She is pleasant. HEENT:  Pupils equal.  Extraocular muscles are intact. NECK:  No cervical lymphadenopathy. LUNGS:  Breathing is unlabored. HEART:  Regular. ABDOMEN:  Large, but soft. EXTREMITIES:  Wounds are described above in the nurse's notes.  Recommend continuing with the pump, Desitin in the morning,and bag balm in the evening.  Elevation as able and weight loss, healthy eating, and increasing protein.     Wayland Denis, DO     CS/MEDQ  D:  02/09/2012  T:  02/10/2012  Job:  [id number]

## 2012-03-20 ENCOUNTER — Encounter (HOSPITAL_BASED_OUTPATIENT_CLINIC_OR_DEPARTMENT_OTHER): Payer: BC Managed Care – PPO | Attending: Plastic Surgery

## 2012-03-20 DIAGNOSIS — I890 Lymphedema, not elsewhere classified: Secondary | ICD-10-CM | POA: Insufficient documentation

## 2012-03-28 NOTE — Progress Notes (Signed)
[facility]  NAME:  Robin [name], Robin [name]                  ACCOUNT NO.:  MEDICAL RECORD NO.:  [id number]      DATE OF BIRTH:  1950/10/15  PHYSICIAN:  Wayland Denis, DO       VISIT DATE:  03/27/2012                                  OFFICE VISIT   The patient is a 61 year old female with chronic lymphedema of her bilateral lower extremities.  She has been using some compression at home with some wrap with very good improvement.  She has some significant irritation around the ankle area, and this is likely due to her using some ABD pad, which seemed to cause contact dermatitis for her.  We have seen this in the past and that has been the reason. Overall, her legs have improved to a great deal with a decrease in swelling, edema, drainage, pain, and redness.  There has been no change in her social history or medications. Review of systems is otherwise negative.  On exam, she is alert, oriented, cooperative, not in any acute distress. She is pleasant.  Pupils are equal.  Extraocular muscles intact.  No cervical lymphadenopathy.  Breathing is unlabored.  Heart is regular. Abdomen is soft.  The wounds are described above.  We had a long discussion today about the chronicity of this issue and it is likely not going away, but it is manageable.  She needs to consider long-term disability for this if she feels something that she cannot manage with work.  In the meantime, we will continue with TCA wrap.  I will see her back in followup.     Wayland Denis, DO     CS/MEDQ  D:  03/27/2012  T:  03/27/2012  Job:  [id number]

## 2012-04-03 ENCOUNTER — Encounter (HOSPITAL_BASED_OUTPATIENT_CLINIC_OR_DEPARTMENT_OTHER): Payer: BC Managed Care – PPO | Attending: Plastic Surgery

## 2012-04-03 DIAGNOSIS — I890 Lymphedema, not elsewhere classified: Secondary | ICD-10-CM | POA: Insufficient documentation

## 2012-04-17 ENCOUNTER — Encounter (HOSPITAL_BASED_OUTPATIENT_CLINIC_OR_DEPARTMENT_OTHER): Payer: BC Managed Care – PPO

## 2013-07-14 ENCOUNTER — Other Ambulatory Visit: Payer: Self-pay | Admitting: Cardiology

## 2013-07-18 ENCOUNTER — Telehealth: Payer: Self-pay | Admitting: Cardiology

## 2013-07-18 NOTE — Telephone Encounter (Signed)
New message    Talk to Robin [name]---  Talk to you about appt this fri

## 2013-07-18 NOTE — Telephone Encounter (Signed)
Rescheduled appt until next week as she can't come in until then.  Gave refill for 30 day with no refills on warfarin.

## 2013-07-24 ENCOUNTER — Telehealth: Payer: Self-pay | Admitting: Pharmacist

## 2013-07-24 NOTE — Telephone Encounter (Signed)
Follow up    Pt asked that you give her a call please some time today

## 2013-07-24 NOTE — Telephone Encounter (Signed)
Patient's son is sick and she can't leave him to come in for protime.  Next time she will likely be able to come in is next week.  Appointment made.

## 2013-08-07 ENCOUNTER — Other Ambulatory Visit: Payer: Self-pay | Admitting: Cardiology

## 2013-08-07 ENCOUNTER — Ambulatory Visit (INDEPENDENT_AMBULATORY_CARE_PROVIDER_SITE_OTHER): Payer: BC Managed Care – PPO | Admitting: Pharmacist

## 2013-08-07 ENCOUNTER — Other Ambulatory Visit: Payer: Self-pay | Admitting: *Deleted

## 2013-08-07 DIAGNOSIS — I4891 Unspecified atrial fibrillation: Secondary | ICD-10-CM

## 2013-08-07 LAB — POCT INR: INR: 1.9

## 2013-08-07 MED ORDER — WARFARIN SODIUM 5 MG PO TABS: ORAL_TABLET | ORAL | Status: DC

## 2014-01-10 ENCOUNTER — Other Ambulatory Visit: Payer: Self-pay | Admitting: Cardiology

## 2014-01-23 ENCOUNTER — Telehealth: Payer: Self-pay | Admitting: Cardiology

## 2014-01-23 NOTE — Telephone Encounter (Signed)
New message          Pt would like for Riki RuskJeremy to give her a call sometime today.

## 2014-01-24 ENCOUNTER — Telehealth: Payer: Self-pay | Admitting: Cardiology

## 2014-01-24 NOTE — Telephone Encounter (Signed)
Patient overdue for protime.  Appointment made for tomorrow.  She is to call if she can't make it.  She will need a warfarin refill in next few days.  Will plan on giving refills at tomorrow's visit.

## 2014-01-24 NOTE — Telephone Encounter (Signed)
Left message for patient to call back  

## 2014-01-24 NOTE — Telephone Encounter (Signed)
New message           Pt returning jeremy call

## 2014-01-25 MED ORDER — WARFARIN SODIUM 5 MG PO TABS: ORAL_TABLET | ORAL | Status: DC

## 2014-01-25 NOTE — Telephone Encounter (Signed)
Patient couldn't keep appointment for protime today and needs to come in on 01/30/14.  Will be out of warfarin tomorrow.  Called in a 30 day supply with 0 refills.  She understands she won't get anymore until she keeps her appointment.

## 2014-02-08 ENCOUNTER — Ambulatory Visit (INDEPENDENT_AMBULATORY_CARE_PROVIDER_SITE_OTHER): Payer: BC Managed Care – PPO | Admitting: Pharmacist

## 2014-02-08 DIAGNOSIS — I4891 Unspecified atrial fibrillation: Secondary | ICD-10-CM

## 2014-02-08 LAB — POCT INR: INR: 3.9

## 2014-02-08 MED ORDER — WARFARIN SODIUM 5 MG PO TABS: ORAL_TABLET | ORAL | Status: DC

## 2014-02-12 ENCOUNTER — Ambulatory Visit: Payer: BC Managed Care – PPO | Admitting: Cardiology

## 2014-03-16 ENCOUNTER — Inpatient Hospital Stay (HOSPITAL_COMMUNITY)
Admission: EM | Admit: 2014-03-16 | Discharge: 2014-03-22 | DRG: 871 | Disposition: A | Discharge status: Rehabilitation Facility | Payer: BC Managed Care – PPO | Attending: Internal Medicine | Admitting: Internal Medicine

## 2014-03-16 ENCOUNTER — Inpatient Hospital Stay (HOSPITAL_COMMUNITY): Payer: BC Managed Care – PPO

## 2014-03-16 ENCOUNTER — Encounter (HOSPITAL_COMMUNITY): Payer: Self-pay | Admitting: Emergency Medicine

## 2014-03-16 DIAGNOSIS — A4902 Methicillin resistant Staphylococcus aureus infection, unspecified site: Secondary | ICD-10-CM | POA: Diagnosis present

## 2014-03-16 DIAGNOSIS — E871 Hypo-osmolality and hyponatremia: Secondary | ICD-10-CM

## 2014-03-16 DIAGNOSIS — L03116 Cellulitis of left lower limb: Secondary | ICD-10-CM

## 2014-03-16 DIAGNOSIS — E1110 Type 2 diabetes mellitus with ketoacidosis without coma: Secondary | ICD-10-CM

## 2014-03-16 DIAGNOSIS — L98499 Non-pressure chronic ulcer of skin of other sites with unspecified severity: Secondary | ICD-10-CM | POA: Diagnosis present

## 2014-03-16 DIAGNOSIS — G4730 Sleep apnea, unspecified: Secondary | ICD-10-CM | POA: Diagnosis present

## 2014-03-16 DIAGNOSIS — R339 Retention of urine, unspecified: Secondary | ICD-10-CM | POA: Diagnosis present

## 2014-03-16 DIAGNOSIS — E1149 Type 2 diabetes mellitus with other diabetic neurological complication: Secondary | ICD-10-CM | POA: Diagnosis present

## 2014-03-16 DIAGNOSIS — D509 Iron deficiency anemia, unspecified: Secondary | ICD-10-CM | POA: Diagnosis present

## 2014-03-16 DIAGNOSIS — L97929 Non-pressure chronic ulcer of unspecified part of left lower leg with unspecified severity: Secondary | ICD-10-CM

## 2014-03-16 DIAGNOSIS — I129 Hypertensive chronic kidney disease with stage 1 through stage 4 chronic kidney disease, or unspecified chronic kidney disease: Secondary | ICD-10-CM | POA: Diagnosis present

## 2014-03-16 DIAGNOSIS — N183 Chronic kidney disease, stage 3 unspecified: Secondary | ICD-10-CM | POA: Diagnosis present

## 2014-03-16 DIAGNOSIS — F411 Generalized anxiety disorder: Secondary | ICD-10-CM | POA: Diagnosis present

## 2014-03-16 DIAGNOSIS — E876 Hypokalemia: Secondary | ICD-10-CM | POA: Diagnosis present

## 2014-03-16 DIAGNOSIS — I480 Paroxysmal atrial fibrillation: Secondary | ICD-10-CM

## 2014-03-16 DIAGNOSIS — I872 Venous insufficiency (chronic) (peripheral): Secondary | ICD-10-CM | POA: Diagnosis present

## 2014-03-16 DIAGNOSIS — L03119 Cellulitis of unspecified part of limb: Secondary | ICD-10-CM

## 2014-03-16 DIAGNOSIS — E1142 Type 2 diabetes mellitus with diabetic polyneuropathy: Secondary | ICD-10-CM | POA: Diagnosis present

## 2014-03-16 DIAGNOSIS — Z794 Long term (current) use of insulin: Secondary | ICD-10-CM

## 2014-03-16 DIAGNOSIS — R6520 Severe sepsis without septic shock: Secondary | ICD-10-CM

## 2014-03-16 DIAGNOSIS — E1310 Other specified diabetes mellitus with ketoacidosis without coma: Secondary | ICD-10-CM | POA: Diagnosis present

## 2014-03-16 DIAGNOSIS — I2510 Atherosclerotic heart disease of native coronary artery without angina pectoris: Secondary | ICD-10-CM | POA: Diagnosis present

## 2014-03-16 DIAGNOSIS — F329 Major depressive disorder, single episode, unspecified: Secondary | ICD-10-CM | POA: Diagnosis present

## 2014-03-16 DIAGNOSIS — Z8249 Family history of ischemic heart disease and other diseases of the circulatory system: Secondary | ICD-10-CM

## 2014-03-16 DIAGNOSIS — Z7901 Long term (current) use of anticoagulants: Secondary | ICD-10-CM

## 2014-03-16 DIAGNOSIS — N179 Acute kidney failure, unspecified: Secondary | ICD-10-CM

## 2014-03-16 DIAGNOSIS — K5900 Constipation, unspecified: Secondary | ICD-10-CM | POA: Diagnosis not present

## 2014-03-16 DIAGNOSIS — A419 Sepsis, unspecified organism: Principal | ICD-10-CM

## 2014-03-16 DIAGNOSIS — I739 Peripheral vascular disease, unspecified: Secondary | ICD-10-CM | POA: Diagnosis present

## 2014-03-16 DIAGNOSIS — E785 Hyperlipidemia, unspecified: Secondary | ICD-10-CM | POA: Diagnosis present

## 2014-03-16 DIAGNOSIS — F3289 Other specified depressive episodes: Secondary | ICD-10-CM | POA: Diagnosis present

## 2014-03-16 DIAGNOSIS — L97919 Non-pressure chronic ulcer of unspecified part of right lower leg with unspecified severity: Secondary | ICD-10-CM

## 2014-03-16 DIAGNOSIS — L97809 Non-pressure chronic ulcer of other part of unspecified lower leg with unspecified severity: Secondary | ICD-10-CM | POA: Diagnosis present

## 2014-03-16 DIAGNOSIS — L02419 Cutaneous abscess of limb, unspecified: Secondary | ICD-10-CM | POA: Diagnosis present

## 2014-03-16 DIAGNOSIS — I4891 Unspecified atrial fibrillation: Secondary | ICD-10-CM

## 2014-03-16 DIAGNOSIS — E875 Hyperkalemia: Secondary | ICD-10-CM

## 2014-03-16 DIAGNOSIS — L03115 Cellulitis of right lower limb: Secondary | ICD-10-CM

## 2014-03-16 LAB — URINALYSIS, ROUTINE W REFLEX MICROSCOPIC
Bilirubin Urine: NEGATIVE
Glucose, UA: NEGATIVE mg/dL
Hgb urine dipstick: NEGATIVE
Ketones, ur: NEGATIVE mg/dL
LEUKOCYTES UA: NEGATIVE
Nitrite: NEGATIVE
Protein, ur: NEGATIVE mg/dL
Specific Gravity, Urine: 1.019 (ref 1.005–1.030)
UROBILINOGEN UA: 0.2 mg/dL (ref 0.0–1.0)
pH: 5 (ref 5.0–8.0)

## 2014-03-16 LAB — CBC WITH DIFFERENTIAL/PLATELET
BASOS PCT: 0 % (ref 0–1)
Basophils Absolute: 0 10*3/uL (ref 0.0–0.1)
Eosinophils Absolute: 0 10*3/uL (ref 0.0–0.7)
Eosinophils Relative: 0 % (ref 0–5)
HCT: 34.8 % — ABNORMAL LOW (ref 36.0–46.0)
HEMOGLOBIN: 11 g/dL — AB (ref 12.0–15.0)
LYMPHS ABS: 0.6 10*3/uL — AB (ref 0.7–4.0)
Lymphocytes Relative: 3 % — ABNORMAL LOW (ref 12–46)
MCH: 20.8 pg — ABNORMAL LOW (ref 26.0–34.0)
MCHC: 31.6 g/dL (ref 30.0–36.0)
MCV: 65.8 fL — ABNORMAL LOW (ref 78.0–100.0)
MONOS PCT: 4 % (ref 3–12)
Monocytes Absolute: 0.9 10*3/uL (ref 0.1–1.0)
NEUTROS PCT: 93 % — AB (ref 43–77)
Neutro Abs: 20.2 10*3/uL — ABNORMAL HIGH (ref 1.7–7.7)
Platelets: 563 10*3/uL — ABNORMAL HIGH (ref 150–400)
RBC: 5.29 MIL/uL — AB (ref 3.87–5.11)
RDW: 18 % — ABNORMAL HIGH (ref 11.5–15.5)
WBC: 21.6 10*3/uL — ABNORMAL HIGH (ref 4.0–10.5)

## 2014-03-16 LAB — BASIC METABOLIC PANEL
BUN: 139 mg/dL — AB (ref 6–23)
BUN: 123 mg/dL — AB (ref 6–23)
BUN: 117 mg/dL — AB (ref 6–23)
CALCIUM: 8.8 mg/dL (ref 8.4–10.5)
CALCIUM: 9.1 mg/dL (ref 8.4–10.5)
CO2: 16 mEq/L — ABNORMAL LOW (ref 19–32)
CO2: 15 meq/L — AB (ref 19–32)
CO2: 16 meq/L — AB (ref 19–32)
CREATININE: 1.89 mg/dL — AB (ref 0.50–1.10)
CREATININE: 1.97 mg/dL — AB (ref 0.50–1.10)
Calcium: 9.4 mg/dL (ref 8.4–10.5)
Chloride: 76 mEq/L — ABNORMAL LOW (ref 96–112)
Chloride: 85 mEq/L — ABNORMAL LOW (ref 96–112)
Chloride: 86 mEq/L — ABNORMAL LOW (ref 96–112)
Creatinine, Ser: 2.28 mg/dL — ABNORMAL HIGH (ref 0.50–1.10)
GFR calc Af Amer: 25 mL/min — ABNORMAL LOW (ref >90)
GFR calc Af Amer: 32 mL/min — ABNORMAL LOW (ref >90)
GFR calc Af Amer: 30 mL/min — ABNORMAL LOW (ref >90)
GFR calc non Af Amer: 22 mL/min — ABNORMAL LOW (ref >90)
GFR calc non Af Amer: 28 mL/min — ABNORMAL LOW (ref >90)
GFR calc non Af Amer: 26 mL/min — ABNORMAL LOW (ref >90)
GLUCOSE: 173 mg/dL — AB (ref 70–99)
Glucose, Bld: 217 mg/dL — ABNORMAL HIGH (ref 70–99)
Glucose, Bld: 176 mg/dL — ABNORMAL HIGH (ref 70–99)
Potassium: 7.2 mEq/L (ref 3.7–5.3)
Potassium: 6.1 mEq/L — ABNORMAL HIGH (ref 3.7–5.3)
Potassium: 5.4 mEq/L — ABNORMAL HIGH (ref 3.7–5.3)
SODIUM: 120 meq/L — AB (ref 137–147)
Sodium: 111 mEq/L — CL (ref 137–147)
Sodium: 118 mEq/L — CL (ref 137–147)

## 2014-03-16 LAB — GLUCOSE, CAPILLARY: GLUCOSE-CAPILLARY: 191 mg/dL — AB (ref 70–99)

## 2014-03-16 LAB — PROTIME-INR
INR: 2.32 — ABNORMAL HIGH (ref 0.00–1.49)
Prothrombin Time: 24.7 seconds — ABNORMAL HIGH (ref 11.6–15.2)

## 2014-03-16 LAB — I-STAT CG4 LACTIC ACID, ED: Lactic Acid, Venous: 1.2 mmol/L (ref 0.5–2.2)

## 2014-03-16 LAB — CBG MONITORING, ED: Glucose-Capillary: 177 mg/dL — ABNORMAL HIGH (ref 70–99)

## 2014-03-16 LAB — MRSA PCR SCREENING: MRSA by PCR: POSITIVE — AB

## 2014-03-16 LAB — POTASSIUM: POTASSIUM: 7.4 meq/L — AB (ref 3.7–5.3)

## 2014-03-16 MED ORDER — SODIUM CHLORIDE 0.9 % IV SOLN
Freq: Once | INTRAVENOUS | Status: AC
  Administered 2014-03-16: 13:00:00 via INTRAVENOUS

## 2014-03-16 MED ORDER — SODIUM CHLORIDE 0.9 % IV SOLN
1.0000 g | Freq: Once | INTRAVENOUS | Status: AC
  Administered 2014-03-16: 1 g via INTRAVENOUS
  Filled 2014-03-16: qty 10

## 2014-03-16 MED ORDER — CHLORHEXIDINE GLUCONATE CLOTH 2 % EX PADS
6.0000 | MEDICATED_PAD | Freq: Every day | CUTANEOUS | Status: AC
  Administered 2014-03-17 – 2014-03-21 (×5): 6 via TOPICAL

## 2014-03-16 MED ORDER — SODIUM CHLORIDE 0.9 % IJ SOLN
3.0000 mL | Freq: Two times a day (BID) | INTRAMUSCULAR | Status: DC
  Administered 2014-03-17 – 2014-03-22 (×11): 3 mL via INTRAVENOUS

## 2014-03-16 MED ORDER — LORAZEPAM 2 MG/ML IJ SOLN
0.5000 mg | Freq: Once | INTRAMUSCULAR | Status: AC
  Administered 2014-03-16: 0.5 mg via INTRAVENOUS
  Filled 2014-03-16: qty 1

## 2014-03-16 MED ORDER — SODIUM POLYSTYRENE SULFONATE 15 GM/60ML PO SUSP
45.0000 g | Freq: Once | ORAL | Status: AC
  Administered 2014-03-16: 45 g via ORAL
  Filled 2014-03-16: qty 180

## 2014-03-16 MED ORDER — SODIUM CHLORIDE 0.9 % IV SOLN: INTRAVENOUS | Status: DC

## 2014-03-16 MED ORDER — VANCOMYCIN HCL 10 G IV SOLR
2500.0000 mg | Freq: Once | INTRAVENOUS | Status: AC
  Administered 2014-03-16: 2500 mg via INTRAVENOUS
  Filled 2014-03-16: qty 2500

## 2014-03-16 MED ORDER — SODIUM BICARBONATE 8.4 % IV SOLN
INTRAVENOUS | Status: DC
  Administered 2014-03-16: 17:00:00 via INTRAVENOUS
  Filled 2014-03-16 (×2): qty 850

## 2014-03-16 MED ORDER — ONDANSETRON 4 MG PO TBDP
4.0000 mg | ORAL_TABLET | Freq: Once | ORAL | Status: AC
  Administered 2014-03-16: 4 mg via ORAL
  Filled 2014-03-16: qty 1

## 2014-03-16 MED ORDER — MUPIROCIN 2 % EX OINT
1.0000 "application " | TOPICAL_OINTMENT | Freq: Two times a day (BID) | CUTANEOUS | Status: AC
  Administered 2014-03-17 – 2014-03-21 (×9): 1 via NASAL
  Filled 2014-03-16 (×2): qty 22

## 2014-03-16 MED ORDER — ACETAMINOPHEN 325 MG PO TABS
650.0000 mg | ORAL_TABLET | Freq: Four times a day (QID) | ORAL | Status: DC | PRN
  Administered 2014-03-17 – 2014-03-22 (×10): 650 mg via ORAL
  Filled 2014-03-16 (×11): qty 2

## 2014-03-16 MED ORDER — ONDANSETRON HCL 4 MG PO TABS: 4.0000 mg | ORAL_TABLET | Freq: Four times a day (QID) | ORAL | Status: DC | PRN

## 2014-03-16 MED ORDER — ONDANSETRON HCL 4 MG/2ML IJ SOLN
4.0000 mg | Freq: Four times a day (QID) | INTRAMUSCULAR | Status: DC | PRN
Start: 2014-03-16 — End: 2014-03-22
  Administered 2014-03-20: 4 mg via INTRAVENOUS
  Filled 2014-03-16: qty 2

## 2014-03-16 MED ORDER — PIPERACILLIN-TAZOBACTAM 3.375 G IVPB 30 MIN
3.3750 g | Freq: Once | INTRAVENOUS | Status: AC
  Administered 2014-03-16: 3.375 g via INTRAVENOUS
  Filled 2014-03-16: qty 50

## 2014-03-16 MED ORDER — SODIUM BICARBONATE 8.4 % IV SOLN: INTRAVENOUS | Status: DC

## 2014-03-16 MED ORDER — VANCOMYCIN HCL 10 G IV SOLR
2000.0000 mg | INTRAVENOUS | Status: DC
  Filled 2014-03-16: qty 2000

## 2014-03-16 MED ORDER — MORPHINE SULFATE 4 MG/ML IJ SOLN
4.0000 mg | Freq: Once | INTRAMUSCULAR | Status: AC
  Administered 2014-03-16: 4 mg via INTRAVENOUS
  Filled 2014-03-16: qty 1

## 2014-03-16 MED ORDER — WARFARIN SODIUM 5 MG PO TABS
5.0000 mg | ORAL_TABLET | Freq: Once | ORAL | Status: AC
  Administered 2014-03-16: 5 mg via ORAL
  Filled 2014-03-16: qty 1

## 2014-03-16 MED ORDER — SODIUM CHLORIDE 0.9 % IV SOLN
INTRAVENOUS | Status: DC
  Filled 2014-03-16: qty 1

## 2014-03-16 MED ORDER — INSULIN ASPART 100 UNIT/ML ~~LOC~~ SOLN
10.0000 [IU] | Freq: Once | SUBCUTANEOUS | Status: AC
  Administered 2014-03-16: 10 [IU] via INTRAVENOUS

## 2014-03-16 MED ORDER — STERILE WATER FOR INJECTION IV SOLN
INTRAVENOUS | Status: AC
  Administered 2014-03-16: 23:00:00 via INTRAVENOUS
  Filled 2014-03-16 (×3): qty 1000

## 2014-03-16 MED ORDER — METOPROLOL TARTRATE 25 MG PO TABS
25.0000 mg | ORAL_TABLET | Freq: Two times a day (BID) | ORAL | Status: DC
  Administered 2014-03-16: 25 mg via ORAL
  Filled 2014-03-16 (×3): qty 1

## 2014-03-16 MED ORDER — INSULIN REGULAR BOLUS VIA INFUSION
0.0000 [IU] | Freq: Three times a day (TID) | INTRAVENOUS | Status: DC
  Filled 2014-03-16: qty 10

## 2014-03-16 MED ORDER — ALBUTEROL SULFATE (2.5 MG/3ML) 0.083% IN NEBU: 2.5000 mg | INHALATION_SOLUTION | Freq: Four times a day (QID) | RESPIRATORY_TRACT | Status: DC | PRN

## 2014-03-16 MED ORDER — PIPERACILLIN-TAZOBACTAM 3.375 G IVPB
3.3750 g | Freq: Three times a day (TID) | INTRAVENOUS | Status: DC
  Administered 2014-03-16 – 2014-03-18 (×6): 3.375 g via INTRAVENOUS
  Filled 2014-03-16 (×8): qty 50

## 2014-03-16 MED ORDER — SODIUM CHLORIDE 0.9 % IV BOLUS (SEPSIS)
1000.0000 mL | Freq: Once | INTRAVENOUS | Status: AC
  Administered 2014-03-16: 1000 mL via INTRAVENOUS

## 2014-03-16 MED ORDER — ONDANSETRON HCL 4 MG/2ML IJ SOLN
4.0000 mg | Freq: Once | INTRAMUSCULAR | Status: AC
  Administered 2014-03-16: 4 mg via INTRAVENOUS
  Filled 2014-03-16: qty 2

## 2014-03-16 MED ORDER — SODIUM POLYSTYRENE SULFONATE 15 GM/60ML PO SUSP
30.0000 g | Freq: Once | ORAL | Status: AC
  Administered 2014-03-16: 30 g via ORAL
  Filled 2014-03-16: qty 120

## 2014-03-16 MED ORDER — DEXTROSE-NACL 5-0.45 % IV SOLN: INTRAVENOUS | Status: DC

## 2014-03-16 MED ORDER — ALBUTEROL SULFATE (2.5 MG/3ML) 0.083% IN NEBU
2.5000 mg | INHALATION_SOLUTION | Freq: Once | RESPIRATORY_TRACT | Status: DC
  Filled 2014-03-16: qty 3

## 2014-03-16 MED ORDER — WARFARIN - PHARMACIST DOSING INPATIENT
Freq: Every day | Status: DC
  Administered 2014-03-16 – 2014-03-21 (×2)

## 2014-03-16 MED ORDER — DEXTROSE 50 % IV SOLN: 25.0000 mL | INTRAVENOUS | Status: DC | PRN

## 2014-03-16 MED ORDER — INSULIN ASPART 100 UNIT/ML ~~LOC~~ SOLN
0.0000 [IU] | SUBCUTANEOUS | Status: DC
  Administered 2014-03-16 – 2014-03-17 (×4): 2 [IU] via SUBCUTANEOUS

## 2014-03-16 MED ORDER — BUPROPION HCL ER (XL) 300 MG PO TB24
300.0000 mg | ORAL_TABLET | Freq: Every day | ORAL | Status: DC
  Administered 2014-03-16 – 2014-03-22 (×7): 300 mg via ORAL
  Filled 2014-03-16 (×7): qty 1

## 2014-03-16 MED ORDER — DEXTROSE-NACL 5-0.9 % IV SOLN: INTRAVENOUS | Status: DC

## 2014-03-16 MED ORDER — SIMVASTATIN 20 MG PO TABS
20.0000 mg | ORAL_TABLET | Freq: Every day | ORAL | Status: DC
  Administered 2014-03-16 – 2014-03-22 (×7): 20 mg via ORAL
  Filled 2014-03-16 (×9): qty 1

## 2014-03-16 MED ORDER — ONDANSETRON HCL 4 MG/2ML IJ SOLN: 4.0000 mg | Freq: Three times a day (TID) | INTRAMUSCULAR | Status: DC | PRN

## 2014-03-16 MED ORDER — ACETAMINOPHEN 650 MG RE SUPP: 650.0000 mg | Freq: Four times a day (QID) | RECTAL | Status: DC | PRN

## 2014-03-16 NOTE — ED Provider Notes (Signed)
Medical screening examination/treatment/procedure(s) were performed by non-physician practitioner and as supervising physician I was immediately available for consultation/collaboration.   EKG Interpretation   Date/Time:  Saturday March 16 2014 12:25:26 EDT Ventricular Rate:  68 PR Interval:    QRS Duration: 112 QT Interval:  417 QTC Calculation: 443 R Axis:   147 Text Interpretation:  Atrial fibrillation Low voltage, precordial leads  Probable anterolateral infarct, old mildly peaked T waves  Confirmed by  Rubin PayorPICKERING  MD, Geniya Fulgham [phone] on 03/16/2014 4:04:50 PM       Juliet RudeNathan R. Rubin PayorPickering, MD 03/16/14 [phone]

## 2014-03-16 NOTE — ED Provider Notes (Signed)
CSN: [id number]     Arrival date & time 03/16/14  1025 History   First MD Initiated Contact with Patient 03/16/14 1052     Chief Complaint  Patient presents with  . Weakness  . Wound Infection     (Consider location/radiation/quality/duration/timing/severity/associated sxs/prior Treatment) HPI Comments: Patient with history of diabetes on insulin, atrial fibrillation on warfarin -- presents with complaint of worsening of her chronic lower extremity ulcerations. Patient has noted increasing pain for the past one to 2 weeks. Today she could not tolerate the pain and was unable to bear weight on her legs, prompting a visit to the emergency department. She has not had fever, vomiting. She has had some nausea. Patient states that she has seen a wound care clinic in the past but has not been there in some time. She has been on antibiotics before when her wounds flare-up, however she has improvement with antibiotics only sometimes. Onset of symptoms gradual. Course is constant. Nothing makes symptoms better.  Patient is a 63 y.o. female presenting with weakness. The history is provided by the patient and medical records.  Weakness Associated symptoms include myalgias, nausea and weakness. Pertinent negatives include no abdominal pain, chest pain, coughing, fever, headaches, rash, sore throat or vomiting.    Past Medical History  Diagnosis Date  . Diabetes mellitus   . Hypertension   . Hyperlipidemia   . Atrial fibrillation   . Sleep apnea   . Cholelithiasis   . CAD (coronary artery disease)    Past Surgical History  Procedure Laterality Date  . Tonsillectomy  1970s  . Hemorroidectomy  1990s  . Choledochal cyst excision  1980s  . Parathyroidectomy    . Endovenous ablation saphenous vein w/ laser  08-02-2011  Right greater saphenous vein   Family History  Problem Relation Age of Onset  . Hypertension Mother   . Heart disease Father 2855    MI  . COPD Father   . Other Brother     heart  issues  . Heart disease Brother   . Other Brother     heart issues  . Heart disease Brother   . Drug abuse Brother    History  Substance Use Topics  . Smoking status: Never Smoker   . Smokeless tobacco: Never Used  . Alcohol Use: No   OB History   Grav Para Term Preterm Abortions TAB SAB Ect Mult Living                 Review of Systems  Constitutional: Negative for fever.  HENT: Negative for rhinorrhea and sore throat.   Eyes: Negative for redness.  Respiratory: Negative for cough.   Cardiovascular: Negative for chest pain.  Gastrointestinal: Positive for nausea. Negative for vomiting, abdominal pain and diarrhea.  Genitourinary: Negative for dysuria.  Musculoskeletal: Positive for myalgias.  Skin: Positive for color change. Negative for rash.  Neurological: Positive for weakness. Negative for headaches.   Allergies  Review of patient's allergies indicates no known allergies.  Home Medications   Prior to Admission medications   Medication Sig Start Date End Date Taking? Authorizing Provider  buPROPion (WELLBUTRIN XL) 300 MG 24 hr tablet Take 1 tablet by mouth daily.  06/24/11  Yes Historical Provider, MD  Furosemide (LASIX PO) Take 40 mg by mouth daily.    Yes Historical Provider, MD  insulin aspart (NOVOLOG) 100 UNIT/ML injection Inject 40-42 Units into the skin 3 (three) times daily before meals.   Yes Historical Provider, MD  insulin glargine (LANTUS) 100 UNIT/ML injection Inject 30 Units into the skin at bedtime.    Yes Historical Provider, MD  metoprolol tartrate (LOPRESSOR) 25 MG tablet Take 25 mg by mouth 2 (two) times daily.   Yes Historical Provider, MD  neomycin-bacitracin-polymyxin (NEOSPORIN) ointment Apply 1 application topically 2 (two) times daily. apply to eye   Yes Historical Provider, MD  simvastatin (ZOCOR) 20 MG tablet Take 1 tablet by mouth daily.  07/04/11  Yes Historical Provider, MD  warfarin (COUMADIN) 5 MG tablet Take 5 mg by mouth daily. Take as  directed by [facility] 02/08/14  Yes Quintella Reichertraci R Turner, MD   BP 106/71  Pulse 70  Temp(Src) 97.6 F (36.4 C) (Oral)  Resp 22  SpO2 100%  Physical Exam  Nursing note and vitals reviewed. Constitutional: She appears well-developed and well-nourished.  HENT:  Head: Normocephalic and atraumatic.  Eyes: Conjunctivae are normal. Right eye exhibits no discharge. Left eye exhibits no discharge.  Neck: Normal range of motion. Neck supple.  Cardiovascular: Normal rate and normal heart sounds.  An irregularly irregular rhythm present.  Pulses:      Dorsalis pedis pulses are 2+ on the right side, and 2+ on the left side.  Pulmonary/Chest: Effort normal and breath sounds normal.  Abdominal: Soft. There is no tenderness.  Neurological: She is alert.  Skin: Skin is warm and dry.  Widespread skin ulceration and redness to bilateral ankles and distal calves.   Psychiatric: She has a normal mood and affect.    ED Course  Procedures (including critical care time) Labs Review Labs Reviewed  CBC WITH DIFFERENTIAL - Abnormal; Notable for the following:    WBC 21.6 (*)    RBC 5.29 (*)    Hemoglobin 11.0 (*)    HCT 34.8 (*)    MCV 65.8 (*)    MCH 20.8 (*)    RDW 18.0 (*)    Platelets 563 (*)    Neutrophils Relative % 93 (*)    Neutro Abs 20.2 (*)    Lymphocytes Relative 3 (*)    Lymphs Abs 0.6 (*)    All other components within normal limits  BASIC METABOLIC PANEL - Abnormal; Notable for the following:    Sodium 111 (*)    Potassium 7.2 (*)    Chloride 76 (*)    CO2 16 (*)    Glucose, Bld 217 (*)    BUN 139 (*)    Creatinine, Ser 2.28 (*)    GFR calc non Af Amer 22 (*)    GFR calc Af Amer 25 (*)    All other components within normal limits  PROTIME-INR - Abnormal; Notable for the following:    Prothrombin Time 24.7 (*)    INR 2.32 (*)    All other components within normal limits  POTASSIUM - Abnormal; Notable for the following:    Potassium 7.4 (*)    All other components within  normal limits  CULTURE, BLOOD (ROUTINE X 2)  CULTURE, BLOOD (ROUTINE X 2)  I-STAT CG4 LACTIC ACID, ED    Imaging Review No results found.   EKG Interpretation None      11:10 AM Patient seen and examined. Work-up initiated. Medications ordered.   Vital signs reviewed and are as follows: Filed Vitals:   03/16/14 1047  BP:   Pulse: 70  Temp:   Resp: 22  BP 106/71  Pulse 70  Temp(Src) 97.6 F (36.4 C) (Oral)  Resp 22  SpO2 100%  12:28  PM Labs noted. Hyperkalemia -- EKG ordered, insulin/kayexalate. Fluids ordered for AKI, hyponatremia, hypokalemia. Pt informed agrees to admission. Will start ABX (Vancomycin and Zosyn).   Pt admits to decreased oral intake for the past week. She is still taking lasix daily. No potassium supplementation.   1:32 PM Pt admitted to [facility].     MDM   Final diagnoses:  Hyponatremia  Hyperkalemia  Acute kidney injury  Cellulitis of both lower extremities  Atrial fibrillation   Admit for above.     Renne Crigler, PA-C 03/16/14 1334

## 2014-03-16 NOTE — Progress Notes (Signed)
[facility] PROGRESS NOTE  Robin BaizeJudith M Winslow ZOX:[id number] DOB: 09/16/1951 DOA: 03/16/2014 PCP: Cala BradfordWHITE,[name], MD  D/w nephrologist  Repeat K -7.4; Start IV bicarb, IVF as need for low BP; Hold IV insulin drip; Pt glucose dropped with insulin unfortunately can't run d 5%or 10% with insulin drip due risk of severe hypo Na with Na 111. -close monitor  Repeat BMP Q 3hrs   Esperanza SheetsBURIEV, Areil Ottey N  [facility] Pager [phone]. If 7PM-7AM, please contact night-coverage at [email or web address], password [facility] 03/16/2014, 3:49 PM  LOS: 0 days

## 2014-03-16 NOTE — Progress Notes (Addendum)
ANTIBIOTIC CONSULT NOTE - INITIAL  Pharmacy Consult for vanc and zosyn; coumadin Indication: cellulitis (diabetic) ; afib  No Known Allergies  Patient Measurements:   Adjusted Body Weight: 134.3kg  Vital Signs: Temp: 97.6 F (36.4 C) (06/13 1044) Temp src: Oral (06/13 1044) BP: 128/61 mmHg (06/13 1239) Pulse Rate: 67 (06/13 1239) Intake/Output from previous day:   Intake/Output from this shift:    Labs:  Recent Labs  03/16/14 1123  WBC 21.6*  HGB 11.0*  PLT 563*  CREATININE 2.28*   The CrCl is unknown because both a height and weight (above a minimum accepted value) are required for this calculation. No results found for this basename: VANCOTROUGH, VANCOPEAK, VANCORANDOM, GENTTROUGH, GENTPEAK, GENTRANDOM, TOBRATROUGH, TOBRAPEAK, TOBRARND, AMIKACINPEAK, AMIKACINTROU, AMIKACIN,  in the last 72 hours   Microbiology: No results found for this or any previous visit (from the past 720 hour(s)).  Medical History: Past Medical History  Diagnosis Date  . Diabetes mellitus   . Hypertension   . Hyperlipidemia   . Atrial fibrillation   . Sleep apnea   . Cholelithiasis   . CAD (coronary artery disease)     Medications:  See med history Assessment: 63 yo lady PMH of HTN, HPL, A fib on AC/coumadin, IDDM, PAD, venous insufficiency chronic leg wounds presented with progressive weakness, worsening of leg ulcers to start broad spectrum antibiotics.    Goal of Therapy:  Vancomycin trough level 10-15 mcg/ml  Plan:  Vancomycin 2500 mg IV X 1 then 2000 mg IV q48 hours Zosyn 3.375 gm IV q8 hours F/u renal function, cultures and clinical course.  Thanks for allowing pharmacy to be a part of this patient's care.  Talbert CageLora Seay, PharmD Clinical Pharmacist, [phone] 03/16/2014,1:22 PM  Addendum: Patient on coumadin pta for afib. INR on admission is therapeutic at 2.32. Home dose is 5mg  daily with last dose taken 6/12.   Plan: 1) Coumadin 5mg  x 1 2) Daily INR  Louie CasaJennifer  Kwana Ringel, PharmD, BCPS 03/16/2014, 3:53 PM

## 2014-03-16 NOTE — H&P (Signed)
[facility] History and Physical  Robin BaizeJudith M Tyer [id number] DOB: 01/22/1951 DOA: 03/16/2014  Referring physician:  PCP: Cala BradfordWHITE,[name], MD  Specialists:   Chief Complaint: weakness, leg ulcers    HPI: Robin [name] is a 63 y.o. female with PMH of HTN, HPL, A fib on AC/couamadin, IDDM, PAD, venous insufficiency chronic leg wounds presented with progressive weakness, worsening of leg ulcers; in ED found to have Hyper K 7.2, hypo Na -111 given IVF/NS, IV insulin+d50%, kayexalate for hyper K;  -patient denies focal neurological symptoms, but tired, fatigue with generalized weakness; no chest pain, no SOB, no cough, no nausea, vomiting or diarrhea   Review of Systems: The patient denies anorexia, fever, weight loss,, vision loss, decreased hearing, hoarseness, chest pain, syncope, dyspnea on exertion, peripheral edema, balance deficits, hemoptysis, abdominal pain, melena, hematochezia, severe indigestion/heartburn, hematuria, incontinence, genital sores, muscle weakness, suspicious skin lesions, transient blindness, difficulty walking, depression, unusual weight change, abnormal bleeding, enlarged lymph nodes, angioedema, and breast masses.    Past Medical History  Diagnosis Date  . Diabetes mellitus   . Hypertension   . Hyperlipidemia   . Atrial fibrillation   . Sleep apnea   . Cholelithiasis   . CAD (coronary artery disease)    Past Surgical History  Procedure Laterality Date  . Tonsillectomy  1970s  . Hemorroidectomy  1990s  . Choledochal cyst excision  1980s  . Parathyroidectomy    . Endovenous ablation saphenous vein w/ laser  08-02-2011  Right greater saphenous vein   Social History:  reports that she has never smoked. She has never used smokeless tobacco. She reports that she does not drink alcohol or use illicit drugs. [place];  where does patient live--home, ALF, SNF? and with whom if at home? Yes;  Can patient participate in ADLs?  No Known  Allergies  Family History  Problem Relation Age of Onset  . Hypertension Mother   . Heart disease Father 3155    MI  . COPD Father   . Other Brother     heart issues  . Heart disease Brother   . Other Brother     heart issues  . Heart disease Brother   . Drug abuse Brother     (be sure to complete)  Prior to Admission medications   Medication Sig Start Date End Date Taking? Authorizing Provider  buPROPion (WELLBUTRIN XL) 300 MG 24 hr tablet Take 1 tablet by mouth daily.  06/24/11  Yes Historical Provider, MD  Furosemide (LASIX PO) Take 40 mg by mouth daily.    Yes Historical Provider, MD  insulin aspart (NOVOLOG) 100 UNIT/ML injection Inject 40-42 Units into the skin 3 (three) times daily before meals.   Yes Historical Provider, MD  insulin glargine (LANTUS) 100 UNIT/ML injection Inject 30 Units into the skin at bedtime.    Yes Historical Provider, MD  metoprolol tartrate (LOPRESSOR) 25 MG tablet Take 25 mg by mouth 2 (two) times daily.   Yes Historical Provider, MD  neomycin-bacitracin-polymyxin (NEOSPORIN) ointment Apply 1 application topically 2 (two) times daily. apply to eye   Yes Historical Provider, MD  simvastatin (ZOCOR) 20 MG tablet Take 1 tablet by mouth daily.  07/04/11  Yes Historical Provider, MD  warfarin (COUMADIN) 5 MG tablet Take 5 mg by mouth daily. Take as directed by [facility] 02/08/14  Yes Quintella Reichertraci R Turner, MD   Physical Exam: Filed Vitals:   03/16/14 1239  BP: 128/61  Pulse: 67  Temp:   Resp: 22  General:  alert  Eyes: eom-i  ENT: no oral ulcers   Neck: supple   Cardiovascular: s1,s2 rrr  Respiratory: CTA BL  Abdomen: soft, obese, nt  Skin: leg ulcers  Musculoskeletal: BL leg ulcers extensive   Psychiatric: no hallucinations   Neurologic: CN 2-12 intact, motor 5/5 non focal   Labs on Admission:  Basic Metabolic Panel:  Recent Labs Lab 03/16/14 1123  NA 111*  K 7.2*  CL 76*  CO2 16*  GLUCOSE 217*  BUN 139*  CREATININE  2.28*  CALCIUM 9.4   Liver Function Tests: No results found for this basename: AST, ALT, ALKPHOS, BILITOT, PROT, ALBUMIN,  in the last 168 hours No results found for this basename: LIPASE, AMYLASE,  in the last 168 hours No results found for this basename: AMMONIA,  in the last 168 hours CBC:  Recent Labs Lab 03/16/14 1123  WBC 21.6*  NEUTROABS 20.2*  HGB 11.0*  HCT 34.8*  MCV 65.8*  PLT 563*   Cardiac Enzymes: No results found for this basename: CKTOTAL, CKMB, CKMBINDEX, TROPONINI,  in the last 168 hours  BNP (last 3 results) No results found for this basename: PROBNP,  in the last 8760 hours CBG: No results found for this basename: GLUCAP,  in the last 168 hours  Radiological Exams on Admission: No results found.  EKG: Independently reviewed. A fib t wave changes   Assessment/Plan Principal Problem:   Hyperkalemia Active Problems:   Hyponatremia   DKA (diabetic ketoacidoses)   63 y.o. female with PMH of HTN, HPL, A fib on AC/couamadin, IDDM, PAD, venous insufficiency chronic leg wounds presented with progressive weakness, worsening of leg ulcers;  -ED found to have Hyper K 7.2, hypo Na -111, sepsis/leg ulcers  1. Electrolytes abnormalities, metabolic AG acidosis; hyper K-7.2, hypo Na-111 in the setting of sepsis, DKA -s/p IV insulin 10 U+Ca, and kayexalate given in ED:  -start IV insulin, cont IVF, albuterol, repeat IV Calcium; BMP Q3 hrs, discussed called nephrology evaluation due to severe hypo Na; hyper K;  -close monitor in SDU; tele monitor; serial ECG,labs   2. DKA; start IV insulin, to help to improve potassium as well;  -close monitor, repeat labs; adjust accordingly   3. Sepsis/sirs/BL leg ulcers, with chronic PAD, venous insufficiency;  -start IV atx, obtain blood cultures, wound cultures, leg x ray r/o underlying bone infection, f/u UA, CXR; obtain ABI; may need vascular surg eval; wound care consult  4. IDDM, complicated with DKA;    -no recent HA1c;  cont IV insulin, check ha1c; adjust accordingly  5. AKI, likely dehydration, worse with diuretics;  -cont IVF, f/u labs; renal US   6. A fib on AC/coumadin;  -cont coumadin per pharmacy, cont BB; tele monitor    Prognosis is guarded; d/w patient, updated her mother at the bedside   Nephrology;  if consultant consulted, please document name and whether formally or informally consulted  Code Status: full (must indicate code status--if unknown or must be presumed, indicate so) Family Communication:  D/w patient, her husband (indicate person spoken with, if applicable, with phone number if by telephone) Disposition Plan: home pend clinical improvement  (indicate anticipated LOS)  Time spent: >35  Esperanza SheetsBURIEV, Vicie Cech N [facility] Pager [phone]  If 7PM-7AM, please contact night-coverage [email or web address] Password [email or web address] 03/16/2014, 1:18 PM

## 2014-03-16 NOTE — ED Notes (Signed)
Sodium 111, potassium 7.2, sample is not hemolyzed. Josh PA notified.

## 2014-03-16 NOTE — Consult Note (Signed)
HPI: Robin [name] is a 63 y.o. female with a hx of HTN, followed by Dr. Harlan Stains.  She reports she was told a few months ago her electrolytes were getting out of whack and to reduce foods high in potassium and drink less.  She likes to drink water and reports that if she could would drink a lot more.  Although she is not sure, currently she consumes at least a gallon of water or more daily.  She reports weakness and the inability to walk and was brought into the ER where she was found to have a BUN of 139, creat of 2.28, bicarb 16, sodium 111 and potassium of 7.2.  She reports taking furosemide PTA.  She has a history of venous insufficiency with stasis ulceration of LEs.  She was given MSO4 in the ED due to leg pains and hx is difficult to obtain due to her sleepiness.  Cr 1.02 in 2011.  She denies nausea vomiting or diarrhea.  Past Medical History  Diagnosis Date  . Diabetes mellitus   . Hypertension   . Hyperlipidemia   . Atrial fibrillation   . Sleep apnea   . Cholelithiasis   . CAD (coronary artery disease)    Past Surgical History  Procedure Laterality Date  . Tonsillectomy  1970s  . Hemorroidectomy  1990s  . Choledochal cyst excision  1980s  . Parathyroidectomy    . Endovenous ablation saphenous vein w/ laser  08-02-2011  Right greater saphenous vein   Social History:  reports that she has never smoked. She has never used smokeless tobacco. She reports that she does not drink alcohol or use illicit drugs. Allergies: No Known Allergies Family History  Problem Relation Age of Onset  . Hypertension Mother   . Heart disease Father 40    MI  . COPD Father   . Other Brother     heart issues  . Heart disease Brother   . Other Brother     heart issues  . Heart disease Brother   . Drug abuse Brother     Medications:  Scheduled: . albuterol  2.5 mg Nebulization Once  . buPROPion  300 mg Oral Daily  . calcium gluconate  1 g Intravenous Once  . insulin aspart  0-9 Units  Subcutaneous 6 times per day  . metoprolol tartrate  25 mg Oral BID  . piperacillin-tazobactam  3.375 g Intravenous Once  . piperacillin-tazobactam (ZOSYN)  IV  3.375 g Intravenous Q8H  . simvastatin  20 mg Oral q1800  . sodium chloride  3 mL Intravenous Q12H  . [START ON 03/18/2014] vancomycin  2,000 mg Intravenous Q48H  . warfarin  5 mg Oral ONCE-1800  . Warfarin - Pharmacist Dosing Inpatient   Does not apply q1800   ROS: Diff to obtain due to her lethargy  Blood pressure 89/52, pulse 81, temperature 98.5 F (36.9 C), temperature source Oral, resp. rate 15, height _0  (1.676 m), weight 107 kg (235 lb 14.3 oz), SpO2 98.00%.  General appearance: slowed mentation and sleepy Head: Normocephalic, without obvious abnormality, atraumatic Eyes: negative Ears: normal TM's and external ear canals both ears Nose: Nares normal. Septum midline. Mucosa normal. No drainage or sinus tenderness. Throat: lips, mucosa, and tongue normal; teeth and gums normal Resp: clear to auscultation bilaterally Chest wall: no tenderness Cardio: regular rate and rhythm, S1, S2 normal, no murmur, click, rub or gallop GI: soft, non-tender; bowel sounds normal; no masses,  no organomegaly Extremities: venous stasis  dermatitis noted Skin: chronic changes of legs but no significant edema Neurologic: Grossly normal  sleepy Results for orders placed during the hospital encounter of 03/16/14 (from the past 48 hour(s))  CBC WITH DIFFERENTIAL     Status: Abnormal   Collection Time    03/16/14 11:23 AM      Result Value Ref Range   WBC 21.6 (*) 4.0 - 10.5 K/uL   RBC 5.29 (*) 3.87 - 5.11 MIL/uL   Hemoglobin 11.0 (*) 12.0 - 15.0 g/dL   HCT 34.8 (*) 36.0 - 46.0 %   MCV 65.8 (*) 78.0 - 100.0 fL   MCH 20.8 (*) 26.0 - 34.0 pg   MCHC 31.6  30.0 - 36.0 g/dL   RDW 18.0 (*) 11.5 - 15.5 %   Platelets 563 (*) 150 - 400 K/uL   Neutrophils Relative % 93 (*) 43 - 77 %   Neutro Abs 20.2 (*) 1.7 - 7.7 K/uL   Lymphocytes Relative 3  (*) 12 - 46 %   Lymphs Abs 0.6 (*) 0.7 - 4.0 K/uL   Monocytes Relative 4  3 - 12 %   Monocytes Absolute 0.9  0.1 - 1.0 K/uL   Eosinophils Relative 0  0 - 5 %   Eosinophils Absolute 0.0  0.0 - 0.7 K/uL   Basophils Relative 0  0 - 1 %   Basophils Absolute 0.0  0.0 - 0.1 K/uL  BASIC METABOLIC PANEL     Status: Abnormal   Collection Time    03/16/14 11:23 AM      Result Value Ref Range   Sodium 111 (*) 137 - 147 mEq/L   Comment: CRITICAL RESULT CALLED TO, READ BACK BY AND VERIFIED WITH:     K.DOSS,RN 1214 03/16/14 M.CAMPBELL   Potassium 7.2 (*) 3.7 - 5.3 mEq/L   Comment: NO VISIBLE HEMOLYSIS     CRITICAL RESULT CALLED TO, READ BACK BY AND VERIFIED WITH:     K.DOSS,RN 1214 03/16/14 M.CAMPBELL   Chloride 76 (*) 96 - 112 mEq/L   CO2 16 (*) 19 - 32 mEq/L   Glucose, Bld 217 (*) 70 - 99 mg/dL   BUN 139 (*) 6 - 23 mg/dL   Creatinine, Ser 2.28 (*) 0.50 - 1.10 mg/dL   Calcium 9.4  8.4 - 10.5 mg/dL   GFR calc non Af Amer 22 (*) >90 mL/min   GFR calc Af Amer 25 (*) >90 mL/min   Comment: (NOTE)     The eGFR has been calculated using the CKD EPI equation.     This calculation has not been validated in all clinical situations.     eGFR's persistently <90 mL/min signify possible Chronic Kidney     Disease.  PROTIME-INR     Status: Abnormal   Collection Time    03/16/14 11:23 AM      Result Value Ref Range   Prothrombin Time 24.7 (*) 11.6 - 15.2 seconds   INR 2.32 (*) 0.00 - 1.49  POTASSIUM     Status: Abnormal   Collection Time    03/16/14 12:27 PM      Result Value Ref Range   Potassium 7.4 (*) 3.7 - 5.3 mEq/L   Comment: CRITICAL RESULT CALLED TO, READ BACK BY AND VERIFIED WITH:     C.DOSS,RN 1325 03/16/14 M.CAMPBELL  I-STAT CG4 LACTIC ACID, ED     Status: None   Collection Time    03/16/14  1:24 PM      Result Value Ref Range  Lactic Acid, Venous 1.20  0.5 - 2.2 mmol/L  CBG MONITORING, ED     Status: Abnormal   Collection Time    03/16/14  2:35 PM      Result Value Ref Range    Glucose-Capillary 177 (*) 70 - 99 mg/dL   No results found.  Assessment:  1 Severe hyponatremia, ? Symptomatic, / due to excess water consumption 2 Hyperkalemia, symptomatic 3 Met acidosis Plan: 1 Cortisol and thyroid levels 2 Bicarb with 225mq Na per liter 3 Hold diuretics 4 Limit fluid PO/free H20 5 UA and lytes Infantof Villagomez C 03/16/2014, 4:54 PM

## 2014-03-16 NOTE — ED Notes (Signed)
Pt from home via [facility] with c/o increasing weakness x 1-2 weeks.  Pt reports having multiple chronic wounds and she believes is now infected.  Pt stopped going to the wound care clinic around 6 months ago.  Pt in NAD, A&O.

## 2014-03-17 LAB — BASIC METABOLIC PANEL
BUN: 114 mg/dL — ABNORMAL HIGH (ref 6–23)
BUN: 109 mg/dL — AB (ref 6–23)
BUN: 108 mg/dL — AB (ref 6–23)
BUN: 108 mg/dL — ABNORMAL HIGH (ref 6–23)
BUN: 103 mg/dL — AB (ref 6–23)
BUN: 95 mg/dL — ABNORMAL HIGH (ref 6–23)
CALCIUM: 8.5 mg/dL (ref 8.4–10.5)
CALCIUM: 8.2 mg/dL — AB (ref 8.4–10.5)
CHLORIDE: 86 meq/L — AB (ref 96–112)
CHLORIDE: 86 meq/L — AB (ref 96–112)
CHLORIDE: 84 meq/L — AB (ref 96–112)
CO2: 22 mEq/L (ref 19–32)
CO2: 18 mEq/L — ABNORMAL LOW (ref 19–32)
CO2: 22 mEq/L (ref 19–32)
CO2: 21 mEq/L (ref 19–32)
CO2: 24 mEq/L (ref 19–32)
CO2: 24 meq/L (ref 19–32)
CREATININE: 1.85 mg/dL — AB (ref 0.50–1.10)
CREATININE: 1.74 mg/dL — AB (ref 0.50–1.10)
CREATININE: 1.76 mg/dL — AB (ref 0.50–1.10)
Calcium: 8.5 mg/dL (ref 8.4–10.5)
Calcium: 8.2 mg/dL — ABNORMAL LOW (ref 8.4–10.5)
Calcium: 7.9 mg/dL — ABNORMAL LOW (ref 8.4–10.5)
Calcium: 7.7 mg/dL — ABNORMAL LOW (ref 8.4–10.5)
Chloride: 86 mEq/L — ABNORMAL LOW (ref 96–112)
Chloride: 87 mEq/L — ABNORMAL LOW (ref 96–112)
Chloride: 84 mEq/L — ABNORMAL LOW (ref 96–112)
Creatinine, Ser: 1.83 mg/dL — ABNORMAL HIGH (ref 0.50–1.10)
Creatinine, Ser: 1.75 mg/dL — ABNORMAL HIGH (ref 0.50–1.10)
Creatinine, Ser: 1.64 mg/dL — ABNORMAL HIGH (ref 0.50–1.10)
GFR calc Af Amer: 33 mL/min — ABNORMAL LOW (ref >90)
GFR calc Af Amer: 33 mL/min — ABNORMAL LOW (ref >90)
GFR calc Af Amer: 35 mL/min — ABNORMAL LOW (ref >90)
GFR calc Af Amer: 35 mL/min — ABNORMAL LOW (ref >90)
GFR calc Af Amer: 38 mL/min — ABNORMAL LOW (ref >90)
GFR calc non Af Amer: 29 mL/min — ABNORMAL LOW (ref >90)
GFR calc non Af Amer: 30 mL/min — ABNORMAL LOW (ref >90)
GFR calc non Af Amer: 33 mL/min — ABNORMAL LOW (ref >90)
GFR, EST AFRICAN AMERICAN: 35 mL/min — AB (ref >90)
GFR, EST NON AFRICAN AMERICAN: 28 mL/min — AB (ref >90)
GFR, EST NON AFRICAN AMERICAN: 30 mL/min — AB (ref >90)
GFR, EST NON AFRICAN AMERICAN: 30 mL/min — AB (ref >90)
GLUCOSE: 182 mg/dL — AB (ref 70–99)
GLUCOSE: 159 mg/dL — AB (ref 70–99)
GLUCOSE: 185 mg/dL — AB (ref 70–99)
Glucose, Bld: 202 mg/dL — ABNORMAL HIGH (ref 70–99)
Glucose, Bld: 201 mg/dL — ABNORMAL HIGH (ref 70–99)
Glucose, Bld: 185 mg/dL — ABNORMAL HIGH (ref 70–99)
POTASSIUM: 4.4 meq/L (ref 3.7–5.3)
POTASSIUM: 4 meq/L (ref 3.7–5.3)
POTASSIUM: 4 meq/L (ref 3.7–5.3)
Potassium: 4.3 mEq/L (ref 3.7–5.3)
Potassium: 3.5 mEq/L — ABNORMAL LOW (ref 3.7–5.3)
Potassium: 3 mEq/L — ABNORMAL LOW (ref 3.7–5.3)
Sodium: 123 mEq/L — ABNORMAL LOW (ref 137–147)
Sodium: 124 mEq/L — ABNORMAL LOW (ref 137–147)
Sodium: 124 mEq/L — ABNORMAL LOW (ref 137–147)
Sodium: 124 mEq/L — ABNORMAL LOW (ref 137–147)
Sodium: 124 mEq/L — ABNORMAL LOW (ref 137–147)
Sodium: 123 mEq/L — ABNORMAL LOW (ref 137–147)

## 2014-03-17 LAB — CBC
HEMATOCRIT: 28.4 % — AB (ref 36.0–46.0)
Hemoglobin: 8.8 g/dL — ABNORMAL LOW (ref 12.0–15.0)
MCH: 20.9 pg — ABNORMAL LOW (ref 26.0–34.0)
MCHC: 31 g/dL (ref 30.0–36.0)
MCV: 67.5 fL — AB (ref 78.0–100.0)
PLATELETS: 419 10*3/uL — AB (ref 150–400)
RBC: 4.21 MIL/uL (ref 3.87–5.11)
RDW: 18.4 % — ABNORMAL HIGH (ref 11.5–15.5)
WBC: 13.4 10*3/uL — ABNORMAL HIGH (ref 4.0–10.5)

## 2014-03-17 LAB — T4, FREE: FREE T4: 1.3 ng/dL (ref 0.80–1.80)

## 2014-03-17 LAB — GLUCOSE, CAPILLARY
GLUCOSE-CAPILLARY: 158 mg/dL — AB (ref 70–99)
GLUCOSE-CAPILLARY: 209 mg/dL — AB (ref 70–99)
Glucose-Capillary: 171 mg/dL — ABNORMAL HIGH (ref 70–99)

## 2014-03-17 LAB — PROTIME-INR
INR: 2.93 — ABNORMAL HIGH (ref 0.00–1.49)
Prothrombin Time: 29.5 seconds — ABNORMAL HIGH (ref 11.6–15.2)

## 2014-03-17 LAB — CREATININE, URINE, RANDOM: Creatinine, Urine: 83.08 mg/dL

## 2014-03-17 LAB — TSH: TSH: 1.55 u[IU]/mL (ref 0.350–4.500)

## 2014-03-17 LAB — OSMOLALITY, URINE: OSMOLALITY UR: 424 mosm/kg (ref 390–1090)

## 2014-03-17 LAB — SODIUM, URINE, RANDOM

## 2014-03-17 LAB — CORTISOL: Cortisol, Plasma: 18.7 ug/dL

## 2014-03-17 MED ORDER — INSULIN ASPART 100 UNIT/ML ~~LOC~~ SOLN
0.0000 [IU] | Freq: Three times a day (TID) | SUBCUTANEOUS | Status: DC
  Administered 2014-03-17: 3 [IU] via SUBCUTANEOUS
  Administered 2014-03-17 – 2014-03-18 (×4): 2 [IU] via SUBCUTANEOUS
  Administered 2014-03-19: 3 [IU] via SUBCUTANEOUS
  Administered 2014-03-19: 2 [IU] via SUBCUTANEOUS
  Administered 2014-03-19: 1 [IU] via SUBCUTANEOUS
  Administered 2014-03-20: 2 [IU] via SUBCUTANEOUS
  Administered 2014-03-20 – 2014-03-21 (×4): 1 [IU] via SUBCUTANEOUS
  Administered 2014-03-22: 2 [IU] via SUBCUTANEOUS
  Administered 2014-03-22: 1 [IU] via SUBCUTANEOUS

## 2014-03-17 MED ORDER — WARFARIN SODIUM 2.5 MG PO TABS
2.5000 mg | ORAL_TABLET | Freq: Once | ORAL | Status: AC
Start: 2014-03-17 — End: 2014-03-17
  Administered 2014-03-17: 2.5 mg via ORAL
  Filled 2014-03-17: qty 1

## 2014-03-17 MED ORDER — SODIUM CHLORIDE 0.9 % IV SOLN
INTRAVENOUS | Status: DC
  Administered 2014-03-17 – 2014-03-18 (×3): via INTRAVENOUS
  Administered 2014-03-19: 10 mL/h via INTRAVENOUS

## 2014-03-17 MED ORDER — METOPROLOL TARTRATE 25 MG PO TABS
25.0000 mg | ORAL_TABLET | Freq: Two times a day (BID) | ORAL | Status: DC
  Administered 2014-03-18: 25 mg via ORAL
  Filled 2014-03-17 (×3): qty 1

## 2014-03-17 MED ORDER — INSULIN GLARGINE 100 UNIT/ML ~~LOC~~ SOLN
30.0000 [IU] | Freq: Every day | SUBCUTANEOUS | Status: DC
  Administered 2014-03-17 – 2014-03-21 (×5): 30 [IU] via SUBCUTANEOUS
  Filled 2014-03-17 (×7): qty 0.3

## 2014-03-17 MED ORDER — OXYCODONE HCL 5 MG PO TABS
5.0000 mg | ORAL_TABLET | Freq: Once | ORAL | Status: AC
  Administered 2014-03-17: 5 mg via ORAL
  Filled 2014-03-17: qty 1

## 2014-03-17 NOTE — Progress Notes (Signed)
[name] - Stepdown/ICU TEAM Progress Note  [name] ZOX:[id number] DOB: 04/24/1951 DOA: 03/16/2014 PCP: Cala BradfordWHITE,[name], MD  Admit HPI / Brief Narrative: 63 y.o. female with PMH of HTN, HLD, A fib on coumadin, IDDM, PAD, and venous insufficiency w/ chronic leg wounds who presented with progressive weakness, and worsening of leg ulcers.  In the ED she was found to have a K+ of 7.2, and a Na+ of 111.  She was given NS, IV insulin + d50, and kayexalate.  She denied focal neurological symptoms, but was fatigued with generalized weakness; no chest pain, no SOB, no cough, no nausea, vomiting or diarrhea.  HPI/Subjective: Pt is alert and conversant.  She states she feel much better in general.  She now denies consuming excessive amounts of water daily, and reports that she feels she was misunderstood.  In fact she states she keeps an eye on how much she drinks "like doctors have told me to do."  She denies cp, n/v, or abdom pain.  No diarrhea.  Assessment/Plan:  Severe hyponatremia Possibly due to excessive water consumption, though this hx is now in question - Na has improved 13meq over ~24hrs - cont current interventions and follow to assure does not correct any more rapidly - urine studies not c/w SIADH, but could be affected by lasix use - cont current tx and follow    Severe hyperkalemia K+ 7.2 at admit - now normalized at 4.3 w/ bicarb gtt   Metabolic acidosis  Likely due to renal failure - improved w/ bicarb gtt - follow   Acute kidney injury  BUN of 139, creat of 2.28 at presentation - crt was 1.02 in 2011 - etiology unclear - Nephrology following - renal US w/o acute findings   Sepsis - B leg ulcers Cont empiric abx - sepsis physiology improving   DM Follow CBG closely - check A1c  Hx of HTN  HLD  Chronic Afib on coumadin Rate well controlled   MRSA screen +  Code Status: FULL Family Communication: no family present at time of exam Disposition Plan:  SDU  Consultants: Nephrology  Procedures: none  Antibiotics: Vanc 6/13 >> Zosyn 6/13 >>  DVT prophylaxis: warfarin  Objective: Blood pressure 98/31, pulse 64, temperature 97.3 F (36.3 C), temperature source Oral, resp. rate 18, height 5\' 6"  (1.676 m), weight 110.5 kg (243 lb 9.7 oz), SpO2 100.00%.  Intake/Output Summary (Last 24 hours) at 03/17/14 0848 Last data filed at 03/17/14 0804  Gross per 24 hour  Intake    600 ml  Output   1850 ml  Net  -1250 ml   Exam: General: No acute respiratory distress - alert and conversant - oriented x4 Lungs: Clear to auscultation bilaterally without wheezes or crackles - bs distant  Cardiovascular: Regular rate with irreg rhythm - HS distant - no appreciable M  Abdomen: obese, soft, apparent abdom wall herniation in LLQ w/o tenderness on exam, bs+, no rebound  Extremities: multiple wounds B LE - dressed with some weeping - no signif edema   Data Reviewed: Basic Metabolic Panel:  Recent Labs Lab 03/16/14 1123 03/16/14 1227 03/16/14 1840 03/16/14 2136 03/17/14 0105 03/17/14 0342  NA 111*  --  118* 120* 123* 124*  K 7.2* 7.4* 6.1* 5.4* 4.4 4.3  CL 76*  --  85* 86* 86* 86*  CO2 16*  --  15* 16* 22 18*  GLUCOSE 217*  --  176* 173* 182* 202*  BUN 139*  --  123*  117* 114* 109*  CREATININE 2.28*  --  1.89* 1.97* 1.85* 1.83*  CALCIUM 9.4  --  8.8 9.1 8.5 8.5   Liver Function Tests: No results found for this basename: AST, ALT, ALKPHOS, BILITOT, PROT, ALBUMIN,  in the last 168 hours  CBC:  Recent Labs Lab 03/16/14 1123 03/17/14 0342  WBC 21.6* 13.4*  NEUTROABS 20.2*  --   HGB 11.0* 8.8*  HCT 34.8* 28.4*  MCV 65.8* 67.5*  PLT 563* 419*   CBG:  Recent Labs Lab 03/16/14 1435 03/16/14 2016 03/17/14 0757  GLUCAP 177* 191* 158*    Recent Results (from the past 240 hour(s))  MRSA PCR SCREENING     Status: Abnormal   Collection Time    03/16/14  3:20 PM      Result Value Ref Range Status   MRSA by PCR POSITIVE (*)  NEGATIVE Final   Comment:            The GeneXpert MRSA Assay (FDA     approved for NASAL specimens     only), is one component of a     comprehensive MRSA colonization     surveillance program. It is not     intended to diagnose MRSA     infection nor to guide or     monitor treatment for     MRSA infections.     RESULT CALLED TO, READ BACK BY AND VERIFIED WITH:     A.AFACSAWO,RN 1719 03/16/14 M.CAMPBELL  WOUND CULTURE     Status: None   Collection Time    03/16/14  3:48 PM      Result Value Ref Range Status   Specimen Description WOUND   Final   Special Requests RIGHT LEG   Final   Gram Stain PENDING   Incomplete   Culture     Final   Value: NO GROWTH 1 DAY     Performed at [facility]   Report Status PENDING   Incomplete  WOUND CULTURE     Status: None   Collection Time    03/16/14  5:08 PM      Result Value Ref Range Status   Specimen Description WOUND   Final   Special Requests LEFT LEG   Final   Gram Stain PENDING   Incomplete   Culture     Final   Value: Culture reincubated for better growth     Performed at [facility]   Report Status PENDING   Incomplete     Studies:  Recent x-ray studies have been reviewed in detail by the Attending Physician  Scheduled Meds:  Scheduled Meds: . albuterol  2.5 mg Nebulization Once  . buPROPion  300 mg Oral Daily  . Chlorhexidine Gluconate Cloth  6 each Topical Q0600  . insulin aspart  0-9 Units Subcutaneous 6 times per day  . metoprolol tartrate  25 mg Oral BID  . mupirocin ointment  1 application Nasal BID  . piperacillin-tazobactam (ZOSYN)  IV  3.375 g Intravenous Q8H  . simvastatin  20 mg Oral q1800  . sodium chloride  3 mL Intravenous Q12H  . [START ON 03/18/2014] vancomycin  2,000 mg Intravenous Q48H  . Warfarin - Pharmacist Dosing Inpatient   Does not apply q1800    Time spent on care of this patient: 35 mins   [facility] T , MD   [facility] Office  [phone] Pager - Text  Page per Loretha Stapler as per below:  On-Call/Text Page:  [email or web address]      password [email or web address]  If 7PM-7AM, please contact night-coverage [email or web address] Password [email or web address] 03/17/2014, 8:48 AM   LOS: 1 day

## 2014-03-17 NOTE — Evaluation (Signed)
Physical Therapy Evaluation Patient Details Name: Robin BaizeJudith M Coker MRN: [id number] DOB: 10/06/1950 Today's Date: 03/17/2014   History of Present Illness  Pt admit with bil LE cellulitis.  Afib.  Clinical Impression  Pt admitted with above. Pt currently with functional limitations due to the deficits listed below (see PT Problem List).  Pt will benefit from skilled PT to increase their independence and safety with mobility to allow discharge to the venue listed below.     Follow Up Recommendations CIR;Supervision/Assistance - 24 hour    Equipment Recommendations  Other (comment) (TBA)    Recommendations for Other Services Rehab consult     Precautions / Restrictions Precautions Precautions: Fall Restrictions Weight Bearing Restrictions: No      Mobility  Bed Mobility Overal bed mobility: Needs Assistance;+2 for physical assistance Bed Mobility: Rolling Rolling: Mod assist;+2 for physical assistance         General bed mobility comments: painful and need for 2 people secondary to pt with difficulty moving. Bed level eval due to pt's pain.   Transfers                    Ambulation/Gait                Stairs            Wheelchair Mobility    Modified Rankin (Stroke Patients Only)       Balance                                             Pertinent Vitals/Pain HR 69-73 bpm, BP 105/37 and 94/44 at departure.  99% O2 sats on RA.  Pain bil LES 10/10.    Home Living Family/patient expects to be discharged to:: Private residence Living Arrangements: Spouse/significant other;Children Available Help at Discharge: Family;Available PRN/intermittently (son and spouse) Type of Home: House Home Access: Stairs to enter Entrance Stairs-Rails: Can reach both Entrance Stairs-Number of Steps: 3 Home Layout: Two level;Able to live on main level with bedroom/bathroom Home Equipment: Gilmer MorCane - single point;Wheelchair - manual Additional  Comments: used wheelchair for last three days    Prior Function Level of Independence: Independent with assistive device(s);Needs assistance   Gait / Transfers Assistance Needed: used cane up until a week ago  ADL's / Homemaking Assistance Needed: dressing - wore lounger and could put it on.  husband helped with socks; bathing - sponge bathing recently with assist; showered indpendently before getting sick.        Hand Dominance   Dominant Hand: Right    Extremity/Trunk Assessment   Upper Extremity Assessment: Defer to OT evaluation           Lower Extremity Assessment: RLE deficits/detail;LLE deficits/detail RLE Deficits / Details: grossly 2-/5 LLE Deficits / Details: grossly 3-/5     Communication   Communication: No difficulties  Cognition Arousal/Alertness: Awake/alert Behavior During Therapy: WFL for tasks assessed/performed Overall Cognitive Status: Within Functional Limits for tasks assessed                      General Comments General comments (skin integrity, edema, etc.): Bil LEs with edema and erythema, propped onpillows with open oozing wounds.      Exercises General Exercises - Upper Extremity Shoulder Flexion: AAROM;Both;10 reps;Supine Shoulder ABduction: AAROM;Both;10 reps;Supine Shoulder Horizontal ABduction: AAROM;Both;10 reps;Supine Elbow Flexion: AROM;Both;10 reps;Supine General Exercises -  Lower Extremity Ankle Circles/Pumps: AROM;Both;Supine;10 reps Quad Sets: AROM;Both;10 reps;Supine Heel Slides: AAROM;5 reps;Left;Supine      Assessment/Plan    PT Assessment Patient needs continued PT services  PT Diagnosis Generalized weakness;Acute pain   PT Problem List Decreased activity tolerance;Decreased strength;Decreased range of motion;Decreased balance;Decreased mobility;Decreased knowledge of use of DME;Decreased safety awareness;Decreased knowledge of precautions;Pain  PT Treatment Interventions DME instruction;Gait  training;Functional mobility training;Therapeutic activities;Therapeutic exercise;Balance training;Patient/family education   PT Goals (Current goals can be found in the Care Plan section) Acute Rehab PT Goals Patient Stated Goal: to get therapy and then go home PT Goal Formulation: With patient Time For Goal Achievement: 03/31/14 Potential to Achieve Goals: Good    Frequency Min 3X/week   Barriers to discharge Decreased caregiver support      Co-evaluation               End of Session   Activity Tolerance: Patient limited by fatigue;Patient limited by pain Patient left: in bed;with call bell/phone within reach Nurse Communication: Mobility status         Time: [id number] PT Time Calculation (min): 32 min   Charges:         PT G Codes:          INGOLD,Aamirah Salmi 03/17/2014, 3:12 PM [facility] IncDawn Ingold,PT Acute Rehabilitation [phone] (pager)

## 2014-03-17 NOTE — Consult Note (Signed)
WOC wound consult note Reason for Consult: Patient with chronic, non-healing LE ulcerations.  Reports she has been seen in the outpatient wound are center by Dr. Wayland Denislaire Sanger, but has not been to the clinic in months.  She states that, "Dr.Sanger told me that nothing more could be done for my legs."  Presents also with intertriginous dermatitis (suspect fungal) beneath breasts and beneath abdominal panus, a full thickness ulceration on the upper right medial thigh near the labia and a pressure ulcer, Stage III, at the right buttock at the gluteal cleft. Wound type: Intertriginous dermatitis with suspected fungal overgrowth and ulceration at right medial thigh near perineum, pressure ulcer at right buttock at gluteal cleft and chronic LE ulcerations, suspect mixed etiology Pressure Ulcer POA: Yes (at right buttock) Measurement:Right buttock, Stage III PrU measuring 2cm x 2cm x 0.2cm with 40% necrotic yellow slough and 60% red, moist tissue.  Right inner thigh, proximal and near perineum:  2.5cm x 3cm x 0.2cm full thickness tissue loss with mosist red tissue.  Right LE with numerous pinpoint open areas and two ulcers:  Distal full thickness ulcer measures 3cm x 1cm x 0.2cm, Proximal full thickness ulceration measures 2cm x 2.5cm x 0.2cm.  Left LE with numerous pinpoint ulcerations and 1 ulcer at the lateral malleolus measuring 3cm x 2cm x 0.2cm. All LE ulcers are moist, red and clear of necrotic tissue. Wound bed:As described above. Drainage (amount, consistency, odor) Serosanguinous exudate  Periwound:No LE edema, LEs with what appears to be hemosiderin staining vs rubor. Periwound in perineum is red, moist and with some evidence of epidermal peeling suggestive of fungal overgrowth. Dressing procedure/placement/frequency: I will suggest a non-adherent dressing to the bilateral LEs (white petrolatum with padding of the legs and securement of the dressings with Kerlix changed once daily. If you feel that  further workup is indicated, you might consult VVS service for continuing care.  Under their direction, a referral to an outpatient wound care center may or may not be indicated. I have provided bilateral Prevalon Boots for pressure redistribution while in bed.I have also provided an antimicrobial textile for treatment of the intertriginous dermatitis at the panus and inframammary areas.  A topical zinc-oxide paste is provided for the open area at the right medial and proximal thigh near the perineum.  A soft silicone foam dressing is provided (as well as a therapeutic mattress replacement with low air loss feature) for treatment of the Stage III pressure ulcer. WOC nursing team will not follow, but will remain available to this patient, the nursing and medical teams.  Please re-consult if needed. Thanks, Ladona MowLaurie Cire Clute, MSN, RN, GNP, [place], CWON-AP [phone])

## 2014-03-17 NOTE — Progress Notes (Signed)
ANTIBIOTIC CONSULT NOTE - INITIAL  Pharmacy Consult for Coumadin Indication: Afib   No Known Allergies  Patient Measurements: Height: 5\' 6"  (167.6 cm) Weight: 243 lb 9.7 oz (110.5 kg) IBW/kg (Calculated) : 59.3 Adjusted Body Weight: 134.3kg  Vital Signs: Temp: 97.3 F (36.3 C) (06/14 0754) Temp src: Oral (06/14 0754) BP: 104/43 mmHg (06/14 1200) Pulse Rate: 66 (06/14 1200) Intake/Output from previous day: 06/13 0701 - 06/14 0700 In: 600 [I.V.:500; IV Piggyback:100] Out: 1350 [Urine:1350] Intake/Output from this shift: Total I/O In: 780 [P.O.:480; I.V.:300] Out: 500 [Urine:500]  Labs:  Recent Labs  03/16/14 1123  03/16/14 2136 03/16/14 2237  03/17/14 0342 03/17/14 0744 03/17/14 0958  WBC 21.6*  --   --   --   --  13.4*  --   --   HGB 11.0*  --   --   --   --  8.8*  --   --   PLT 563*  --   --   --   --  419*  --   --   LABCREA  --   --   --  83.08  --   --   --   --   CREATININE 2.28*  < > 1.97*  --   < > 1.83* 1.74* 1.75*  < > = values in this interval not displayed. Estimated Creatinine Clearance: 42.5 ml/min (by C-G formula based on Cr of 1.75). No results found for this basename: VANCOTROUGH, VANCOPEAK, VANCORANDOM, GENTTROUGH, GENTPEAK, GENTRANDOM, TOBRATROUGH, TOBRAPEAK, TOBRARND, AMIKACINPEAK, AMIKACINTROU, AMIKACIN,  in the last 72 hours   Microbiology: Recent Results (from the past 720 hour(s))  CULTURE, BLOOD (ROUTINE X 2)     Status: None   Collection Time    03/16/14  1:35 PM      Result Value Ref Range Status   Specimen Description BLOOD LEFT HAND   Final   Special Requests BOTTLES DRAWN AEROBIC AND ANAEROBIC 10CC   Final   Culture  Setup Time     Final   Value: 03/16/2014 18:20     [facility]   Culture     Final   Value:        BLOOD CULTURE RECEIVED NO GROWTH TO DATE CULTURE WILL BE HELD FOR 5 DAYS BEFORE ISSUING A FINAL NEGATIVE REPORT     [facility]   Report Status PENDING   Incomplete   CULTURE, BLOOD (ROUTINE X 2)     Status: None   Collection Time    03/16/14  1:45 PM      Result Value Ref Range Status   Specimen Description BLOOD LEFT ARM   Final   Special Requests BOTTLES DRAWN AEROBIC AND ANAEROBIC 10CC   Final   Culture  Setup Time     Final   Value: 03/16/2014 18:20     [facility]   Culture     Final   Value:        BLOOD CULTURE RECEIVED NO GROWTH TO DATE CULTURE WILL BE HELD FOR 5 DAYS BEFORE ISSUING A FINAL NEGATIVE REPORT     [facility]   Report Status PENDING   Incomplete  MRSA PCR SCREENING     Status: Abnormal   Collection Time    03/16/14  3:20 PM      Result Value Ref Range Status   MRSA by PCR POSITIVE (*) NEGATIVE Final   Comment:  The GeneXpert MRSA Assay (FDA     approved for NASAL specimens     only), is one component of a     comprehensive MRSA colonization     surveillance program. It is not     intended to diagnose MRSA     infection nor to guide or     monitor treatment for     MRSA infections.     RESULT CALLED TO, READ BACK BY AND VERIFIED WITH:     A.AFACSAWO,RN 1719 03/16/14 M.CAMPBELL  WOUND CULTURE     Status: None   Collection Time    03/16/14  3:48 PM      Result Value Ref Range Status   Specimen Description WOUND   Final   Special Requests RIGHT LEG   Final   Gram Stain PENDING   Incomplete   Culture     Final   Value: NO GROWTH 1 DAY     [facility]formed at [facility]   Report Status PENDING   Incomplete  WOUND CULTURE     Status: None   Collection Time    03/16/14  5:08 PM      Result Value Ref Range Status   Specimen Description WOUND   Final   Special Requests LEFT LEG   Final   Gram Stain PENDING   Incomplete   Culture     Final   Value: Culture reincubated for better growth     [facility] at [facility]   Report Status PENDING   Incomplete    Medical History: Past Medical History  Diagnosis Date  . Diabetes mellitus   . Hypertension    . Hyperlipidemia   . Atrial fibrillation   . Sleep apnea   . Cholelithiasis   . CAD (coronary artery disease)    Assessment: 63 yo lady admitted 6/13 with PMH of HTN, HPL, A fib on AC/coumadin, IDDM, PAD, venous insufficiency chronic leg wounds presented with progressive weakness, worsening of leg ulcers to start broad spectrum antibiotics. INR 2.32 on admit. Electrolyte abnormalities K7.4, SCr 2.28, Na 111, Gluc 217, CO2 15. Pharmacy consulted to dose coumadin.   Coumadin PTA 5mg  daily, INR today 2.93.  CBC 8.8, Plts 419  Goal of Therapy:  INR 2-3  Plan:  1) Coumadin 2.5 x 1 tonight 2) Daily INR  Emilina Smarr B. Artelia Larocheung, PharmD Clinical Pharmacist - Resident Phone: [phone] Pager: [phone] 03/17/2014 2:59 PM

## 2014-03-17 NOTE — Progress Notes (Signed)
Assessment:  1 Severe hyponatremia, prob due to excess water consumption on top of AKI, improved 2 Hyperkalemia, symptomatic with weakness-improved  3 Met acidosis, better prob due to obstruction  4 Urinary retention with AKI, ?CKD 5 Anemia Plan:  1 Cont Bicarb with 223mq Na per liter until bag complete, then NS 2 Limit fluid PO/free H20  3 Urethral cath drainage  Subjective: Interval History: Urinary retention with 1350cc drained from bladder via urethral cath last PM, she reports she overestimated her water consumption yesterday.  Objective: Vital signs in last 24 hours: Temp:  [97 F (36.1 C)-98.5 F (36.9 C)] 97.3 F (36.3 C) (06/14 0754) Pulse Rate:  [51-81] 58 (06/14 0944) Resp:  [15-24] 17 (06/14 0944) BP: (88-130)/(31-71) 99/42 mmHg (06/14 0944) SpO2:  [98 %-100 %] 100 % (06/14 0944) Weight:  [107 kg (235 lb 14.3 oz)-110.5 kg (243 lb 9.7 oz)] 110.5 kg (243 lb 9.7 oz) (06/14 0419) Weight change:   Intake/Output from previous day: 06/13 0701 - 06/14 0700 In: 600 [I.V.:500; IV Piggyback:100] Out: 1350 [Urine:1350] Intake/Output this shift: Total I/O In: 300 [I.V.:300] Out: 500 [Urine:500]  General appearance: alert and cooperative GI: soft, non-tender; bowel sounds normal; no masses,  no organomegaly and diastasus rectus defect Extremities: chronic changes  Lab Results:  Recent Labs  03/16/14 1123 03/17/14 0342  WBC 21.6* 13.4*  HGB 11.0* 8.8*  HCT 34.8* 28.4*  PLT 563* 419*   BMET:  Recent Labs  03/17/14 0744 03/17/14 0958  NA 124* 124*  K 4.0 4.0  CL 87* 86*  CO2 22 21  GLUCOSE 159* 185*  BUN 108* 108*  CREATININE 1.74* 1.75*  CALCIUM 8.2* 8.2*   No results found for this basename: PTH,  in the last 72 hours Iron Studies: No results found for this basename: IRON, TIBC, TRANSFERRIN, FERRITIN,  in the last 72 hours Studies/Results: Dg Chest 1 View  03/16/2014   CLINICAL DATA:  Diabetes.  EXAM: CHEST - 1 VIEW  COMPARISON:  None.  FINDINGS:  Mediastinum and hilar structures normal. Mild cardiomegaly with normal pulmonary vascularity. Bibasilar subsegmental atelectasis present. Small bilateral pleural effusions cannot be excluded. No acute bony abnormality .  IMPRESSION: 1. Low lung volumes with mild bibasilar atelectasis. Small pleural effusions cannot be excluded.  2. Mild cardiomegaly, no significant pulmonary venous congestion.   Electronically Signed   By: TMarcello Moores Register   On: 03/16/2014 18:29   [place]  03/16/2014   CLINICAL DATA:  Acute renal failure.  EXAM: RENAL/URINARY TRACT ULTRASOUND COMPLETE  COMPARISON:  None.  FINDINGS: Right Kidney:  Length: 13.0 cm. Cortical thinning. Right kidney is slightly echogenic suggesting chronic medical renal disease.  Left Kidney:  Length: 12.1 cm. Cortical thinning. Left kidney is echogenic suggesting chronic medical renal disease.  Bladder:  Bladder is slightly distended. Patient unable to void. Bladder volume is 814.9 cc.  This was a limited exam due to patient's body habitus and patient's inability to turn.  IMPRESSION: 1. Renal cortical thinning and increased echogenicity suggesting chronic medical renal disease. No hydronephrosis. 2. Mild bladder distention.  Patient unable to void.   Electronically Signed   By: TMarcello Moores Register   On: 03/16/2014 18:10   Dg Foot 2 Views Left  03/16/2014   CLINICAL DATA:  Ulcers.  Rule out osteomyelitis.  EXAM: LEFT FOOT - 2 VIEW  COMPARISON:  None.  FINDINGS: Plantar calcaneal spur. Diffuse vascular calcifications. No acute bony abnormality. No radiographic changes of osteomyelitis. No fracture, subluxation or dislocation.  IMPRESSION:  No acute bony abnormality.   Electronically Signed   By: Rolm Baptise M.D.   On: 03/16/2014 18:32   Dg Foot 2 Views Right  03/16/2014   CLINICAL DATA:  Soft tissue ulcers.  EXAM: RIGHT FOOT - 2 VIEW  COMPARISON:  None.  FINDINGS: Mild soft tissue swelling is present. No radiopaque foreign bodies. No acute bony or joint  abnormalities. Calcification within the the plantar fascia.Vascular calcification noted. A lucency is noted base of the right third metacarpal. An active process including osteomyelitis cannot be excluded.  IMPRESSION: 1. Focal lucency is seen at the base of the right third metatarsal. Although this may be old an active bone lesion including osteomyelitis cannot be excluded. No acute abnormality otherwise noted.  2. Peripheral vascular calcification indicating peripheral vascular disease.   Electronically Signed   By: Marcello Moores  Register   On: 03/16/2014 18:34   Scheduled: . buPROPion  300 mg Oral Daily  . Chlorhexidine Gluconate Cloth  6 each Topical Q0600  . insulin aspart  0-9 Units Subcutaneous TID WC  . insulin glargine  30 Units Subcutaneous QHS  . metoprolol tartrate  25 mg Oral BID  . mupirocin ointment  1 application Nasal BID  . piperacillin-tazobactam (ZOSYN)  IV  3.375 g Intravenous Q8H  . simvastatin  20 mg Oral q1800  . sodium chloride  3 mL Intravenous Q12H  . [START ON 03/18/2014] vancomycin  2,000 mg Intravenous Q48H  . Warfarin - Pharmacist Dosing Inpatient   Does not apply q1800     LOS: 1 day   Robin [name] C 03/17/2014,10:57 AM

## 2014-03-18 LAB — BASIC METABOLIC PANEL
BUN: 93 mg/dL — ABNORMAL HIGH (ref 6–23)
BUN: 86 mg/dL — ABNORMAL HIGH (ref 6–23)
BUN: 80 mg/dL — AB (ref 6–23)
CHLORIDE: 85 meq/L — AB (ref 96–112)
CHLORIDE: 90 meq/L — AB (ref 96–112)
CO2: 24 meq/L (ref 19–32)
CO2: 23 meq/L (ref 19–32)
CO2: 21 mEq/L (ref 19–32)
CREATININE: 1.58 mg/dL — AB (ref 0.50–1.10)
Calcium: 7.9 mg/dL — ABNORMAL LOW (ref 8.4–10.5)
Calcium: 7.7 mg/dL — ABNORMAL LOW (ref 8.4–10.5)
Calcium: 7.4 mg/dL — ABNORMAL LOW (ref 8.4–10.5)
Chloride: 88 mEq/L — ABNORMAL LOW (ref 96–112)
Creatinine, Ser: 1.55 mg/dL — ABNORMAL HIGH (ref 0.50–1.10)
Creatinine, Ser: 1.56 mg/dL — ABNORMAL HIGH (ref 0.50–1.10)
GFR calc Af Amer: 41 mL/min — ABNORMAL LOW (ref >90)
GFR calc Af Amer: 40 mL/min — ABNORMAL LOW (ref >90)
GFR calc non Af Amer: 34 mL/min — ABNORMAL LOW (ref >90)
GFR calc non Af Amer: 35 mL/min — ABNORMAL LOW (ref >90)
GFR calc non Af Amer: 35 mL/min — ABNORMAL LOW (ref >90)
GFR, EST AFRICAN AMERICAN: 40 mL/min — AB (ref >90)
GLUCOSE: 158 mg/dL — AB (ref 70–99)
GLUCOSE: 172 mg/dL — AB (ref 70–99)
GLUCOSE: 176 mg/dL — AB (ref 70–99)
POTASSIUM: 2.6 meq/L — AB (ref 3.7–5.3)
POTASSIUM: 3 meq/L — AB (ref 3.7–5.3)
Potassium: 3 mEq/L — ABNORMAL LOW (ref 3.7–5.3)
SODIUM: 126 meq/L — AB (ref 137–147)
SODIUM: 125 meq/L — AB (ref 137–147)
Sodium: 126 mEq/L — ABNORMAL LOW (ref 137–147)

## 2014-03-18 LAB — HEMOGLOBIN A1C
Hgb A1c MFr Bld: 7.2 % — ABNORMAL HIGH (ref <5.7)
MEAN PLASMA GLUCOSE: 160 mg/dL — AB (ref <117)

## 2014-03-18 LAB — GLUCOSE, CAPILLARY
GLUCOSE-CAPILLARY: 171 mg/dL — AB (ref 70–99)
GLUCOSE-CAPILLARY: 160 mg/dL — AB (ref 70–99)
Glucose-Capillary: 144 mg/dL — ABNORMAL HIGH (ref 70–99)
Glucose-Capillary: 171 mg/dL — ABNORMAL HIGH (ref 70–99)
Glucose-Capillary: 175 mg/dL — ABNORMAL HIGH (ref 70–99)
Glucose-Capillary: 181 mg/dL — ABNORMAL HIGH (ref 70–99)
Glucose-Capillary: 186 mg/dL — ABNORMAL HIGH (ref 70–99)
Glucose-Capillary: 189 mg/dL — ABNORMAL HIGH (ref 70–99)
Glucose-Capillary: 166 mg/dL — ABNORMAL HIGH (ref 70–99)

## 2014-03-18 LAB — PROTIME-INR
INR: 4.5 — ABNORMAL HIGH (ref 0.00–1.49)
Prothrombin Time: 41 seconds — ABNORMAL HIGH (ref 11.6–15.2)

## 2014-03-18 LAB — IRON AND TIBC
IRON: 17 ug/dL — AB (ref 42–135)
Saturation Ratios: 6 % — ABNORMAL LOW (ref 20–55)
TIBC: 291 ug/dL (ref 250–470)
UIBC: 274 ug/dL (ref 125–400)

## 2014-03-18 LAB — CBC
HCT: 28.2 % — ABNORMAL LOW (ref 36.0–46.0)
HEMOGLOBIN: 8.6 g/dL — AB (ref 12.0–15.0)
MCH: 20.8 pg — ABNORMAL LOW (ref 26.0–34.0)
MCHC: 30.5 g/dL (ref 30.0–36.0)
MCV: 68.1 fL — ABNORMAL LOW (ref 78.0–100.0)
Platelets: ADEQUATE 10*3/uL (ref 150–400)
RBC: 4.14 MIL/uL (ref 3.87–5.11)
RDW: 18.6 % — ABNORMAL HIGH (ref 11.5–15.5)
WBC: 12.4 10*3/uL — AB (ref 4.0–10.5)

## 2014-03-18 LAB — FERRITIN: FERRITIN: 18 ng/mL (ref 10–291)

## 2014-03-18 MED ORDER — METOPROLOL TARTRATE 12.5 MG HALF TABLET
12.5000 mg | ORAL_TABLET | Freq: Two times a day (BID) | ORAL | Status: DC
  Administered 2014-03-18 – 2014-03-22 (×8): 12.5 mg via ORAL
  Filled 2014-03-18 (×10): qty 1

## 2014-03-18 MED ORDER — HYDROCODONE-ACETAMINOPHEN 5-325 MG PO TABS: 1.0000 | ORAL_TABLET | ORAL | Status: DC | PRN

## 2014-03-18 MED ORDER — SODIUM CHLORIDE 0.9 % IV BOLUS (SEPSIS)
500.0000 mL | Freq: Once | INTRAVENOUS | Status: AC
  Administered 2014-03-18: 500 mL via INTRAVENOUS

## 2014-03-18 MED ORDER — VANCOMYCIN HCL 10 G IV SOLR
1500.0000 mg | INTRAVENOUS | Status: DC
  Administered 2014-03-18: 1500 mg via INTRAVENOUS
  Filled 2014-03-18: qty 1500

## 2014-03-18 MED ORDER — POTASSIUM CHLORIDE CRYS ER 20 MEQ PO TBCR
40.0000 meq | EXTENDED_RELEASE_TABLET | Freq: Once | ORAL | Status: AC
  Administered 2014-03-18: 40 meq via ORAL
  Filled 2014-03-18: qty 2

## 2014-03-18 MED ORDER — DOXYCYCLINE HYCLATE 100 MG PO TABS
100.0000 mg | ORAL_TABLET | Freq: Two times a day (BID) | ORAL | Status: DC
  Administered 2014-03-18 – 2014-03-22 (×8): 100 mg via ORAL
  Filled 2014-03-18 (×10): qty 1

## 2014-03-18 MED ORDER — POTASSIUM CHLORIDE CRYS ER 20 MEQ PO TBCR
30.0000 meq | EXTENDED_RELEASE_TABLET | Freq: Two times a day (BID) | ORAL | Status: AC
  Administered 2014-03-18 – 2014-03-19 (×3): 30 meq via ORAL
  Filled 2014-03-18 (×3): qty 1

## 2014-03-18 MED ORDER — SODIUM CHLORIDE 0.9 % IV BOLUS (SEPSIS)
250.0000 mL | Freq: Once | INTRAVENOUS | Status: AC
  Administered 2014-03-18: 250 mL via INTRAVENOUS

## 2014-03-18 NOTE — Progress Notes (Signed)
CRITICAL VALUE ALERT  Critical value received:  K is 2.6  Date of notification:  03/18/14  Time of notification:  1034  Critical value read back:yes  Nurse who received alert:  Nicole CellaStephanie Bridget [place], RN  MD notified (1st page):  Dr Sharon SellerMcClung  Time of first page:  1038  MD notified (2nd page):  Time of second page:  Responding MD:  Dr Sharon SellerMcClung  Time MD responded:  1040

## 2014-03-18 NOTE — Progress Notes (Signed)
Assessment:  1 Severe hyponatremia, perhaps due to excess water consumption on top of AKI and maybe volume depletion; low fractional sodium excretion on admission with low UNa AND low FeNA; continued improvement with current isotonic saline (after initial hypertonic bicarb); continue isotonic sodium chloride for now; this picture is not clear to me but gratifyingly she is improving 2 Hyperkalemia, symptomatic with weakness-resolved and in fact now HYPOkalemic - replace orally KDur 30 BID X 2 days 3 Met acidosis, better - prob due to obstruction - resolved after relief of obstruction and IV sodium bicarbonate  4 Urinary retention with AKI, possibleCKD - no creatinines between 10/2009 (1.02) and admission (2.28) but non-oliguric and renal function continues to improve with bladder drainage (she has been seen in our office in the past and I have requested our records be sent for my review) 5 Anemia - big drop in Hb post IVF hydration; workup per primary service 6 Chronic leg ulcers - vanco and zosyn per primary service 7 AFib on coumadin 8 HTN 9 HLD 10 DM 11 MRSA screen +   Subjective: Interval History:  Urinary retention with 1350cc drained from bladder via urethral cath 6/13 PM Good urine output via foley with 1650 out yesterday Reported to Dr. Florene Glen yesterday that she had overestimated her water consumption PTA She has been seen at [facility] in the past and records have been requested See's Dr. Moise Boring chronically Reports some nausea today,and is presently sitting on the edge of the bed with PT  Objective: Vital signs in last 24 hours: Temp:  [97.7 F (36.5 C)-98.3 F (36.8 C)] 98.2 F (36.8 C) (06/15 0756) Pulse Rate:  [49-80] 61 (06/15 0756) Resp:  [11-30] 11 (06/15 0756) BP: (91-141)/(31-60) 120/34 mmHg (06/15 0756) SpO2:  [83 %-100 %] 96 % (06/15 0756) Weight change:   Intake/Output from previous day: 06/14 0701 - 06/15 0700 In: 1053 [P.O.:600; I.V.:303; IV  Piggyback:150] Out: 9179 [Urine:1650] Intake/Output this shift: Total I/O In: 1710 [P.O.:240; I.V.:1470] Out: -   General appearance: alert and cooperative GI: soft, non-tender; bowel sounds normal; no masses,  no organomegaly and diastasus rectus defect Extremities: chronic changes  Lab Results:  Recent Labs  03/17/14 0342 03/18/14 0440  WBC 13.4* 12.4*  HGB 8.8* 8.6*  HCT 28.4* 28.2*  PLT 419* PLATELET CLUMPS NOTED ON SMEAR, COUNT APPEARS ADEQUATE   Recent Labs  03/17/14 2117 03/18/14 0440  NA 123* 126*  K 3.0* 3.0*  CL 84* 85*  CO2 24 24  GLUCOSE 185* 158*  BUN 95* 93*  CREATININE 1.64* 1.58*  CALCIUM 7.7* 7.9*   Results for KEILYN, HAGGARD (MRN [id number]) as of 03/18/2014 10:03  Ref. Range 03/16/2014 22:37  Osmolality, Ur Latest Range: [id number] mOsm/kg 424  Sodium, Ur No range found <20  Creatinine, Urine No range found 83.08   Calculated fractional sodium excretion: 0.24%  Studies/Results: Dg Chest 1 View  03/16/2014   CLINICAL DATA:  Diabetes.  EXAM: CHEST - 1 VIEW  COMPARISON:  None.  FINDINGS: Mediastinum and hilar structures normal. Mild cardiomegaly with normal pulmonary vascularity. Bibasilar subsegmental atelectasis present. Small bilateral pleural effusions cannot be excluded. No acute bony abnormality .  IMPRESSION: 1. Low lung volumes with mild bibasilar atelectasis. Small pleural effusions cannot be excluded.  2. Mild cardiomegaly, no significant pulmonary venous congestion.   Electronically Signed   By: Marcello Moores  Register   On: 03/16/2014 18:29   US Renal  03/16/2014   CLINICAL DATA:  Acute renal failure.  EXAM:  RENAL/URINARY TRACT ULTRASOUND COMPLETE  COMPARISON:  None.  FINDINGS: Right Kidney:  Length: 13.0 cm. Cortical thinning. Right kidney is slightly echogenic suggesting chronic medical renal disease.  Left Kidney:  Length: 12.1 cm. Cortical thinning. Left kidney is echogenic suggesting chronic medical renal disease.  Bladder:  Bladder is slightly  distended. Patient unable to void. Bladder volume is 814.9 cc.  This was a limited exam due to patient's body habitus and patient's inability to turn.  IMPRESSION: 1. Renal cortical thinning and increased echogenicity suggesting chronic medical renal disease. No hydronephrosis. 2. Mild bladder distention.  Patient unable to void.   Electronically Signed   By: Marcello Moores  Register   On: 03/16/2014 18:10   Dg Foot 2 Views Left  03/16/2014   CLINICAL DATA:  Ulcers.  Rule out osteomyelitis.  EXAM: LEFT FOOT - 2 VIEW  COMPARISON:  None.  FINDINGS: Plantar calcaneal spur. Diffuse vascular calcifications. No acute bony abnormality. No radiographic changes of osteomyelitis. No fracture, subluxation or dislocation.  IMPRESSION: No acute bony abnormality.   Electronically Signed   By: Rolm Baptise M.D.   On: 03/16/2014 18:32   Dg Foot 2 Views Right  03/16/2014   CLINICAL DATA:  Soft tissue ulcers.  EXAM: RIGHT FOOT - 2 VIEW  COMPARISON:  None.  FINDINGS: Mild soft tissue swelling is present. No radiopaque foreign bodies. No acute bony or joint abnormalities. Calcification within the the plantar fascia.Vascular calcification noted. A lucency is noted base of the right third metacarpal. An active process including osteomyelitis cannot be excluded.  IMPRESSION: 1. Focal lucency is seen at the base of the right third metatarsal. Although this may be old an active bone lesion including osteomyelitis cannot be excluded. No acute abnormality otherwise noted.  2. Peripheral vascular calcification indicating peripheral vascular disease.   Electronically Signed   By: Marcello Moores  Register   On: 03/16/2014 18:34   Scheduled: . buPROPion  300 mg Oral Daily  . Chlorhexidine Gluconate Cloth  6 each Topical Q0600  . insulin aspart  0-9 Units Subcutaneous TID WC  . insulin glargine  30 Units Subcutaneous QHS  . metoprolol tartrate  25 mg Oral BID  . mupirocin ointment  1 application Nasal BID  . piperacillin-tazobactam (ZOSYN)  IV   3.375 g Intravenous Q8H  . simvastatin  20 mg Oral q1800  . sodium chloride  3 mL Intravenous Q12H  . vancomycin  1,500 mg Intravenous Q24H  . Warfarin - Pharmacist Dosing Inpatient   Does not apply q1800  Infusions: . sodium chloride 75 mL/hr at 03/18/14 0800     LOS: 2 days  DUNHAM,CYNTHIA B 03/18/2014,10:06 AM

## 2014-03-18 NOTE — Progress Notes (Signed)
Rehab Admissions Coordinator Note:  Patient was screened by Trish MageLogue, Castulo Scarpelli M for appropriateness for an Inpatient Acute Rehab Consult.  At this time, we are recommending Inpatient Rehab consult.  Trish MageLogue, Candra Wegner M 03/18/2014, 9:49 AM  I can be reached at [phone].

## 2014-03-18 NOTE — Progress Notes (Signed)
Physical Therapy Treatment Patient Details Name: Robin BaizeJudith M Barot MRN: [id number] DOB: 08/29/1951 Today's Date: 03/18/2014    History of Present Illness Pt admit with bil LE cellulitis.  Afib.    PT Comments    Patient in chair as PT entered room.  Per chart, patient with K+ of 2.6 today.  Also noted low BP and HR - RN in room.  Performed exercises in chair and instructed on pressure relief for buttocks while in chair.  Agree with need for CIR prior to discharge.  Follow Up Recommendations  CIR;Supervision/Assistance - 24 hour     Equipment Recommendations  Other (comment) (TBD)    Recommendations for Other Services Rehab consult     Precautions / Restrictions Precautions Precautions: Fall Restrictions Weight Bearing Restrictions: No    Mobility  Bed Mobility               General bed mobility comments: Patient in recliner as PT entered room.  BP low and HR fluctuating 53-64.  RN in room and to contact MD.    Transfers                    Ambulation/Gait                 Stairs            Wheelchair Mobility    Modified Rankin (Stroke Patients Only)       Balance                                    Cognition Arousal/Alertness: Awake/alert Behavior During Therapy: Anxious Overall Cognitive Status: Impaired/Different from baseline Area of Impairment: Memory;Attention;Problem solving   Current Attention Level: Sustained Memory: Decreased short-term memory       Problem Solving: Slow processing;Difficulty sequencing;Requires verbal cues General Comments: Difficulty focusing on directions with other noise/activity in room (IV beeping, husband speaking on phone).  Instructed patient on exercises - unable to recall at end of session.  Provided in writing.    Exercises General Exercises - Upper Extremity Shoulder Flexion: AROM;Both;10 reps;Seated;Theraband Theraband Level (Shoulder Flexion): Level 3 (Green) Shoulder  Horizontal ADduction: AROM;Both;10 reps;Seated;Theraband Theraband Level (Shoulder Horizontal Adduction): Level 3 (Green) Elbow Extension: AROM;Both;10 reps;Seated;Theraband Theraband Level (Elbow Extension): Level 3 (Green) General Exercises - Lower Extremity Ankle Circles/Pumps: AROM;Both;10 reps;Seated Quad Sets: AROM;Both;10 reps;Seated Short Arc Quad: AROM;Both;10 reps;Seated Heel Slides: AAROM;Both;10 reps;Seated Hip ABduction/ADduction: AAROM;Both;10 reps;Seated Other Exercises Other Exercises: Pressure relief in sitting.  Instructed patient to perform pressure relief every 15 minutes while in chair.    General Comments        Pertinent Vitals/Pain BP 86/39; HR dropping to 53 during exercises.    Home Living                      Prior Function            PT Goals (current goals can now be found in the care plan section) Progress towards PT goals: Progressing toward goals    Frequency  Min 3X/week    PT Plan Current plan remains appropriate    Co-evaluation             End of Session   Activity Tolerance: Patient limited by fatigue;Treatment limited secondary to medical complications (Comment) (BP and HR low.  K+ at 2.6.) Patient left: in chair;with call bell/phone within reach;with family/visitor present  Time: [id number] PT Time Calculation (min): 40 min  Charges:  $Therapeutic Exercise: 23-37 mins $Therapeutic Activity: 8-22 mins                    G Codes:      Vena AustriaDavis, Marston Mccadden H 03/18/2014, 1:47 PM Durenda HurtSusan H. Renaldo Fiddleravis, PT, [facility] HospitalMBA Acute Rehab Services Pager [phone]

## 2014-03-18 NOTE — Progress Notes (Signed)
ANTICOAGULATION CONSULT NOTE - Follow Up Consult  Pharmacy Consult for Coumadin + Vanco/Zosyn Indication: Afib, Cellulitis  No Known Allergies  Patient Measurements: Height: 5\' 6"  (167.6 cm) Weight: 243 lb 9.7 oz (110.5 kg) IBW/kg (Calculated) : 59.3 Heparin Dosing Weight:   Vital Signs: Temp: 98.2 F (36.8 C) (06/15 0756) Temp src: Oral (06/15 0756) BP: 120/34 mmHg (06/15 0756) Pulse Rate: 61 (06/15 0756)  Labs:  Recent Labs  03/16/14 1123  03/17/14 0342  03/17/14 1551 03/17/14 2117 03/18/14 0440  HGB 11.0*  --  8.8*  --   --   --  8.6*  HCT 34.8*  --  28.4*  --   --   --  28.2*  PLT 563*  --  419*  --   --   --  PLATELET CLUMPS NOTED ON SMEAR, COUNT APPEARS ADEQUATE  LABPROT 24.7*  --  29.5*  --   --   --  41.0*  INR 2.32*  --  2.93*  --   --   --  4.50*  CREATININE 2.28*  < > 1.83*  < > 1.76* 1.64* 1.58*  < > = values in this interval not displayed.  Estimated Creatinine Clearance: 47.1 ml/min (by C-G formula based on Cr of 1.58).  Assessment: 63 yo lady admitted 6/13 with PMH of HTN, HPL, A fib on AC/coumadin, IDDM, PAD, venous insufficiency chronic leg wounds presented with progressive weakness and worsening of leg ulcers to start broad spectrum antibiotics. INR 2.32 on admit. Electrolyte abnormalities K 7.4, SCr 2.28, Na 111, Gluc 217, CO2 15   PMH of HTN, HLD, A fib on AC/coumadin, IDDM, PAD, venous insufficiency, DM  Anticoagulation: Coumadin PTA 5mg  daily, INR today 4.5.  Infectious Disease: WBC 12.4, afebrile, Abx for non-healing LE ulceration and multiple other dermatitis manifestations,wounds., and ulcers  6/13 Vanc>> 6/13 Zosyn>>  6/13 Wound>> 6/13 BC x 2>>  Cardiovascular: hx HN, HLD, afib, CAD - BP 120/34, HR 61. Meds: metoprolol, Zocor  Endocrinology: hx DM, gluc 217 on admission in DKA, CBGs 144-209 on SSI and Lantus  Gastrointestinal / Nutrition: no diet orders  Neurology: wellbutrin  Nephrology: Acute kidney injury likely contributing  to metabolic acidosis. SCr 1.58 down, CrCl 42 ml/min, renal US w/o acute findings. Hyponatremia (up to 126)thought to be d/t excessive water consumption. Patient drinks "one gallon of water per day." K=3 s/p kayexelate  Pulmonary: Room Air  Hematology / Oncology: Hgb 8.6, Plts clumped  PTA Medication Issues: home Novolog prior to meals,   Goal of Therapy:  INR 2-3 Vanco trough 10-15 Monitor platelets by anticoagulation protocol: Yes   Plan:  Continue to hold Coumadin Zosyn 3.375g IV q8hr. Change Vancomycin to 1500mg  IV q24h with improving renal function   Ayat Drenning S. Merilynn Finlandobertson, PharmD, BCPS Clinical Staff Pharmacist Pager [phone]  Misty Stanleyobertson, Dayanira Giovannetti Stillinger 03/18/2014,8:58 AM

## 2014-03-18 NOTE — Evaluation (Signed)
Occupational Therapy Evaluation Patient Details Name: Robin [name] MRN: [id number] DOB: Aug 22, 1951 Today's Date: 03/18/2014    History of Present Illness Pt admit with bil LE cellulitis.  Afib.   Clinical Impression   PT admitted with BIL LE cellulitis and AFIB. Pt currently with functional limitiations due to the deficits listed below (see OT problem list). PTA progressive decline in independence over weeks time. Pt will benefit from skilled OT to increase their independence and safety with adls and balance to allow discharge CIR. OT to follow acutely for adl retraining and balance.     Follow Up Recommendations  CIR    Equipment Recommendations  3 in 1 bedside comode;Hospital bed    Recommendations for Other Services       Precautions / Restrictions Precautions Precautions: Fall Restrictions Weight Bearing Restrictions: No      Mobility Bed Mobility Overal bed mobility: Needs Assistance;+2 for physical assistance Bed Mobility: Supine to Sit;Rolling Rolling: Max assist   Supine to sit: +2 for physical assistance;Max assist;HOB elevated     General bed mobility comments: pt log rolled to the Rt side due to c/o back pain. pt educated on back precaution to help with back pain during bed mobilty. pt educated on the need for frequent position changes to decr presssure wounds.   Transfers Overall transfer level: Needs assistance Equipment used: 2 person hand held assist Transfers: Sit to/from [employer] Sit to Stand: Max assist;+2 physical assistance Stand pivot transfers: Mod assist;+2 physical assistance       General transfer comment: Pt needed verbalize education to help with anxiety prior to standing. pt very anxious to move with x2 females adn not males. pt once standing able to side step to 3n1 and then chair.     Balance Overall balance assessment: Needs assistance Sitting-balance support: Bilateral upper extremity supported;Feet  supported Sitting balance-Leahy Scale: Poor Sitting balance - Comments: Pt initially at eob with posterior lean unable to maintain bil LE on floor and grabbing at any environmental items possible. Pt required extended time and max cueing to progress to static sitting min guard (A) Postural control: Posterior lean Standing balance support: Bilateral upper extremity supported;During functional activity Standing balance-Leahy Scale: Poor                              ADL Overall ADL's : Needs assistance/impaired Eating/Feeding: Set up;Sitting Eating/Feeding Details (indicate cue type and reason): Pt with poor awareness to PO intake with current medical state. Pt asking spouse to go to the [facility] cafe to obtain items not on diet plan. pt requesting incr liquid intake. pt educated during session again of fluid restrictions. pt states "but the doctor says I am getting better so I should be able to have more" Grooming: Wash/dry face;Set up;Sitting                   Toilet Transfer: +2 for physical assistance;Moderate assistance;BSC Toilet Transfer Details (indicate cue type and reason): required max cueing and very anxious initially with attempt. pt is able to anterior weight shift and step to commode with cueing Toileting- Clothing Manipulation and Hygiene: Total assistance         General ADL Comments: Pt noted to have decr BP on arrival with Map 55. Pt assisted to EOB with no compliants. Pt with incr Map . pt transfered to chair with MAP 72 . pt no complaints and reports "this feels so good  thank you" Pt tearful in chair and tells therapist how pastor prayed for her this morning and now she is able to transfer to the chair. pt very happy with progress.      Vision                     Perception     Praxis      Pertinent Vitals/Pain MAP 55 on arrival MAP 72 in chair       Hand Dominance Right   Extremity/Trunk Assessment Upper Extremity Assessment Upper  Extremity Assessment: Generalized weakness   Lower Extremity Assessment Lower Extremity Assessment: Defer to PT evaluation   Cervical / Trunk Assessment Cervical / Trunk Assessment: Kyphotic   Communication Communication Communication: No difficulties   Cognition Arousal/Alertness: Awake/alert Behavior During Therapy: Anxious Overall Cognitive Status: Impaired/Different from baseline Area of Impairment: Orientation;Attention;Memory;Following commands;Safety/judgement;Problem solving;Awareness Orientation Level: Disoriented to;Situation (reports fireman dropped her) Current Attention Level: Sustained Memory: Decreased short-term memory Following Commands: Follows multi-step commands with increased time Safety/Judgement: Decreased awareness of safety;Decreased awareness of deficits Awareness: Emergent Problem Solving: Slow processing;Decreased initiation;Difficulty sequencing General Comments: pt reports fireman dropping patient during transfer. Spouse states "why did she say that? thats not true." pt asked to recount events PTA and never mentioned falling with fireman assistance. OT cueing patient to this fact and pt states "well I remember falling with them" Pt asking if RN phone in hall was personal cell phone ringing. Spouse reports your phone is not here. pt denies this fact and insist phone is present.    General Comments       Exercises Exercises: General Upper Extremity;General Lower Extremity Other Exercises Other Exercises: Pressure relief in sitting.  Instructed patient to perform pressure relief every 15 minutes while in chair.   Shoulder Instructions      Home Living Family/patient expects to be discharged to:: Private residence Living Arrangements: Spouse/significant other;Children Available Help at Discharge: Family;Available PRN/intermittently Type of Home: House Home Access: Stairs to enter Entergy CorporationEntrance Stairs-Number of Steps: 3 Entrance Stairs-Rails: Can reach  both Home Layout: Two level;Able to live on main level with bedroom/bathroom     Bathroom Shower/Tub: Other (comment) (sponge bath currently due to wounds)   Bathroom Toilet: Standard (with riser)     Home Equipment: Cane - single point;Wheelchair - manual;Toilet riser   Additional Comments: using w/c increasingly more x3 days prior to admission. 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 [facility] is currently at [facility] during [facility] day for day program.       Prior Functioning/Environment Level of Independence: Independent with assistive device(s);Needs assistance  Gait / Transfers Assistance Needed: used cane up until a week ago ADL's / Homemaking Assistance Needed: sponge bath required, previous to BIL LE wounds showered independently. Dressing from recliner and husband completes LB (A)        OT Diagnosis: Cognitive deficits;Generalized weakness;Acute pain   OT Problem List: Decreased strength;Decreased activity tolerance;Impaired balance (sitting and/or standing);Decreased cognition;Decreased safety awareness;Decreased knowledge of use of DME or AE;Decreased knowledge of precautions;Cardiopulmonary status limiting activity;Obesity;Pain;Other (comment)   OT Treatment/Interventions: Self-care/ADL training;Therapeutic exercise;DME and/or AE instruction;Therapeutic activities;Cognitive remediation/compensation;Patient/family education;Balance training    OT Goals(Current goals can be found in the care plan section) Acute Rehab OT Goals Patient Stated Goal: to get back to walking OT Goal Formulation: With patient/family Time For Goal Achievement: 04/01/14 Potential to Achieve Goals: Good  OT Frequency: Min 2X/week   Barriers to D/C: Decreased caregiver  support  husband is caregiver to 62 yo son with disabilities       Co-evaluation              End of Session Nurse Communication: Mobility status;Precautions  Activity Tolerance:  Patient tolerated treatment well Patient left: in chair;with call bell/phone within reach;with family/visitor present   Time: 1020-1108 OT Time Calculation (min): 48 min Charges:  OT General Charges $OT Visit: 1 Procedure OT Evaluation $Initial OT Evaluation Tier I: 1 Procedure OT Treatments $Self Care/Home Management : 38-52 mins G-Codes:    Harolyn RutherfordJones, Azaria Bartell B 03/18/2014, 2:02 PM Pager: [phone]

## 2014-03-18 NOTE — Progress Notes (Signed)
Critical lab value called from lab.  K is 2.6.  MD paged and notified.  Will continue to monitor.

## 2014-03-18 NOTE — Progress Notes (Signed)
[place] TEAM 1 - Stepdown/ICU TEAM Progress Note  Robin BaizeJudith M [name] ZOX:[id number] DOB: 02/21/1951 DOA: 03/16/2014 PCP: Robin [name]  Admit HPI / Brief Narrative: 63 y.o. female with PMH of HTN, HLD, A fib on coumadin, IDDM, PAD, and venous insufficiency w/ chronic leg wounds who presented with progressive weakness, and worsening of leg ulcers.  In the ED she was found to have a K+ of 7.2, and a Na+ of 111.  She was given NS, IV insulin + d50, and kayexalate.  She denied focal neurological symptoms, but was fatigued with generalized weakness; no chest pain, no SOB, no cough, no nausea, vomiting or diarrhea.  Records indicate that over 1200cc of urine was obtained when a foley catheter was placed.  The pt was not aware of urinary retention sx.    HPI/Subjective: Pt is quite talkative.  She denies any new problems.  She states she feel weak in general, and is worried about being able to care for herself at home.  Denies cp, sob, n/v, or abdom pain.    Assessment/Plan:  Severe hyponatremia Na improving steadily, now on NS - ?simply due to AKI in setting of urinary retention - cont current interventions and follow in as serial fashion  Severe hyperkalemia >> severe hypokalemia  K+ 7.2 at admit - normalized and now hypokalemic - bicarb gtt stopped - replete and follow   Metabolic acidosis  due to renal failure - improved w/ bicarb gtt - now off gtt and holding steady - follow   Acute kidney injury  BUN of 139, creat of 2.28 at presentation - crt was 1.02 in 2011 - etiology unclear - Nephrology following - renal US w/o acute findings   Sepsis - B leg ulcers Cont empiric abx - sepsis physiology improving - I am not conviced this contributed significantly to her admit - will transition to oral abx and follow clinically - pt is afebrile  DM Follow CBG closely - A1c 7.2  Hypotension w/ Hx of HTN Suspect element of post-obstructive auto-diuresis - pt remained in negative balance until  today - hydrate and follow - follow Is/Os  Microcytic anemia Hgb has dropped signif w/ hydration - no evidence of blood loss - guaiac stools - check anemia panel   HLD  Chronic Afib on coumadin Rate well controlled   MRSA screen +  Code Status: FULL Family Communication: no family present at time of exam Disposition Plan: SDU  Consultants: Nephrology  Procedures: none  Antibiotics: Vanc 6/13 >>6/15 Zosyn 6/13 >>6/15 Doxycycline 6/15 >>  DVT prophylaxis: warfarin  Objective: Blood pressure 86/39, pulse 53, temperature 98.4 F (36.9 C), temperature source Oral, resp. rate 21, height 5\' 6"  (1.676 m), weight 110.5 kg (243 lb 9.7 oz), SpO2 98.00%.  Intake/Output Summary (Last 24 hours) at 03/18/14 1612 Last data filed at 03/18/14 1509  Gross per 24 hour  Intake   2584 ml  Output   1550 ml  Net   1034 ml   Exam: General: No acute respiratory distress - alert and oriented x4 Lungs: Clear to auscultation bilaterally without wheezes or crackles - bs distant  Cardiovascular: Regular rate with irreg rhythm - HS distant - no appreciable M  Abdomen: obese, soft, apparent abdom wall herniation in LLQ w/o tenderness on exam, bs+, no rebound  Extremities: multiple wounds B LE - no signif edema   Data Reviewed: Basic Metabolic Panel:  Recent Labs Lab 03/17/14 0958 03/17/14 1551 03/17/14 2117 03/18/14 0440 03/18/14 0936  NA 124*  124* 123* 126* 126*  K 4.0 3.5* 3.0* 3.0* 2.6*  CL 86* 84* 84* 85* 88*  CO2 21 24 24 24 23   GLUCOSE 185* 201* 185* 158* 172*  BUN 108* 103* 95* 93* 86*  CREATININE 1.75* 1.76* 1.64* 1.58* 1.55*  CALCIUM 8.2* 7.9* 7.7* 7.9* 7.7*   Liver Function Tests: No results found for this basename: AST, ALT, ALKPHOS, BILITOT, PROT, ALBUMIN,  in the last 168 hours  CBC:  Recent Labs Lab 03/16/14 1123 03/17/14 0342 03/18/14 0440  WBC 21.6* 13.4* 12.4*  NEUTROABS 20.2*  --   --   HGB 11.0* 8.8* 8.6*  HCT 34.8* 28.4* 28.2*  MCV 65.8* 67.5* 68.1*   PLT 563* 419* PLATELET CLUMPS NOTED ON SMEAR, COUNT APPEARS ADEQUATE   CBG:  Recent Labs Lab 03/17/14 1700 03/17/14 2036 03/18/14 0113 03/18/14 0755 03/18/14 1132  GLUCAP 171* 186* 144* 171* 171*    Recent Results (from the past 240 hour(s))  CULTURE, BLOOD (ROUTINE X 2)     Status: None   Collection Time    03/16/14  1:35 PM      Result Value Ref Range Status   Specimen Description BLOOD LEFT HAND   Final   Special Requests BOTTLES DRAWN AEROBIC AND ANAEROBIC 10CC   Final   Culture  Setup Time     Final   Value: 03/16/2014 18:20     Performed at [facility]   Culture     Final   Value:        BLOOD CULTURE RECEIVED NO GROWTH TO DATE CULTURE WILL BE HELD FOR 5 DAYS BEFORE ISSUING A FINAL NEGATIVE REPORT     Performed at [facility]   Report Status PENDING   Incomplete  CULTURE, BLOOD (ROUTINE X 2)     Status: None   Collection Time    03/16/14  1:45 PM      Result Value Ref Range Status   Specimen Description BLOOD LEFT ARM   Final   Special Requests BOTTLES DRAWN AEROBIC AND ANAEROBIC 10CC   Final   Culture  Setup Time     Final   Value: 03/16/2014 18:20     Performed at [facility]   Culture     Final   Value:        BLOOD CULTURE RECEIVED NO GROWTH TO DATE CULTURE WILL BE HELD FOR 5 DAYS BEFORE ISSUING A FINAL NEGATIVE REPORT     Performed at [facility]   Report Status PENDING   Incomplete  MRSA PCR SCREENING     Status: Abnormal   Collection Time    03/16/14  3:20 PM      Result Value Ref Range Status   MRSA by PCR POSITIVE (*) NEGATIVE Final   Comment:            The GeneXpert MRSA Assay (FDA     approved for NASAL specimens     only), is one component of a     comprehensive MRSA colonization     surveillance program. It is not     intended to diagnose MRSA     infection nor to guide or     monitor treatment for     MRSA infections.     RESULT CALLED TO, READ BACK BY AND VERIFIED WITH:     A.AFACSAWO,RN 1719 03/16/14  M.CAMPBELL  WOUND CULTURE     Status: None   Collection Time    03/16/14  3:48  PM      Result Value Ref Range Status   Specimen Description WOUND   Final   Special Requests RIGHT LEG   Final   Gram Stain     Final   Value: RARE WCP RARE SQUAMOUS EPITHELIAL CELLS PRESENT     NO ORGANISMS SEEN     Performed at [facility]   Culture     Final   Value: Culture reincubated for better growth     Performed at [facility]   Report Status PENDING   Incomplete  WOUND CULTURE     Status: None   Collection Time    03/16/14  5:08 PM      Result Value Ref Range Status   Specimen Description WOUND   Final   Special Requests LEFT LEG   Final   Gram Stain     Final   Value: FEW WBC PRESENT, PREDOMINANTLY PMN     RARE SQUAMOUS EPITHELIAL CELLS PRESENT     FEW GRAM POSITIVE COCCI IN PAIRS     FEW GRAM NEGATIVE COCCI     RARE GRAM NEGATIVE RODS   Culture     Final   Value: Culture reincubated for better growth     Performed at [facility]   Report Status PENDING   Incomplete     Studies:  Recent x-ray studies have been reviewed in detail by the Attending Physician  Scheduled Meds:  Scheduled Meds: . buPROPion  300 mg Oral Daily  . Chlorhexidine Gluconate Cloth  6 each Topical Q0600  . insulin aspart  0-9 Units Subcutaneous TID WC  . insulin glargine  30 Units Subcutaneous QHS  . metoprolol tartrate  25 mg Oral BID  . mupirocin ointment  1 application Nasal BID  . piperacillin-tazobactam (ZOSYN)  IV  3.375 g Intravenous Q8H  . potassium chloride  30 mEq Oral BID  . simvastatin  20 mg Oral q1800  . sodium chloride  3 mL Intravenous Q12H  . vancomycin  1,500 mg Intravenous Q24H  . Warfarin - Pharmacist Dosing Inpatient   Does not apply q1800    Time spent on care of this patient: 35 mins   [facility] T , [name]   [facility] Office  [phone] Pager - Text Page per Amion as per below:  On-Call/Text Page:      [email or web address]      password  [email or web address]  If 7PM-7AM, please contact night-coverage [email or web address] Password [email or web address] 03/18/2014, 4:12 PM   LOS: 2 days

## 2014-03-19 DIAGNOSIS — N179 Acute kidney failure, unspecified: Secondary | ICD-10-CM

## 2014-03-19 DIAGNOSIS — R5381 Other malaise: Secondary | ICD-10-CM

## 2014-03-19 LAB — CBC
HEMATOCRIT: 26.4 % — AB (ref 36.0–46.0)
HEMOGLOBIN: 7.9 g/dL — AB (ref 12.0–15.0)
MCH: 20.5 pg — AB (ref 26.0–34.0)
MCHC: 29.9 g/dL — ABNORMAL LOW (ref 30.0–36.0)
MCV: 68.4 fL — ABNORMAL LOW (ref 78.0–100.0)
Platelets: 365 10*3/uL (ref 150–400)
RBC: 3.86 MIL/uL — AB (ref 3.87–5.11)
RDW: 18.9 % — ABNORMAL HIGH (ref 11.5–15.5)
WBC: 12.1 10*3/uL — ABNORMAL HIGH (ref 4.0–10.5)

## 2014-03-19 LAB — BASIC METABOLIC PANEL
BUN: 78 mg/dL — AB (ref 6–23)
BUN: 77 mg/dL — ABNORMAL HIGH (ref 6–23)
CHLORIDE: 87 meq/L — AB (ref 96–112)
CHLORIDE: 90 meq/L — AB (ref 96–112)
CO2: 19 meq/L (ref 19–32)
CO2: 19 mEq/L (ref 19–32)
CREATININE: 1.61 mg/dL — AB (ref 0.50–1.10)
Calcium: 7.7 mg/dL — ABNORMAL LOW (ref 8.4–10.5)
Calcium: 8 mg/dL — ABNORMAL LOW (ref 8.4–10.5)
Creatinine, Ser: 1.71 mg/dL — ABNORMAL HIGH (ref 0.50–1.10)
GFR calc Af Amer: 36 mL/min — ABNORMAL LOW (ref >90)
GFR calc non Af Amer: 33 mL/min — ABNORMAL LOW (ref >90)
GFR, EST AFRICAN AMERICAN: 39 mL/min — AB (ref >90)
GFR, EST NON AFRICAN AMERICAN: 31 mL/min — AB (ref >90)
GLUCOSE: 145 mg/dL — AB (ref 70–99)
GLUCOSE: 241 mg/dL — AB (ref 70–99)
POTASSIUM: 3.4 meq/L — AB (ref 3.7–5.3)
POTASSIUM: 3.3 meq/L — AB (ref 3.7–5.3)
SODIUM: 125 meq/L — AB (ref 137–147)
Sodium: 123 mEq/L — ABNORMAL LOW (ref 137–147)

## 2014-03-19 LAB — VITAMIN B12: VITAMIN B 12: 617 pg/mL (ref 211–911)

## 2014-03-19 LAB — GLUCOSE, CAPILLARY
Glucose-Capillary: 141 mg/dL — ABNORMAL HIGH (ref 70–99)
Glucose-Capillary: 181 mg/dL — ABNORMAL HIGH (ref 70–99)
Glucose-Capillary: 238 mg/dL — ABNORMAL HIGH (ref 70–99)
Glucose-Capillary: 174 mg/dL — ABNORMAL HIGH (ref 70–99)

## 2014-03-19 LAB — FERRITIN: Ferritin: 17 ng/mL (ref 10–291)

## 2014-03-19 LAB — IRON AND TIBC: UIBC: 275 ug/dL (ref 125–400)

## 2014-03-19 LAB — RETICULOCYTES
RBC.: 3.86 MIL/uL — ABNORMAL LOW (ref 3.87–5.11)
Retic Count, Absolute: 166 10*3/uL (ref 19.0–186.0)
Retic Ct Pct: 4.3 % — ABNORMAL HIGH (ref 0.4–3.1)

## 2014-03-19 LAB — OCCULT BLOOD X 1 CARD TO LAB, STOOL: FECAL OCCULT BLD: POSITIVE — AB

## 2014-03-19 LAB — PROTIME-INR
INR: 4.95 — ABNORMAL HIGH (ref 0.00–1.49)
Prothrombin Time: 44.1 seconds — ABNORMAL HIGH (ref 11.6–15.2)

## 2014-03-19 LAB — FOLATE: FOLATE: 6.7 ng/mL

## 2014-03-19 MED ORDER — SODIUM BICARBONATE 650 MG PO TABS
650.0000 mg | ORAL_TABLET | Freq: Three times a day (TID) | ORAL | Status: DC
  Administered 2014-03-19 – 2014-03-22 (×11): 650 mg via ORAL
  Filled 2014-03-19 (×13): qty 1

## 2014-03-19 MED ORDER — SODIUM CHLORIDE 0.9 % IV SOLN
510.0000 mg | INTRAVENOUS | Status: AC
  Administered 2014-03-19 – 2014-03-22 (×2): 510 mg via INTRAVENOUS
  Filled 2014-03-19 (×3): qty 17

## 2014-03-19 MED ORDER — FUROSEMIDE 40 MG PO TABS
40.0000 mg | ORAL_TABLET | Freq: Every day | ORAL | Status: DC
  Administered 2014-03-19 – 2014-03-22 (×4): 40 mg via ORAL
  Filled 2014-03-19 (×4): qty 1

## 2014-03-19 NOTE — Progress Notes (Signed)
[place] TEAM 1 - Stepdown/ICU TEAM Progress Note  Jewel BaizeJudith M Jamar ZOX:[id number] DOB: 01/06/1951 DOA: 03/16/2014 PCP: Cala BradfordWHITE,[name], MD  Admit HPI / Brief Narrative: 63 y.o. female with PMH of HTN, HLD, A fib on coumadin, IDDM, PAD, and venous insufficiency w/ chronic leg wounds who presented with progressive weakness, and worsening of leg ulcers.  In the ED she was found to have a K+ of 7.2, and a Na+ of 111.  She was given NS, IV insulin + d50, and kayexalate.  She denied focal neurological symptoms, but was fatigued with generalized weakness; no chest pain, no SOB, no cough, no nausea, vomiting or diarrhea.  Records indicate that over 1200cc of urine was obtained when a foley catheter was placed.  The pt was not aware of urinary retention sx.    HPI/Subjective: No new complaints today.  Feeling of generalize weakness persists.  Of note, Na has decreased since yesterday.    Assessment/Plan:  Severe hyponatremia Na improved steadily initially w/ hypertonic IV bicarb - on transitioning to NS w/ multiple boluses over last 24hrs, Na has begun to drop again - resume home lasix to encourage free water loss - Nephrology notes reviewed - bicarb to be initiated - will stop IV NS  Severe hyperkalemia >> severe hypokalemia  K+ 7.2 at admit - normalized and now hypokalemic - bicarb gtt stopped - replete and follow   Metabolic acidosis  due to renal failure - improved w/ bicarb gtt - now off gtt and slowly declining - follow w/ addition of oral bicarb per Nephrology   Acute kidney injury  BUN of 139, creat of 2.28 at presentation - crt was 1.02 in 2011 - etiology unclear - Nephrology following - renal US w/o acute findings - per Nephrology office note review, pt is likely now at her recent estimated baseline   Sepsis - B leg ulcers Cont empiric abx - sepsis physiology improving - I am not conviced this contributed significantly to her admit - transitioned to oral abx - follow clinically - pt is  afebrile  DM CBG reasonably controlled - A1c 7.2   Hypotension w/ Hx of HTN Clinically pt no longer appears to be volume depleted   Microcytic anemia Hgb has dropped signif w/ hydration - no evidence of blood loss - guaiac stools pending - Fe level severely low - pt remembers today she had prior pan-endospcopy per Dr. Ewing SchleinMagod and was told to take Fe then - Nephrology is loading her w/ IV Fe - will attempt to locate prior GI records   HLD  Chronic Afib on coumadin Rate well controlled   MRSA screen +  Code Status: FULL Family Communication: no family present at time of exam Disposition Plan: SDU until lytes more stable   Consultants: Nephrology  Procedures: none  Antibiotics: Vanc 6/13 >>6/15 Zosyn 6/13 >>6/15 Doxycycline 6/15 >>  DVT prophylaxis: warfarin  Objective: Blood pressure 99/48, pulse 72, temperature 98.7 F (37.1 C), temperature source Oral, resp. rate 20, height 5\' 6"  (1.676 m), weight 110 kg (242 lb 8.1 oz), SpO2 99.00%.  Intake/Output Summary (Last 24 hours) at 03/19/14 1312 Last data filed at 03/19/14 1038  Gross per 24 hour  Intake 1108.5 ml  Output    850 ml  Net  258.5 ml   Exam: General: No acute respiratory distress - alert and oriented x4 Lungs: Clear to auscultation bilaterally without wheezes or crackles - bs distant  Cardiovascular: Regular rate with irreg rhythm - HS distant - no  M  Abdomen: obese, soft, apparent abdom wall herniation in LLQ w/o tenderness on exam, bs+, no rebound  Extremities: multiple wounds B LE - no signif edema   Data Reviewed: Basic Metabolic Panel:  Recent Labs Lab 03/17/14 2117 03/18/14 0440 03/18/14 0936 03/18/14 1600 03/19/14 0305  NA 123* 126* 126* 125* 123*  K 3.0* 3.0* 2.6* 3.0* 3.4*  CL 84* 85* 88* 90* 87*  CO2 24 24 23 21 19   GLUCOSE 185* 158* 172* 176* 145*  BUN 95* 93* 86* 80* 78*  CREATININE 1.64* 1.58* 1.55* 1.56* 1.61*  CALCIUM 7.7* 7.9* 7.7* 7.4* 7.7*   Liver Function Tests: No  results found for this basename: AST, ALT, ALKPHOS, BILITOT, PROT, ALBUMIN,  in the last 168 hours  CBC:  Recent Labs Lab 03/16/14 1123 03/17/14 0342 03/18/14 0440 03/19/14 0305  WBC 21.6* 13.4* 12.4* 12.1*  NEUTROABS 20.2*  --   --   --   HGB 11.0* 8.8* 8.6* 7.9*  HCT 34.8* 28.4* 28.2* 26.4*  MCV 65.8* 67.5* 68.1* 68.4*  PLT 563* 419* PLATELET CLUMPS NOTED ON SMEAR, COUNT APPEARS ADEQUATE 365   CBG:  Recent Labs Lab 03/18/14 0755 03/18/14 1132 03/18/14 1715 03/18/14 2146 03/19/14 0734  GLUCAP 171* 171* 160* 166* 141*    Recent Results (from the past 240 hour(s))  CULTURE, BLOOD (ROUTINE X 2)     Status: None   Collection Time    03/16/14  1:35 PM      Result Value Ref Range Status   Specimen Description BLOOD LEFT HAND   Final   Special Requests BOTTLES DRAWN AEROBIC AND ANAEROBIC 10CC   Final   Culture  Setup Time     Final   Value: 03/16/2014 18:20     Performed at [facility]   Culture     Final   Value:        BLOOD CULTURE RECEIVED NO GROWTH TO DATE CULTURE WILL BE HELD FOR 5 DAYS BEFORE ISSUING A FINAL NEGATIVE REPORT     Performed at [facility]   Report Status PENDING   Incomplete  CULTURE, BLOOD (ROUTINE X 2)     Status: None   Collection Time    03/16/14  1:45 PM      Result Value Ref Range Status   Specimen Description BLOOD LEFT ARM   Final   Special Requests BOTTLES DRAWN AEROBIC AND ANAEROBIC 10CC   Final   Culture  Setup Time     Final   Value: 03/16/2014 18:20     Performed at [facility]   Culture     Final   Value:        BLOOD CULTURE RECEIVED NO GROWTH TO DATE CULTURE WILL BE HELD FOR 5 DAYS BEFORE ISSUING A FINAL NEGATIVE REPORT     Performed at [facility]   Report Status PENDING   Incomplete  MRSA PCR SCREENING     Status: Abnormal   Collection Time    03/16/14  3:20 PM      Result Value Ref Range Status   MRSA by PCR POSITIVE (*) NEGATIVE Final   Comment:            The GeneXpert MRSA Assay (FDA      approved for NASAL specimens     only), is one component of a     comprehensive MRSA colonization     surveillance program. It is not     intended to diagnose MRSA  infection nor to guide or     monitor treatment for     MRSA infections.     RESULT CALLED TO, READ BACK BY AND VERIFIED WITH:     A.AFACSAWO,RN 1719 03/16/14 M.CAMPBELL  WOUND CULTURE     Status: None   Collection Time    03/16/14  3:48 PM      Result Value Ref Range Status   Specimen Description WOUND   Final   Special Requests RIGHT LEG   Final   Gram Stain     Final   Value: RARE WCP RARE SQUAMOUS EPITHELIAL CELLS PRESENT     NO ORGANISMS SEEN     Performed at [facility]   Culture     Final   Value: Culture reincubated for better growth     Performed at [facility]   Report Status PENDING   Incomplete  WOUND CULTURE     Status: None   Collection Time    03/16/14  5:08 PM      Result Value Ref Range Status   Specimen Description WOUND   Final   Special Requests LEFT LEG   Final   Gram Stain     Final   Value: FEW WBC PRESENT, PREDOMINANTLY PMN     RARE SQUAMOUS EPITHELIAL CELLS PRESENT     FEW GRAM POSITIVE COCCI IN PAIRS     FEW GRAM NEGATIVE COCCI     RARE GRAM NEGATIVE RODS   Culture     Final   Value: MODERATE STAPHYLOCOCCUS AUREUS     Note: RIFAMPIN AND GENTAMICIN SHOULD NOT BE USED AS SINGLE DRUGS FOR TREATMENT OF STAPH INFECTIONS.     Performed at [facility]   Report Status PENDING   Incomplete     Studies:  Recent x-ray studies have been reviewed in detail by the Attending Physician  Scheduled Meds:  Scheduled Meds: . buPROPion  300 mg Oral Daily  . Chlorhexidine Gluconate Cloth  6 each Topical Q0600  . doxycycline  100 mg Oral Q12H  . ferumoxytol  510 mg Intravenous Q3 days  . insulin aspart  0-9 Units Subcutaneous TID WC  . insulin glargine  30 Units Subcutaneous QHS  . metoprolol tartrate  12.5 mg Oral BID  . mupirocin ointment  1 application Nasal  BID  . potassium chloride  30 mEq Oral BID  . simvastatin  20 mg Oral q1800  . sodium bicarbonate  650 mg Oral TID  . sodium chloride  3 mL Intravenous Q12H  . Warfarin - Pharmacist Dosing Inpatient   Does not apply q1800    Time spent on care of this patient: 35 mins   [facility] T , MD   [facility] Office  [phone] Pager - Text Page per Amion as per below:  On-Call/Text Page:      [email or web address]      password [email or web address]  If 7PM-7AM, please contact night-coverage [email or web address] Password [email or web address] 03/19/2014, 1:12 PM   LOS: 3 days

## 2014-03-19 NOTE — Progress Notes (Signed)
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  Will start oral sodium bicarbonate to try to maintain CO2 20-22.  This of course does not fully explain her hyponatremia, but again in the past she has had a tendency to drink a fair amount of water.  She is maintained chronically on lasix at home - not on now. Continue 1000 cc water restriction. (pt aware) 2 Hyperkalemia, symptomatic with weakness-resolved and in fact now HYPOkalemic - replacing orally KDur 30 BID X 2 days (last dose will be tonight) 3 Met acidosis, better - initially thought due to obstruction which may have played some role, but runs low normal CO2's at baseline (20 in April 2014) - resolved after relief of obstruction and IV sodium bicarbonate; drifting back down. Adding po bicarb as noted above.  4 Urinary retention with AKI on CKD - improved from admission value of 2.28  (creatinine 01/2013 1.32 and in 11/2012 was 1.45).  Current creatinine may actually be close to her baseline (will check with Dr. Orest Dikes office for more recent) 5 Anemia - big drop in Hb post IVF hydration; very iron deficient; dose with Feraheme X 2 and check stools.Hb has dropped 11->7.9 since adm 6 Chronic leg ulcers - vanco and zosyn per primary service 7 AFib on coumadin - per pharm; INR supratherapeutic 8 HTN 9 HLD 10 DM 11 MRSA screen +  Subjective: Interval History:  Urinary retention with 1350cc  drained from bladder via urethral cath 6/13 PM UOP recorded at 600 yesterday Running some soft blood pressures Felt dizzy yesterday, hasn't been up today Says when they run low at home she holds her meds Objective: Vital signs in last 24 hours: Temp:  [98 F (36.7 C)-98.7 F (37.1 C)] 98.7 F (37.1 C) (06/16 0800) Pulse Rate:  [45-73] 72 (06/16 0918) Resp:  [16-25] 20 (06/16 0800) BP: (80-140)/(38-55) 99/48 mmHg (06/16 0918) SpO2:  [95 %-99 %] 99 % (06/16 0800) Weight:  [110 kg (242 lb 8.1 oz)] 110 kg (242 lb 8.1 oz) (06/16 0409) Weight change:   Intake/Output from previous day: 06/15 0701 - 06/16 0700 In: 3616.5 [P.O.:1140; I.V.:2476.5] Out: 600 [Urine:600] Intake/Output this shift: Total I/O In: 3 [I.V.:3] Out: -  PHYSICAL EXAMINATION General appearance: alert and cooperative VS as noted Lungs clear Heart AFF rate 70's GI: soft, non-tender; bowel sounds normal; not tender Extremities: chronic changes LE's with edema; wrapped in Kerlix (she says "better than usual" Foley clear urine   Recent Labs  03/18/14 0440 03/19/14 0305  WBC 12.4* 12.1*  HGB 8.6* 7.9*  HCT 28.2* 26.4*  PLT PLATELET CLUMPS NOTED ON SMEAR, COUNT APPEARS ADEQUATE 365    Recent Labs  03/18/14 1600 03/19/14 0305  NA 125* 123*  K 3.0* 3.4*  CL 90* 87*  CO2 21 19  GLUCOSE 176* 145*  BUN 80* 78*  CREATININE 1.56* 1.61*  CALCIUM 7.4* 7.7*   Results for [name], [name] (MRN [id number]) as of 03/18/2014 10:03  Ref. Range 03/16/2014 22:37  Osmolality, Ur Latest Range: 390-1090 mOsm/kg 424  Sodium, Ur No range found <20  Creatinine, Urine No range found 83.08   Calculated fractional sodium excretion: 0.24% on admission  Scheduled: . buPROPion  300 mg Oral Daily  . Chlorhexidine Gluconate Cloth  6 each Topical Q0600  . doxycycline  100 mg Oral Q12H  . ferumoxytol  510 mg Intravenous Q3 days  . insulin aspart  0-9 Units Subcutaneous TID WC  . insulin glargine  30 Units Subcutaneous QHS   . metoprolol tartrate  12.5 mg Oral BID  . mupirocin ointment  1 application Nasal BID  . potassium chloride  30 mEq Oral BID  . simvastatin  20 mg Oral q1800  . sodium bicarbonate  650 mg Oral TID  . sodium chloride  3 mL Intravenous Q12H  . Warfarin - Pharmacist Dosing Inpatient   Does not apply q1800  Infusions: . sodium chloride Stopped (03/18/14 1824)     LOS: 3 days  DUNHAM,CYNTHIA B 03/19/2014,10:22 AM    

## 2014-03-19 NOTE — PMR Pre-admission (Signed)
PMR Admission Coordinator Pre-Admission Assessment  Patient: Robin [name] is an 63 y.o., female MRN: [id number] DOB: 01/03/1951 Height: 5\' 5"  (165.1 cm) Weight: 111.5 kg (245 lb 13 oz)              Insurance Information HMO:     PPO: yes     PCP:      IPA:      80/20:      OTHER:  PRIMARY: State BCBS      Policy#: [id number]      Subscriber: spouse CM Name: Greer EeJanet H   Phone#: [phone] ext [phone]     Fax#: [phone] Pre-Cert#: [id number] approved with need to update 03/27/2014. Admission originally denied but approved after peer to peer review and additional clinicals   Employer:  Benefits:  Phone #: [phone]     Name: 03/19/14 Eff. Date: 10/04/13     Deduct: $700      Out of Pocket Max: $3210 not include deductible      Life Max: none CIR: 80%      SNF: 80% Outpatient: 80%     Co-Pay: no visit limit Home Health: 80%      Co-Pay: 20% DME: 80%     Co-Pay: 20% Providers: in network  SECONDARY: none        Medicaid Application Date:       Case Manager:  Disability Application Date:       Case Worker:   Emergency Conservator, museum/galleryContact Information Contact Information   Name Relation Home Work Mobile   [place],Robert Spouse [phone]     Current Medical History  Patient Admitting Diagnosis:severe deconditioning due to multiple medical issues  History of Present Illness:Robin [name] is a 63 y.o. right-handed female with history of hypertension, atrial fibrillation with Coumadin therapy, peripheral vascular disease with chronic leg wounds. Patient independent prior to admission living with her husband and using a straight point cane.   Presented 03/16/2014 with generalized weakness and worsening of leg ulcers. Noted hyperkalemia 7.2 as well as hyponatremic at 111, BUN 139, creatinine 2.28. Patient did receive Kayexalate for hyperkalemia. IV fluid normal saline initiated for hyponatremia. Renal service consulted in relation to acute renal failure. Renal  ultrasound with no hydronephrosis. It was felt that severe hyponatremia possibly related to excess water consumption on top of AKI. Placed on vancomycin and Zosyn for chronic leg ulcers. X-rays of right and left foot showed no evidence of osteomyelitis however there was a focal lucency at the base of the right third metatarsal. Renal function continues to improve with latest creatinine 1.50 and BUN 76 on 03/21/14. Sodium level 130 on 03/21/2014. Patient remains on chronic Coumadin for atrial fibrillation. MRSA screening positive maintained on contact precautions.   Past Medical History  Past Medical History  Diagnosis Date  . Diabetes mellitus   . Hypertension   . Hyperlipidemia   . Atrial fibrillation   . Sleep apnea   . Cholelithiasis   . CAD (coronary artery disease)     Family History  family history includes COPD in her father; Drug abuse in her brother; Heart disease in her brother and brother; Heart disease (age of onset: 3655) in her father; Hypertension in her mother; Other in her brother and brother.  Prior Rehab/Hospitalizations: none   Current Medications  Current facility-administered medications:0.9 %  sodium chloride infusion, , Intravenous, Continuous, Lonia BloodJeffrey T McClung, MD, Last Rate: 10 mL/hr at 03/19/14 1354, 10 mL/hr at 03/19/14 1354;  acetaminophen (TYLENOL) suppository 650 mg,  650 mg, Rectal, Q6H PRN, Esperanza Sheets, MD;  acetaminophen (TYLENOL) tablet 650 mg, 650 mg, Oral, Q6H PRN, Esperanza Sheets, MD, 650 mg at 03/22/14 0035 albuterol (PROVENTIL) (2.5 MG/3ML) 0.083% nebulizer solution 2.5 mg, 2.5 mg, Nebulization, Q6H PRN, Esperanza Sheets, MD;  ALPRAZolam Prudy Feeler) tablet 0.25 mg, 0.25 mg, Oral, BID PRN, Lonia Blood, MD, 0.25 mg at 03/22/14 0035;  alum & mag hydroxide-simeth (MAALOX/MYLANTA) 200-200-20 MG/5ML suspension 15 mL, 15 mL, Oral, Q6H PRN, Lonia Blood, MD, 15 mL at 03/20/14 2138 buPROPion (WELLBUTRIN XL) 24 hr tablet 300 mg, 300 mg, Oral, Daily,  Esperanza Sheets, MD, 300 mg at 03/22/14 1013;  ciprofloxacin (CIPRO) tablet 500 mg, 500 mg, Oral, BID, Ripudeep K Rai, MD, 500 mg at 03/22/14 1012;  dextrose 50 % solution 25 mL, 25 mL, Intravenous, PRN, Esperanza Sheets, MD;  doxycycline (VIBRA-TABS) tablet 100 mg, 100 mg, Oral, Q12H, Lonia Blood, MD, 100 mg at 03/22/14 1012 ferumoxytol (FERAHEME) 510 mg in sodium chloride 0.9 % 100 mL IVPB, 510 mg, Intravenous, Q3 days, Sadie Haber, MD, 510 mg at 03/19/14 1352;  furosemide (LASIX) tablet 40 mg, 40 mg, Oral, Daily, Lonia Blood, MD, 40 mg at 03/22/14 1012;  HYDROcodone-acetaminophen (NORCO/VICODIN) 5-325 MG per tablet 1 tablet, 1 tablet, Oral, Q4H PRN, Roma Kayser Schorr, NP insulin aspart (novoLOG) injection 0-9 Units, 0-9 Units, Subcutaneous, TID WC, Lonia Blood, MD, 1 Units at 03/21/14 1717;  insulin glargine (LANTUS) injection 30 Units, 30 Units, Subcutaneous, QHS, Lonia Blood, MD, 30 Units at 03/21/14 2342;  metoprolol tartrate (LOPRESSOR) tablet 12.5 mg, 12.5 mg, Oral, BID, Lonia Blood, MD, 12.5 mg at 03/22/14 1013 ondansetron (ZOFRAN) injection 4 mg, 4 mg, Intravenous, Q6H PRN, Esperanza Sheets, MD, 4 mg at 03/20/14 0302;  ondansetron (ZOFRAN) tablet 4 mg, 4 mg, Oral, Q6H PRN, Esperanza Sheets, MD;  simvastatin (ZOCOR) tablet 20 mg, 20 mg, Oral, q1800, Esperanza Sheets, MD, 20 mg at 03/21/14 1716;  sodium bicarbonate tablet 650 mg, 650 mg, Oral, TID, Sadie Haber, MD, 650 mg at 03/22/14 1013 sodium chloride 0.9 % injection 3 mL, 3 mL, Intravenous, Q12H, Esperanza Sheets, MD, 3 mL at 03/22/14 1013;  Warfarin - Pharmacist Dosing Inpatient, , Does not apply, q1800, Fredrik Rigger, [facility]  Patients Current Diet: Carb Control 1000 cc fluid restriction per nephrology  Precautions / Restrictions Precautions Precautions: Fall Restrictions Weight Bearing Restrictions: No   Prior Activity Level Household: out into community maybe once per week. 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 Quit due to progressive issues with standing for long periods of time. Was Mod I with cane pta but becoming progressively weaker weeks to months pta. She assisted in the care of her handicapped son, Molly Maduro. Molly Maduro has epilepsy and has cognitive deficits. He is ambulatory and she describes him as mentally an 62 year old. She and her husband would assist him to shower, brush teeth, etc. Pt walking with cane only short distances, rarely drove and describes herself sedentary.  Home Assistive Devices / Equipment Home Assistive Devices/Equipment: Dan Humphreys (specify type) Home Equipment: Cane - single point;Wheelchair - manual;Toilet riser  Prior Functional Level Prior Function Level of Independence: Independent with assistive device(s);Needs assistance Gait / Transfers Assistance Needed: used cane up until a week ago ADL's / Homemaking Assistance Needed: sponge bath required, previous to BIL LE wounds showered independently. Dressing from recliner and husband completes LB (A)  Comments: has been sponge bathing for 3 years due to high side of bath  Current Functional Level Cognition  Overall Cognitive Status: Impaired/Different from baseline Current Attention Level: Sustained Orientation Level: Oriented X4 Following Commands: Follows multi-step commands with increased time Safety/Judgement: Decreased awareness of deficits General Comments: Pt. needs manual, demo  and verbal cueing for task completion. Pt. able to recall when I asked her to remember what command I had just given to her.      Extremity Assessment (includes Sensation/Coordination)          ADLs  Overall ADL's : Needs assistance/impaired Eating/Feeding: Set up;Sitting Eating/Feeding Details (indicate cue type and reason): pt sitting at EOB for drinking soda Grooming: Brushing hair;Set up;Sitting Upper Body Dressing : Minimal assistance;Sitting Lower Body Dressing:  Maximal assistance;Sitting/lateral leans;Bed level Lower Body Dressing Details (indicate cue type and reason): don socks Toilet Transfer: Minimal assistance;Moderate assistance;BSC Toilet Transfer Details (indicate cue type and reason): min a sit/stand with mod inst. and tactile guidance with ue placement on arm rests mod a to initiate pivotal steps. very anxious and required reassurement to ease the process Toileting- Clothing Manipulation and Hygiene: Moderate assistance;Sit to/from stand Toileting - Clothing Manipulation Details (indicate cue type and reason): simulated during pivot transfer Functional mobility during ADLs: Minimal assistance;Moderate assistance General ADL Comments: able to reach forward while in sitting but can not reach feet at this time for donning socks.  has reacher and would benefit from continued training with a/e for lb dressing.    Mobility  Overal bed mobility: Needs Assistance Bed Mobility: Rolling;Sidelying to Sit Rolling: Min assist;Mod assist Sidelying to sit: Min assist Supine to sit: Mod assist;+2 for physical assistance;HOB elevated Sit to supine: +2 for physical assistance;Max assist General bed mobility comments: step by step cueing for hand placement on bed rail    Transfers  Overall transfer level: Needs assistance Equipment used: Rolling walker (2 wheeled) Transfers: Sit to/from Stand Sit to Stand: Min assist Stand pivot transfers: Mod assist General transfer comment: min a for sit/stand mod a to initiate pivotal steps. still anxious and requires reasurement to proceed with desired tasks    Ambulation / Gait / Stairs / Wheelchair Mobility  Ambulation/Gait Ambulation/Gait assistance: Mod assist;+2 physical assistance Ambulation Distance (Feet): 30 Feet (15 x 2 with seated rest) Assistive device: Rolling walker (2 wheeled) Gait Pattern/deviations: Step-to pattern;Decreased step length - right;Decreased step length - left Gait velocity:  decreased General Gait Details: Pt. needed assist to advance RW  and verbal cues to step through with bilaterally.  She also needed prompting to increase step length especially on right LE    Posture / Balance Dynamic Sitting Balance Sitting balance - Comments: Pt. able to maintain sitting at EOB for 10 minutes and completed LE exercises while sitting.      Special needs/care consideration BiPAP/CPAP none Continuous Drip IV none Oxygen none Skin 6/14/15WOC wound consult note  Reason for Consult: Patient with chronic, non-healing LE ulcerations. Reports she has been seen in the outpatient wound are center by Dr. Wayland Denis, but has not been to the clinic in months. She states that, "Dr.Sanger told me that nothing more could be done for my legs." Presents also with intertriginous dermatitis (suspect fungal) beneath breasts and beneath abdominal panus, a full thickness ulceration on the upper right medial thigh near the labia and a pressure ulcer, Stage III, at the right buttock at the gluteal cleft.  Wound type: Intertriginous dermatitis with suspected fungal overgrowth and ulceration at right  medial thigh near perineum, pressure ulcer at right buttock at gluteal cleft and chronic LE ulcerations, suspect mixed etiology  Pressure Ulcer POA: Yes (at right buttock)  Measurement:Right buttock, Stage III PrU measuring 2cm x 2cm x 0.2cm with 40% necrotic yellow slough and 60% red, moist tissue. Right inner thigh, proximal and near perineum: 2.5cm x 3cm x 0.2cm full thickness tissue loss with mosist red tissue. Right LE with numerous pinpoint open areas and two ulcers: Distal full thickness ulcer measures 3cm x 1cm x 0.2cm, Proximal full thickness ulceration measures 2cm x 2.5cm x 0.2cm. Left LE with numerous pinpoint ulcerations and 1 ulcer at the lateral malleolus measuring 3cm x 2cm x 0.2cm. All LE ulcers are moist, red and clear of necrotic tissue.  Wound bed:As described above.  Drainage (amount,  consistency, odor) Serosanguinous exudate  Periwound:No LE edema, LEs with what appears to be hemosiderin staining vs rubor. Periwound in perineum is red, moist and with some evidence of epidermal peeling suggestive of fungal overgrowth.  Dressing procedure/placement/frequency: I will suggest a non-adherent dressing to the bilateral LEs (white petrolatum with padding of the legs and securement of the dressings with Kerlix changed once daily. If you feel that further workup is indicated, you might consult VVS service for continuing care. Under their direction, a referral to an outpatient wound care center may or may not be indicated. I have provided bilateral Prevalon Boots for pressure redistribution while in bed.I have also provided an antimicrobial textile for treatment of the intertriginous dermatitis at the panus and inframammary areas. A topical zinc-oxide paste is provided for the open area at the right medial and proximal thigh near the perineum. A soft silicone foam dressing is provided (as well as a therapeutic mattress replacement with low air loss feature) for treatment of the Stage III pressure ulcer.  WOC nursing team will not follow, but will remain available to this patient, the nursing and medical teams. Please re-consult if needed.  Thanks,  Ladona Mow, MSN, RN, GNP, [place], CWON-AP [phone])  03/18/2014 WOC wound consult note  Reason for Consult: Patient with chronic, non-healing LE ulcerations. Reports she has been seen in the outpatient wound are center by Dr. Wayland Denis, but has not been to the clinic in months. She states that, "Dr.Sanger told me that nothing more could be done for my legs." Presents also with intertriginous dermatitis (suspect fungal) beneath breasts and beneath abdominal panus, a full thickness ulceration on the upper right medial thigh near the labia and a pressure ulcer, Stage III, at the right buttock at the gluteal cleft.  Wound type: Intertriginous  dermatitis with suspected fungal overgrowth and ulceration at right medial thigh near perineum, pressure ulcer at right buttock at gluteal cleft and chronic LE ulcerations, suspect mixed etiology  Pressure Ulcer POA: Yes (at right buttock)  Measurement:Right buttock, Stage III PrU measuring 2cm x 2cm x 0.2cm with 40% necrotic yellow slough and 60% red, moist tissue. Right inner thigh, proximal and near perineum: 2.5cm x 3cm x 0.2cm full thickness tissue loss with mosist red tissue. Right LE with numerous pinpoint open areas and two ulcers: Distal full thickness ulcer measures 3cm x 1cm x 0.2cm, Proximal full thickness ulceration measures 2cm x 2.5cm x 0.2cm. Left LE with numerous pinpoint ulcerations and 1 ulcer at the lateral malleolus measuring 3cm x 2cm x 0.2cm. All LE ulcers are moist, red and clear of necrotic tissue.  Wound bed:As described above.  Drainage (amount, consistency, odor) Serosanguinous exudate  Periwound:No LE  edema, LEs with what appears to be hemosiderin staining vs rubor. Periwound in perineum is red, moist and with some evidence of epidermal peeling suggestive of fungal overgrowth.  Dressing procedure/placement/frequency: I will suggest a non-adherent dressing to the bilateral LEs (white petrolatum with padding of the legs and securement of the dressings with Kerlix changed once daily. If you feel that further workup is indicated, you might consult VVS service for continuing care. Under their direction, a referral to an outpatient wound care center may or may not be indicated. I have provided bilateral Prevalon Boots for pressure redistribution while in bed.I have also provided an antimicrobial textile for treatment of the intertriginous dermatitis at the panus and inframammary areas. A topical zinc-oxide paste is provided for the open area at the right medial and proximal thigh near the perineum. A soft silicone foam dressing is provided (as well as a therapeutic mattress replacement  with low air loss feature) for treatment of the Stage III pressure ulcer.  WOC nursing team will not follow, but will remain available to this patient, the nursing and medical teams. Please re-consult if needed.  Thanks,  Ladona Mow, MSN, RN, GNP, [place], CWON-AP [phone])    Bowel mgmt: last BM 6/17. quiac positive 6/16 Bladder mgmt: foley Diabetic mgmt Hgb A1C 7.2 on 03/18/14    Previous Home Environment Living Arrangements: Spouse/significant other;Children;Other (Comment) (one disabled child with epilepsy in his 53s, Molly Maduro)  Lives With: Spouse;Son;Other (Comment) Molly Maduro in his 54s, epilepsy and cognitive deficits) Available Help at Discharge: Family;Available PRN/intermittently;Other (Comment) (spouse works days, Molly Maduro goes to after [employer] daily Monday) Type of Home: [employer] Layout: Two level;Able to live on main level with bedroom/bathroom Home Access: Stairs to enter Entrance Stairs-Rails: Can reach both Entrance Stairs-Number of Steps: 3 Bathroom Shower/Tub: Tub/shower unit;Other (comment) (side of tub high therefore pt unable to step into tub theref) Bathroom Toilet: Standard Bathroom Accessibility: Yes How Accessible: Accessible via walker Home Care Services: No Additional Comments: using w/c increasingly more x3 days prior to admission. 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 [facility] is currently [facility] during [facility] day for day program.   Discharge Living Setting Plans for Discharge Living Setting: Patient's home;Lives with (comment);Other (Comment) (spouse a dn adult handicapped son, Molly Maduro. ) Type of Home at Discharge: House Discharge Home Layout: Two level;Full bath on main level;Able to live on main level with bedroom/bathroom Discharge Home Access: Stairs to enter Entrance Stairs-Rails: Can reach both;Right;Left Entrance Stairs-Number of Steps: 3 Discharge Bathroom Shower/Tub: Tub/shower unit Discharge Bathroom  Toilet: Standard Discharge Bathroom Accessibility: Yes How Accessible: Accessible via walker Does the patient have any problems obtaining your medications?: No  Social/Family/Support Systems Patient Roles: Spouse;Parent;Caregiver;Other (Comment) (pt was assisting in the care of son, Molly Maduro until she became) Contact Information: Jule Ser, spouse Anticipated Caregiver: Spouse Anticipated Caregiver's Contact Information: see above Ability/Limitations of Caregiver: spouse works in Consulting civil engineer at [employer] firm daily. He carrys son, Molly Maduro to adult after [facility] daily program daily and then picks him up to return home. Pt alone during the day Caregiver Availability: Evenings only Discharge Plan Discussed with Primary Caregiver: Yes Is Caregiver In Agreement with Plan?: Yes Does Caregiver/Family have Issues with Lodging/Transportation while Pt is in Rehab?: No  Goals/Additional Needs Patient/Family Goal for Rehab: Mod I with PT and OT Expected length of stay: ELOS 14 to 20 days Dietary Needs: fluid restrictions 1000 cc per day Pt/Family Agrees to Admission and willing to participate: Yes  Program Orientation Provided & Reviewed with Pt/Caregiver Including Roles  & Responsibilities: Yes  Decrease burden of Care through IP rehab admission: n/a  Possible need for SNF placement upon discharge:no  Patient Condition: This patient's medical and functional status has changed since the consult dated: 03/19/14 in which the Rehabilitation Physician determined and documented that the patient's condition is appropriate for intensive rehabilitative care in an inpatient rehabilitation facility. See "History of Present Illness" (above) for medical update. Functional changes are: overall min to mod assist. Patient's medical and functional status update has been discussed with the Rehabilitation physician and patient remains appropriate for inpatient rehabilitation. Will admit to inpatient rehab today.  Preadmission  Screen Completed By:  Clois DupesBoyette, Barbara Godwin, 03/22/2014 12:52 PM ______________________________________________________________________   Discussed status with Dr. Riley KillSwartz on 03/22/14 at 1252 and received telephone approval for admission today.  Admission Coordinator:  Clois DupesBoyette, Barbara Godwin, time [id number] Date 03/22/2014.

## 2014-03-19 NOTE — Progress Notes (Signed)
Physical Therapy Treatment Patient Details Name: Jewel BaizeJudith M Mailhot MRN: [id number] DOB: 09/18/1951 Today's Date: 03/19/2014    History of Present Illness Pt admit with bil LE cellulitis.  Afib.    PT Comments    Pt progressing slowly towards physical therapy goals. Session limited by pt fear of falling. Pt was able to stand and SPT bed>BSC>recliner without any buckling of the knees. Feel pt will be successful with the RW if she is willing to try it. Nursing education regarding amount of time recommended for pt to sit up in the recliner. Per pt, she was sitting up for an extended period of time yesterday and has been worn out since then. Will continue to follow and progress as able per POC.   Follow Up Recommendations  CIR;Supervision/Assistance - 24 hour     Equipment Recommendations  Other (comment) (TBD)    Recommendations for Other Services Rehab consult     Precautions / Restrictions Precautions Precautions: Fall Restrictions Weight Bearing Restrictions: No    Mobility  Bed Mobility Overal bed mobility: Needs Assistance;+2 for physical assistance Bed Mobility: Supine to Sit     Supine to sit: Mod assist;+2 for physical assistance;HOB elevated     General bed mobility comments: Pt able to transition to EOB with assist for LE movement as well as trunk elevation to full sitting.   Transfers Overall transfer level: Needs assistance Equipment used: 2 person hand held assist Transfers: Sit to/from UGI CorporationStand;Stand Pivot Transfers Sit to Stand: +2 physical assistance;Mod assist;+2 safety/equipment Stand pivot transfers: Mod assist;+2 physical assistance;+2 safety/equipment       General transfer comment: VC's for sequencing and technique. Pt very anxious about mobility and she has a big fear of falling. VC's for hand placement on seated surface for safety. Pt holding tight to therapist and tech during [facility] and then recliner. No knee buckling noted.   Ambulation/Gait                  Stairs            Wheelchair Mobility    Modified Rankin (Stroke Patients Only)       Balance Overall balance assessment: Needs assistance Sitting-balance support: Feet supported;Bilateral upper extremity supported Sitting balance-Leahy Scale: Fair Sitting balance - Comments: Pt able to maintain independent sitting EOB for short amounts of time. Generally requires at least 1 UE support.    Standing balance support: Bilateral upper extremity supported Standing balance-Leahy Scale: Zero                      Cognition Arousal/Alertness: Awake/alert Behavior During Therapy: Anxious Overall Cognitive Status: Within Functional Limits for tasks assessed                      Exercises      General Comments        Pertinent Vitals/Pain Vitals stable throughout session.     Home Living                      Prior Function            PT Goals (current goals can now be found in the care plan section) Acute Rehab PT Goals Patient Stated Goal: to get back to walking PT Goal Formulation: With patient Time For Goal Achievement: 03/31/14 Potential to Achieve Goals: Good Progress towards PT goals: Progressing toward goals    Frequency  Min 3X/week  PT Plan Current plan remains appropriate    Co-evaluation             End of Session Equipment Utilized During Treatment: Gait belt Activity Tolerance: Patient tolerated treatment well Patient left: in chair;with call bell/phone within reach     Time: 1445-1542 PT Time Calculation (min): 57 min  Charges:  $Gait Training: 8-22 mins $Therapeutic Activity: 38-52 mins                    G Codes:      Ruthann CancerHamilton, Laura 03/19/2014, 4:21 PM  Ruthann CancerLaura Hamilton, PT, DPT Acute Rehabilitation Services Pager: [phone]

## 2014-03-19 NOTE — Consult Note (Signed)
Physical Medicine and Rehabilitation Consult Reason for Consult: Deconditioning/cellulitis/metabolic acidosis Referring Physician: [facility]   HPI: Robin [name] is a 63 y.o. right-handed female with history of hypertension, atrial fibrillation with Coumadin therapy, peripheral vascular disease with chronic leg wounds. Patient independent prior to admission living with her husband and using a straight point cane. Presented 03/16/2014 with generalized weakness and worsening of leg ulcers. Noted hyperkalemia 7.2 as well as hyponatremic at 111, BUN 139, creatinine 2.28. Patient did receive Kayexalate for hyperkalemia. IV fluid normal saline initiated for hyponatremia. Renal service consulted in relation to acute renal failure. Renal ultrasound with no hydronephrosis. It was felt that severe hyponatremia possibly related to excess water consumption on top of AKI. Placed on vancomycin and Zosyn for chronic leg ulcers. X-rays of right and left foot showed no evidence of osteomyelitis however there was a focal lucency at the base of the right third metatarsal. Renal function continues to improve with latest creatinine 1.61 and BUN 78. Sodium level 123 on 03/19/2014. Patient remains on chronic Coumadin for atrial fibrillation. MRSA screening positive maintained on contact precautions. Physical occupational therapy evaluations completed 03/18/2014 with recommendations for physical medicine rehabilitation consult.   Review of Systems  Constitutional: Positive for malaise/fatigue.  Cardiovascular: Positive for palpitations.  Gastrointestinal: Positive for constipation.  Musculoskeletal: Positive for falls.  Psychiatric/Behavioral:       Anxiety  All other systems reviewed and are negative.  Past Medical History  Diagnosis Date  . Diabetes mellitus   . Hypertension   . Hyperlipidemia   . Atrial fibrillation   . Sleep apnea   . Cholelithiasis   . CAD (coronary artery disease)    Past Surgical  History  Procedure Laterality Date  . Tonsillectomy  1970s  . Hemorroidectomy  1990s  . Choledochal cyst excision  1980s  . Parathyroidectomy    . Endovenous ablation saphenous vein w/ laser  08-02-2011  Right greater saphenous vein   Family History  Problem Relation Age of Onset  . Hypertension Mother   . Heart disease Father [id number]    MI  . COPD Father   . Other Brother     heart issues  . Heart disease Brother   . Other Brother     heart issues  . Heart disease Brother   . Drug abuse Brother    Social History:  reports that she has never smoked. She has never used smokeless tobacco. She reports that she does not drink alcohol or use illicit drugs. Allergies: No Known Allergies Medications Prior to Admission  Medication Sig Dispense Refill  . buPROPion (WELLBUTRIN XL) 300 MG 24 hr tablet Take 1 tablet by mouth daily.       . Furosemide (LASIX PO) Take 40 mg by mouth daily.       . insulin aspart (NOVOLOG) 100 UNIT/ML injection Inject 40-42 Units into the skin 3 (three) times daily before meals.      . insulin glargine (LANTUS) 100 UNIT/ML injection Inject 30 Units into the skin at bedtime.       . metoprolol tartrate (LOPRESSOR) 25 MG tablet Take 25 mg by mouth 2 (two) times daily.      Marland Kitchen. neomycin-bacitracin-polymyxin (NEOSPORIN) ointment Apply 1 application topically 2 (two) times daily. apply to eye      . simvastatin (ZOCOR) 20 MG tablet Take 1 tablet by mouth daily.       Marland Kitchen. warfarin (COUMADIN) 5 MG tablet Take 5 mg by mouth daily. Take  as directed by [facility]        Home: Home Living Family/patient expects to be discharged to:: Private residence Living Arrangements: Spouse/significant other;Children Available Help at Discharge: Family;Available PRN/intermittently Type of Home: House Home Access: Stairs to enter [employer] of Steps: 3 Entrance Stairs-Rails: Can reach both Home Layout: Two level;Able to live on main level with bedroom/bathroom Home  Equipment: Gilmer Mor - single point;Wheelchair - manual;Toilet riser Additional Comments: using w/c increasingly more x3 days prior to admission. 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 [facility] is [facility] day for day program.   Functional History: Prior Function Level of Independence: Independent with assistive device(s);Needs assistance Gait / Transfers Assistance Needed: used cane up until a week ago ADL's / Homemaking Assistance Needed: sponge bath required, previous to BIL LE wounds showered independently. Dressing from recliner and husband completes LB (A) Functional Status:  Mobility: Bed Mobility Overal bed mobility: Needs Assistance;+2 for physical assistance Bed Mobility: Supine to Sit;Rolling Rolling: Max assist Supine to sit: +2 for physical assistance;Max assist;HOB elevated General bed mobility comments: pt log rolled to the Rt side due to c/o back pain. pt educated on back precaution to help with back pain during bed mobilty. pt educated on the need for frequent position changes to decr presssure wounds.  Transfers Overall transfer level: Needs assistance Equipment used: 2 person hand held assist Transfers: Sit to/from [employer] Sit to Stand: Max assist;+2 physical assistance Stand pivot transfers: Mod assist;+2 physical assistance General transfer comment: Pt needed verbalize education to help with anxiety prior to standing. pt very anxious to move with x2 females adn not males. pt once standing able to side step to 3n1 and then chair.       ADL: ADL Overall ADL's : Needs assistance/impaired Eating/Feeding: Set up;Sitting Eating/Feeding Details (indicate cue type and reason): Pt with poor awareness to PO intake with current medical state. Pt asking spouse to go to the [facility] cafe to obtain items not on diet plan. pt requesting incr liquid intake. pt educated during session again of fluid  restrictions. pt states "but the doctor says I am getting better so I should be able to have more" Grooming: Wash/dry face;Set up;Sitting Toilet Transfer: +2 for physical assistance;Moderate assistance;BSC Toilet Transfer Details (indicate cue type and reason): required max cueing and very anxious initially with attempt. pt is able to anterior weight shift and step to commode with cueing Toileting- Clothing Manipulation and Hygiene: Total assistance General ADL Comments: Pt noted to have decr BP on arrival with Map 55. Pt assisted to EOB with no compliants. Pt with incr Map . pt transfered to chair with MAP 72 . pt no complaints and reports "this feels so good thank you" Pt tearful in chair and tells therapist how pastor prayed for her this morning and now she is able to transfer to the chair. pt very happy with progress.   Cognition: Cognition Overall Cognitive Status: Impaired/Different from baseline Orientation Level: Oriented X4 Cognition Arousal/Alertness: Awake/alert Behavior During Therapy: Anxious Overall Cognitive Status: Impaired/Different from baseline Area of Impairment: Orientation;Attention;Memory;Following commands;Safety/judgement;Problem solving;Awareness Orientation Level: Disoriented to;Situation (reports fireman dropped her) Current Attention Level: Sustained Memory: Decreased short-term memory Following Commands: Follows multi-step commands with increased time Safety/Judgement: Decreased awareness of safety;Decreased awareness of deficits Awareness: Emergent Problem Solving: Slow processing;Decreased initiation;Difficulty sequencing General Comments: pt reports fireman dropping patient during transfer. Spouse states "why did she say that? thats not true." pt  asked to recount events PTA and never mentioned falling with fireman assistance. OT cueing patient to this fact and pt states "well I remember falling with them" Pt asking if RN phone in hall was personal cell phone  ringing. Spouse reports your phone is not here. pt denies this fact and insist phone is present.   Blood pressure 95/52, pulse 70, temperature 98 F (36.7 C), temperature source Oral, resp. rate 19, height 5\' 6"  (1.676 m), weight 110 kg (242 lb 8.1 oz), SpO2 98.00%. Physical Exam  Vitals reviewed. Constitutional: She is oriented to person, place, and time.  HENT:  Head: Normocephalic.  Eyes: EOM are normal.  Neck: Normal range of motion. Neck supple. No thyromegaly present.  Cardiovascular:  Cardiac rate controlled  Respiratory: Effort normal and breath sounds normal. No respiratory distress.  GI: Soft. Bowel sounds are normal. She exhibits no distension.  Musculoskeletal:  1+ to 2+ edema bilateral LE  Neurological: She is alert and oriented to person, place, and time.  Follows full commands. Alert and appropriate. No sensory deficits except for the feet which are slightly dimnished for light touch. UE's grossly 4 to 4+/.5 prox to distal. LE's 3- HF, 2+ to 3- KE and 3/5 ankles (knees/ankles limited partly due to dressings, edema also)  Skin:  Bilateral lower extremities with dry dressing in place.  Bruises scattered in UE's. A few abrasions areas over toes are visible.   Psychiatric: She has a normal mood and affect. Her behavior is normal. Judgment and thought content normal.    Results for orders placed during the hospital encounter of 03/16/14 (from the past 24 hour(s))  GLUCOSE, CAPILLARY     Status: Abnormal   Collection Time    03/18/14  7:55 AM      Result Value Ref Range   Glucose-Capillary 171 (*) 70 - 99 mg/dL  BASIC METABOLIC PANEL     Status: Abnormal   Collection Time    03/18/14  9:36 AM      Result Value Ref Range   Sodium 126 (*) 137 - 147 mEq/L   Potassium 2.6 (*) 3.7 - 5.3 mEq/L   Chloride 88 (*) 96 - 112 mEq/L   CO2 23  19 - 32 mEq/L   Glucose, Bld 172 (*) 70 - 99 mg/dL   BUN 86 (*) 6 - 23 mg/dL   Creatinine, Ser 1.61 (*) 0.50 - 1.10 mg/dL   Calcium 7.7 (*)  8.4 - 10.5 mg/dL   GFR calc non Af Amer 35 (*) >90 mL/min   GFR calc Af Amer 41 (*) >90 mL/min  GLUCOSE, CAPILLARY     Status: Abnormal   Collection Time    03/18/14 11:32 AM      Result Value Ref Range   Glucose-Capillary 171 (*) 70 - 99 mg/dL  BASIC METABOLIC PANEL     Status: Abnormal   Collection Time    03/18/14  4:00 PM      Result Value Ref Range   Sodium 125 (*) 137 - 147 mEq/L   Potassium 3.0 (*) 3.7 - 5.3 mEq/L   Chloride 90 (*) 96 - 112 mEq/L   CO2 21  19 - 32 mEq/L   Glucose, Bld 176 (*) 70 - 99 mg/dL   BUN 80 (*) 6 - 23 mg/dL   Creatinine, Ser 0.96 (*) 0.50 - 1.10 mg/dL   Calcium 7.4 (*) 8.4 - 10.5 mg/dL   GFR calc non Af Amer 35 (*) >90 mL/min   GFR calc  Af Amer 40 (*) >90 mL/min  IRON AND TIBC     Status: Abnormal   Collection Time    03/18/14  4:00 PM      Result Value Ref Range   Iron 17 (*) 42 - 135 ug/dL   TIBC 981  191 - 478 ug/dL   Saturation Ratios 6 (*) 20 - 55 %   UIBC 274  125 - 400 ug/dL  FERRITIN     Status: None   Collection Time    03/18/14  4:00 PM      Result Value Ref Range   Ferritin 18  10 - 291 ng/mL  GLUCOSE, CAPILLARY     Status: Abnormal   Collection Time    03/18/14  5:15 PM      Result Value Ref Range   Glucose-Capillary 160 (*) 70 - 99 mg/dL   Comment 1 Notify RN    GLUCOSE, CAPILLARY     Status: Abnormal   Collection Time    03/18/14  9:46 PM      Result Value Ref Range   Glucose-Capillary 166 (*) 70 - 99 mg/dL  BASIC METABOLIC PANEL     Status: Abnormal   Collection Time    03/19/14  3:05 AM      Result Value Ref Range   Sodium 123 (*) 137 - 147 mEq/L   Potassium 3.4 (*) 3.7 - 5.3 mEq/L   Chloride 87 (*) 96 - 112 mEq/L   CO2 19  19 - 32 mEq/L   Glucose, Bld 145 (*) 70 - 99 mg/dL   BUN 78 (*) 6 - 23 mg/dL   Creatinine, Ser 2.95 (*) 0.50 - 1.10 mg/dL   Calcium 7.7 (*) 8.4 - 10.5 mg/dL   GFR calc non Af Amer 33 (*) >90 mL/min   GFR calc Af Amer 39 (*) >90 mL/min  PROTIME-INR     Status: Abnormal   Collection Time     03/19/14  3:05 AM      Result Value Ref Range   Prothrombin Time 44.1 (*) 11.6 - 15.2 seconds   INR 4.95 (*) 0.00 - 1.49  RETICULOCYTES     Status: Abnormal   Collection Time    03/19/14  3:05 AM      Result Value Ref Range   Retic Ct Pct 4.3 (*) 0.4 - 3.1 %   RBC. 3.86 (*) 3.87 - 5.11 MIL/uL   Retic Count, Manual 166.0  19.0 - 186.0 K/uL  CBC     Status: Abnormal   Collection Time    03/19/14  3:05 AM      Result Value Ref Range   WBC 12.1 (*) 4.0 - 10.5 K/uL   RBC 3.86 (*) 3.87 - 5.11 MIL/uL   Hemoglobin 7.9 (*) 12.0 - 15.0 g/dL   HCT 62.1 (*) 30.8 - 65.7 %   MCV 68.4 (*) 78.0 - 100.0 fL   MCH 20.5 (*) 26.0 - 34.0 pg   MCHC 29.9 (*) 30.0 - 36.0 g/dL   RDW 84.6 (*) 96.2 - 95.2 %   Platelets 365  150 - 400 K/uL   No results found.  Assessment/Plan: Diagnosis: severe deconditioning due to multiple medical issues 1. Does the need for close, 24 hr/day medical supervision in concert with the patient's rehab needs make it unreasonable for this patient to be served in a less intensive setting? Yes 2. Co-Morbidities requiring supervision/potential complications: sepsis, acute renal failure, afib 3. Due to bladder management, bowel management, safety, skin/wound  care, disease management, medication administration, pain management and patient education, does the patient require 24 hr/day rehab nursing? Yes 4. Does the patient require coordinated care of a physician, rehab nurse, PT (1-2 hrs/day, 5 days/week) and OT (1-2 hrs/day, 5 days/week) to address physical and functional deficits in the context of the above medical diagnosis(es)? Yes Addressing deficits in the following areas: balance, endurance, locomotion, strength, transferring, bowel/bladder control, bathing, dressing, feeding, grooming, toileting and psychosocial support 5. Can the patient actively participate in an intensive therapy program of at least 3 hrs of therapy per day at least 5 days per week? Yes 6. The potential for  patient to make measurable gains while on inpatient rehab is excellent 7. Anticipated functional outcomes upon discharge from inpatient rehab are modified independent  with PT, modified independent with OT, n/a with SLP. 8. Estimated rehab length of stay to reach the above functional goals is: 14-20 days 9. Does the patient have adequate social supports to accommodate these discharge functional goals? Yes 10. Anticipated D/C setting: Home 11. Anticipated post D/C treatments: HH therapy and Outpatient therapy 12. Overall Rehab/Functional Prognosis: excellent  RECOMMENDATIONS: This patient's condition is appropriate for continued rehabilitative care in the following setting: CIR Patient has agreed to participate in recommended program. Yes Note that insurance prior authorization may be required for reimbursement for recommended care.  Comment: Pt is motivated. She lives at home with disabled son and husband who works. Needs to be modified independent during the day. Rehab Admissions Coordinator to follow up.  Thanks,  Ranelle OysterZachary T. Swartz, MD, [place] DomFAAPMR     03/19/2014

## 2014-03-19 NOTE — Progress Notes (Signed)
ANTICOAGULATION CONSULT NOTE - Follow Up Consult  Pharmacy Consult for Coumadin  Indication: Afib  No Known Allergies  Patient Measurements: Height: 5\' 6"  (167.6 cm) Weight: 242 lb 8.1 oz (110 kg) IBW/kg (Calculated) : 59.3 Heparin Dosing Weight:   Vital Signs: Temp: 98.7 F (37.1 C) (06/16 0800) Temp src: Oral (06/16 0800) BP: 99/48 mmHg (06/16 0918) Pulse Rate: 72 (06/16 0918)  Labs:  Recent Labs  03/17/14 0342  03/18/14 0440 03/18/14 0936 03/18/14 1600 03/19/14 0305  HGB 8.8*  --  8.6*  --   --  7.9*  HCT 28.4*  --  28.2*  --   --  26.4*  PLT 419*  --  PLATELET CLUMPS NOTED ON SMEAR, COUNT APPEARS ADEQUATE  --   --  365  LABPROT 29.5*  --  41.0*  --   --  44.1*  INR 2.93*  --  4.50*  --   --  4.95*  CREATININE 1.83*  < > 1.58* 1.55* 1.56* 1.61*  < > = values in this interval not displayed.  Estimated Creatinine Clearance: 46.1 ml/min (by C-G formula based on Cr of 1.61).  Assessment: 63 yo lady admitted 6/13 with PMH of HTN, HPL, A fib on AC/coumadin, IDDM, PAD, venous insufficiency chronic leg wounds presented with progressive weakness and worsening of leg ulcers to start broad spectrum antibiotics. INR 2.32 on admit. Electrolyte abnormalities K 7.4, SCr 2.28, Na 111, Gluc 217, CO2 15   PMH of HTN, HLD, A fib on AC/coumadin, IDDM, PAD, venous insufficiency, DM  Anticoagulation: Coumadin PTA 5mg  daily, INR today 4.95 (up).  No Coumadin given yesterday.  Infectious Disease: WBC 12.1, afebrile, Abx for non-healing LE ulceration and multiple other dermatitis manifestations,wounds., and ulcers  6/13 Vanc>>6/15 6/13 Zosyn>>6/15  6/13 Wound>> 6/13 BC x 2>> ngtd 6/13 MRSA screen (+)  Cardiovascular: hx HN, HLD, afib, CAD - BP 99/48, HR 72. Meds: metoprolol, Zocor  Endocrinology: hx DM, gluc 217 on admission in DKA, CBGs 140-170s on SSI and Lantus  Gastrointestinal / Nutrition: no diet orders  Neurology: wellbutrin  Nephrology: Acute kidney injury likely  contributing to metabolic acidosis. Urinary retention. SCr 1.61 (stable), CrCl 42 ml/min, renal US w/o acute findings. Hyponatremia (back down a bit to 123) thought to be d/t excessive water consumption. Patient drinks "one gallon of water per day." K=3 s/p kayexelate; today = 3.4   Pulmonary: Room Air  Hematology / Oncology: Hgb down 7.9, Plts ok. ?anemia due to hydration?  PTA Medication Issues: home Novolog prior to meals   Goal of Therapy:  INR 2-3 Monitor platelets by anticoagulation protocol: Yes   Plan:  Continue to hold Coumadin Continue daily INR.  Tad MooreJessica Emunah Texidor, Pharm D, BCPS  Clinical Pharmacist Pager [phone]  03/19/2014 10:48 AM

## 2014-03-19 NOTE — Progress Notes (Signed)
I met with pt at bedside to discuss an inpt rehab admission when medically ready. Pt is in agreement to admission pending [employer] insurance approval. I will begin authorization. I will follow up tomorrow. [phone] 

## 2014-03-20 LAB — CBC
HEMATOCRIT: 28.1 % — AB (ref 36.0–46.0)
Hemoglobin: 8.5 g/dL — ABNORMAL LOW (ref 12.0–15.0)
MCH: 20.9 pg — ABNORMAL LOW (ref 26.0–34.0)
MCHC: 30.2 g/dL (ref 30.0–36.0)
MCV: 69.2 fL — ABNORMAL LOW (ref 78.0–100.0)
Platelets: 368 10*3/uL (ref 150–400)
RBC: 4.06 MIL/uL (ref 3.87–5.11)
RDW: 19.2 % — ABNORMAL HIGH (ref 11.5–15.5)
WBC: 10.7 10*3/uL — AB (ref 4.0–10.5)

## 2014-03-20 LAB — WOUND CULTURE

## 2014-03-20 LAB — BASIC METABOLIC PANEL
BUN: 76 mg/dL — AB (ref 6–23)
CO2: 21 mEq/L (ref 19–32)
Calcium: 8.1 mg/dL — ABNORMAL LOW (ref 8.4–10.5)
Chloride: 90 mEq/L — ABNORMAL LOW (ref 96–112)
Creatinine, Ser: 1.69 mg/dL — ABNORMAL HIGH (ref 0.50–1.10)
GFR calc Af Amer: 37 mL/min — ABNORMAL LOW (ref >90)
GFR, EST NON AFRICAN AMERICAN: 32 mL/min — AB (ref >90)
Glucose, Bld: 174 mg/dL — ABNORMAL HIGH (ref 70–99)
POTASSIUM: 3.4 meq/L — AB (ref 3.7–5.3)
SODIUM: 126 meq/L — AB (ref 137–147)

## 2014-03-20 LAB — GLUCOSE, CAPILLARY
GLUCOSE-CAPILLARY: 153 mg/dL — AB (ref 70–99)
Glucose-Capillary: 145 mg/dL — ABNORMAL HIGH (ref 70–99)
Glucose-Capillary: 121 mg/dL — ABNORMAL HIGH (ref 70–99)
Glucose-Capillary: 119 mg/dL — ABNORMAL HIGH (ref 70–99)
Glucose-Capillary: 136 mg/dL — ABNORMAL HIGH (ref 70–99)

## 2014-03-20 LAB — PROTIME-INR
INR: 3.47 — ABNORMAL HIGH (ref 0.00–1.49)
Prothrombin Time: 33.6 seconds — ABNORMAL HIGH (ref 11.6–15.2)

## 2014-03-20 MED ORDER — ALPRAZOLAM 0.25 MG PO TABS
0.2500 mg | ORAL_TABLET | Freq: Two times a day (BID) | ORAL | Status: DC | PRN
  Administered 2014-03-20 – 2014-03-22 (×4): 0.25 mg via ORAL
  Filled 2014-03-20 (×4): qty 1

## 2014-03-20 MED ORDER — ALUM & MAG HYDROXIDE-SIMETH 200-200-20 MG/5ML PO SUSP
15.0000 mL | Freq: Four times a day (QID) | ORAL | Status: DC | PRN
  Administered 2014-03-20 (×2): 15 mL via ORAL
  Filled 2014-03-20 (×3): qty 30

## 2014-03-20 MED ORDER — WARFARIN SODIUM 2 MG PO TABS
2.0000 mg | ORAL_TABLET | Freq: Once | ORAL | Status: AC
  Administered 2014-03-20: 2 mg via ORAL
  Filled 2014-03-20: qty 1

## 2014-03-20 NOTE — Progress Notes (Signed)
Occupational Therapy Treatment Patient Details Name: Robin [name] MRN: [id number] DOB: 12/20/1950 Today's Date: 03/20/2014    History of present illness Pt admit with bil LE cellulitis.  Afib.   OT comments  Pt demonstrates bed mobility this session and working on pressure relief. Pt educated on the need to change positions frequently and side lying at end of session to avoid supine on back position.   Follow Up Recommendations  CIR    Equipment Recommendations  3 in 1 bedside comode;Hospital bed    Recommendations for Other Services Rehab consult    Precautions / Restrictions Precautions Precautions: Fall       Mobility Bed Mobility Overal bed mobility: Needs Assistance;+2 for physical assistance;+ 2 for safety/equipment Bed Mobility: Supine to Sit;Sit to Supine;Rolling Rolling: Max assist   Supine to sit: Mod assist;+2 for physical assistance;HOB elevated Sit to supine: +2 for physical assistance;Max assist   General bed mobility comments: Pt requires cues to sequence bed mobility and progress to EOB> Pt reaching for therapist due to body habitus. Pt required total (A) to position bil LE back on bed surface.  Transfers Overall transfer level: Needs assistance Equipment used: 2 person hand held assist Transfers: Sit to/from Stand Sit to Stand: +2 physical assistance;Max assist         General transfer comment: vc for hand placement and anterior weight shift    Balance Overall balance assessment: Needs assistance Sitting-balance support: Feet supported;Bilateral upper extremity supported Sitting balance-Leahy Scale: Fair     Standing balance support: Bilateral upper extremity supported;During functional activity Standing balance-Leahy Scale: Zero                     ADL Overall ADL's : Needs assistance/impaired Eating/Feeding: Set up;Sitting Eating/Feeding Details (indicate cue type and reason): pt sitting at EOB for drinking soda Grooming:  Wash/dry face;Set up;Sitting               Lower Body Dressing: Total assistance Lower Body Dressing Details (indicate cue type and reason): don socks             Functional mobility during ADLs: +2 for physical assistance;Maximal assistance General ADL Comments: Pt supine on arrival and reports very poor sleep in the PM due to sound. Pt requesting soda. RN reports okay to provide soda. Pt supine <> sit EOB to drink soda. [facility]. Pt log rolled for new pad. Pt noted ot have large sacral wound without dressing. RN notified and dressing placed on wound. wound currently bleeding       Vision                     Perception     Praxis      Cognition   Behavior During Therapy: Anxious Overall Cognitive Status: Impaired/Different from baseline       Memory: Decreased short-term memory    Safety/Judgement: Decreased awareness of deficits Awareness: Emergent Problem Solving: Slow processing      Extremity/Trunk Assessment               Exercises     Shoulder Instructions       General Comments      Pertinent Vitals/ Pain       Hr incr with mobility        Pt reports dizziness with static standing unable to obtain BP due to cuff size. Pt returned supine and BP taken with RN (see vitals)  Home Living                                          Prior Functioning/Environment              Frequency Min 2X/week     Progress Toward Goals  OT Goals(current goals can now be found in the care plan section)  Progress towards OT goals: Progressing toward goals  Acute Rehab OT Goals Patient Stated Goal: to get back to walking OT Goal Formulation: With patient/family Time For Goal Achievement: 04/01/14 Potential to Achieve Goals: Good ADL Goals Pt Will Perform Grooming: with set-up;with min assist;standing Pt Will Perform Upper Body Bathing: with min assist;sitting Pt Will Transfer to Toilet:  with mod assist;bedside commode Additional ADL Goal #1: Pt will complete bed mobility with hob < 20 degrees Bedrails PRN min (A)  Plan Discharge plan remains appropriate    Co-evaluation                 End of Session     Activity Tolerance Patient tolerated treatment well   Patient Left in bed;with call bell/phone within reach;with family/visitor present   Nurse Communication Mobility status;Precautions        Time: [id number] OT Time Calculation (min): 34 min  Charges: OT General Charges $OT Visit: 1 Procedure OT Treatments $Self Care/Home Management : 8-22 mins $Therapeutic Activity: 8-22 mins  Robin [name] 03/20/2014, 10:51 AM Pager: [phone]

## 2014-03-20 NOTE — Progress Notes (Signed)
[place] [employer] has denied the request for an inpt rehab admission. I spoke with pt and she is aware. Pt is requesting an appeal. I will contact [place] [employer] for further review of my request. [phone]

## 2014-03-20 NOTE — Progress Notes (Signed)
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  Has been started on oral sodium bicarb goal is to keep CO2 20-22.  This of course does not fully explain her hyponatremia, but again in the past she has had a tendency to drink a fair amount of water.  She is maintained chronically on lasix at home - not on now. Continue 1000 cc water restriction. (pt aware). Off IVF. Back on small dose furosemide. Na continues to improve.  2 Hyperkalemia, symptomatic with weakness-resolved and in fact HYPOkalemic post treatment - repleted (in the end suspect will return to her usual tendency to runs on the higher K side of normal; the lasix should attenuate that some.  3 Met acidosis, better - initially thought due to obstruction which may have played some role, but runs low normal CO2's at baseline (20 in April 2014) - resolved after relief of obstruction and IV sodium bicarbonate; drifting back down. Added po bicarb as noted above. Goal CO2 20-22 4 Urinary retention with AKI on CKD - improved from admission value of 2.28  (creatinine 01/2013 1.32 and in 11/2012 was 1.45).  Current creatinine may actually be close to her baseline (will check with Dr. Orest Dikes office for more recent) 5 Anemia - big drop in Hb post IVF hydration; very iron deficient; dose with Feraheme X 2 and check stools.Hb has dropped 11->7.9 since adm 6 Chronic leg ulcers - vanco and zosyn per primary  service 7 AFib on coumadin - per pharm; INR supratherapeutic 8 HTN 9 HLD 10 DM 11 MRSA screen +  At this point, with sodium improving, bicarb improving, potassium normalizing and off IVF/back on lasix - expect continued improvement.  Her current creatinine may be her new baseline.  I will see her in the office for a followup visit after this hospitalization for her CKD3.  I will sign off at this time - please call if further issues arise.   Subjective: Feels better Says not dizzy Legs continue to weep (as they have for "years" per her husband) Evaluated by rehab - plans for at least 2 weeks of CIR they say  Objective: Vital signs in last 24 hours: Temp:  [97.7 F (36.5 C)-98 F (36.7 C)] 97.7 F (36.5 C) (06/17 0800) Pulse Rate:  [55-78] 77 (06/17 1017) Resp:  [10-25] 15 (06/17 0800) BP: (80-121)/(36-77) 114/52 mmHg (06/17 1017) SpO2:  [97 %-100 %] 100 % (06/17 0800) Weight:  [110 kg (242 lb 8.1 oz)] 110 kg (242 lb 8.1 oz) (06/17 0431) Weight change: 0 kg (0 lb)  Intake/Output from previous day: 06/16 0701 - 06/17 0700 In: 328 [I.V.:228; IV Piggyback:100] Out: 801 [Urine:800; Stool:1] Intake/Output this shift: Total I/O In: 3 [I.V.:3] Out: 1 [Stool:1]  PHYSICAL EXAMINATION General appearance: alert and cooperative VS as noted Lungs clear Heart AFF rate 70's GI: soft, non-tender;  bowel sounds normal; not tender Extremities: chronic changes LE's with edema and weeping has soaked kerlix and bedclothes Foley clear urine   Recent Labs  03/19/14 0305 03/20/14 0341  WBC 12.1* 10.7*  HGB 7.9* 8.5*  HCT 26.4* 28.1*  PLT 365 368    Recent Labs  03/19/14 1556 03/20/14 0341  NA 125* 126*  K 3.3* 3.4*  CL 90* 90*  CO2 19 21  GLUCOSE 241* 174*  BUN 77* 76*  CREATININE 1.71* 1.69*  CALCIUM 8.0* 8.1*   Results for Robin [name], Robin [name] (MRN [id number]) as of 03/18/2014 10:03  Ref. Range 03/16/2014 22:37  Osmolality, Ur Latest Range: [phone] mOsm/kg 424  Sodium, Ur  No range found <20  Creatinine, Urine No range found 83.08   Calculated fractional sodium excretion: 0.24% on admission  Scheduled: . buPROPion  300 mg Oral Daily  . Chlorhexidine Gluconate Cloth  6 each Topical Q0600  . doxycycline  100 mg Oral Q12H  . ferumoxytol  510 mg Intravenous Q3 days  . furosemide  40 mg Oral Daily  . insulin aspart  0-9 Units Subcutaneous TID WC  . insulin glargine  30 Units Subcutaneous QHS  . metoprolol tartrate  12.5 mg Oral BID  . mupirocin ointment  1 application Nasal BID  . simvastatin  20 mg Oral q1800  . sodium bicarbonate  650 mg Oral TID  . sodium chloride  3 mL Intravenous Q12H  . warfarin  2 mg Oral ONCE-1800  . Warfarin - Pharmacist Dosing Inpatient   Does not apply q1800  Infusions: . sodium chloride 10 mL/hr (03/19/14 1354)     LOS: 4 days  DUNHAM,CYNTHIA B 03/20/2014,11:15 AM

## 2014-03-20 NOTE — H&P (Signed)
Physical Medicine and Rehabilitation Admission H&P    Chief Complaint  Patient presents with  . Weakness  . Wound Infection  : HPI: Robin [name] is a 63 y.o. right-handed female with history of hypertension, atrial fibrillation with Coumadin therapy, peripheral vascular disease with chronic leg wounds. Patient independent prior to admission living with her husband and using a straight point cane. Presented 03/16/2014 with generalized weakness and worsening of leg ulcers. Noted hyperkalemia 7.2 as well as hyponatremic at 111, BUN 139, creatinine 2.28. Patient did receive Kayexalate for hyperkalemia. IV fluid normal saline initiated for hyponatremia. Renal service consulted in relation to acute renal failure. Renal ultrasound with no hydronephrosis. It was felt that severe hyponatremia possibly related to excess water consumption on top of AKI. Placed on vancomycin and Zosyn for chronic leg ulcers and transition to oral antibiotics. X-rays of right and left foot showed no evidence of osteomyelitis however there was a focal lucency at the base of the right third metatarsal. Renal function continues to improve with latest creatinine 1.55 and BUN 65. Sodium level 130 on 03/20/2014. Patient remains on chronic Coumadin for atrial fibrillation. MRSA screening positive maintained on contact precautions. Physical and occupational therapy evaluations completed 03/18/2014 with recommendations for physical medicine rehabilitation consult. Patient was admitted for comprehensive rehabilitation program   ROS Review of Systems  Constitutional: Positive for malaise/fatigue.  Cardiovascular: Positive for palpitations.  Gastrointestinal: Positive for constipation.  Musculoskeletal: Positive for falls.  Psychiatric/Behavioral:  Anxiety /depression All other systems reviewed and are negative  Past Medical History  Diagnosis Date  . Diabetes mellitus   . Hypertension   . Hyperlipidemia   . Atrial  fibrillation   . Sleep apnea   . Cholelithiasis   . CAD (coronary artery disease)    Past Surgical History  Procedure Laterality Date  . Tonsillectomy  1970s  . Hemorroidectomy  1990s  . Choledochal cyst excision  1980s  . Parathyroidectomy    . Endovenous ablation saphenous vein w/ laser  08-02-2011  Right greater saphenous vein   Family History  Problem Relation Age of Onset  . Hypertension Mother   . Heart disease Father 51    MI  . COPD Father   . Other Brother     heart issues  . Heart disease Brother   . Other Brother     heart issues  . Heart disease Brother   . Drug abuse Brother    Social History:  reports that she has never smoked. She has never used smokeless tobacco. She reports that she does not drink alcohol or use illicit drugs. Allergies: No Known Allergies Medications Prior to Admission  Medication Sig Dispense Refill  . buPROPion (WELLBUTRIN XL) 300 MG 24 hr tablet Take 1 tablet by mouth daily.       . Furosemide (LASIX PO) Take 40 mg by mouth daily.       . insulin aspart (NOVOLOG) 100 UNIT/ML injection Inject 40-42 Units into the skin 3 (three) times daily before meals.      . insulin glargine (LANTUS) 100 UNIT/ML injection Inject 30 Units into the skin at bedtime.       . metoprolol tartrate (LOPRESSOR) 25 MG tablet Take 25 mg by mouth 2 (two) times daily.      Marland Kitchen neomycin-bacitracin-polymyxin (NEOSPORIN) ointment Apply 1 application topically 2 (two) times daily. apply to eye      . simvastatin (ZOCOR) 20 MG tablet Take 1 tablet by mouth daily.       Marland Kitchen  warfarin (COUMADIN) 5 MG tablet Take 5 mg by mouth daily. Take as directed by [facility]        Home: Home Living Family/patient expects to be discharged to:: Private residence Living Arrangements: Spouse/significant other;Children;Other (Comment) (one disabled child with epilepsy in his 69s, [name]) Available Help at Discharge: Family;Available PRN/intermittently;Other (Comment) (spouse works  days, [name] goes to after [employer] daily Monday) Type of Home: House Home Access: Stairs to enter [employer] of Steps: 3 Entrance Stairs-Rails: Can reach both Home Layout: Two level;Able to live on main level with bedroom/bathroom Home Equipment: Kasandra Knudsen - single point;Wheelchair - manual;Toilet riser Additional Comments: using w/c increasingly more x3 days prior to admission. 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 [facility].   Lives With: Spouse;Son;Other (Comment) [name] in his 64s, epilepsy and cognitive deficits)   Functional History: Prior Function Level of Independence: Independent with assistive device(s);Needs assistance Gait / Transfers Assistance Needed: used cane up until a week ago ADL's / Homemaking Assistance Needed: sponge bath required, previous to BIL LE wounds showered independently. Dressing from recliner and husband completes LB (A) Comments: has been sponge bathing for 3 years due to high side of bath  Functional Status:  Mobility: Bed Mobility Overal bed mobility: Needs Assistance;+2 for physical assistance Bed Mobility: Supine to Sit Rolling: Max assist Supine to sit: Mod assist;+2 for physical assistance;HOB elevated General bed mobility comments: Pt able to transition to EOB with assist for LE movement as well as trunk elevation to full sitting.  Transfers Overall transfer level: Needs assistance Equipment used: 2 person hand held assist Transfers: Sit to/from [facility] Sit to Stand: +2 physical assistance;Mod assist;+2 safety/equipment Stand pivot transfers: Mod assist;+2 physical assistance;+2 safety/equipment General transfer comment: VC's for sequencing and technique. Pt very anxious about mobility and she has a big fear of falling. VC's for hand placement on seated surface for safety. Pt holding tight to therapist and tech during  SPT to [facility] and then recliner. No knee buckling noted.       ADL: ADL Overall ADL's : Needs assistance/impaired Eating/Feeding: Set up;Sitting Eating/Feeding Details (indicate cue type and reason): Pt with poor awareness to PO intake with current medical state. Pt asking spouse to go to the [facility] cafe to obtain items not on diet plan. pt requesting incr liquid intake. pt educated during session again of fluid restrictions. pt states "but the doctor says I am getting better so I should be able to have more" Grooming: Wash/dry face;Set up;Sitting Toilet Transfer: +2 for physical assistance;Moderate assistance;BSC Toilet Transfer Details (indicate cue type and reason): required max cueing and very anxious initially with attempt. pt is able to anterior weight shift and step to commode with cueing Toileting- Clothing Manipulation and Hygiene: Total assistance General ADL Comments: Pt noted to have decr BP on arrival with Map 55. Pt assisted to EOB with no compliants. Pt with incr Map . pt transfered to chair with MAP 72 . pt no complaints and reports "this feels so good thank you" Pt tearful in chair and tells therapist how pastor prayed for her this morning and now she is able to transfer to the chair. pt very happy with progress.   Cognition: Cognition Overall Cognitive Status: Within Functional Limits for tasks assessed Orientation Level: Oriented X4 Cognition Arousal/Alertness: Awake/alert Behavior During Therapy: Anxious Overall Cognitive Status: Within Functional Limits for tasks assessed Area of Impairment: Orientation;Attention;Memory;Following  commands;Safety/judgement;Problem solving;Awareness Orientation Level: Disoriented to;Situation (reports fireman dropped her) Current Attention Level: Sustained Memory: Decreased short-term memory Following Commands: Follows multi-step commands with increased time Safety/Judgement: Decreased awareness of safety;Decreased awareness of  deficits Awareness: Emergent Problem Solving: Slow processing;Decreased initiation;Difficulty sequencing General Comments: pt reports fireman dropping patient during transfer. Spouse states "why did she say that? thats not true." pt asked to recount events PTA and never mentioned falling with fireman assistance. OT cueing patient to this fact and pt states "well I remember falling with them" Pt asking if RN phone in hall was personal cell phone ringing. Spouse reports your phone is not here. pt denies this fact and insist phone is present.   Physical Exam: Blood pressure 80/54, pulse 73, temperature 98 F (36.7 C), temperature source Oral, resp. rate 17, height _0  (1.676 m), weight 110 kg (242 lb 8.1 oz), SpO2 97.00%. Physical Exam Constitutional: She is oriented to person, place, and time. obese HENT: oral mucosa pink and moist Head: Normocephalic.  Eyes: EOM are normal.  Neck: Normal range of motion. Neck supple. No thyromegaly present.  Cardiovascular:  Cardiac rate controlled. No murmur  Respiratory: Effort normal and breath sounds normal. No respiratory distress.  GI: Soft. Bowel sounds are normal. She exhibits no distension.  Musculoskeletal:  1+  bilateral LE , bilateral unna dressings in place Neurological: She is alert and oriented to person, place, and time.  Follows full commands. Alert and appropriate. No sensory deficits except for the feet which remain dimnished to light touch. UE's grossly 4 to 4+/5 deltoid, biceps, triceps, HI. LE's 3- HF,   3- KE and 3+/5 ankles. DTR's 1+ Skin:  Bilateral lower extremities with dry dressing in place. Scattered ecchymoses in UE's. A few abrasions areas over toes are visible. Stage 2 gluteal wound (left) with surrounding excoriated skin. Psychiatric: She has a normal mood and affect. Her behavior is normal. Judgment and thought content normal  Results for orders placed during the hospital encounter of 03/16/14 (from the past 48 hour(s))   GLUCOSE, CAPILLARY     Status: Abnormal   Collection Time    03/18/14  7:55 AM      Result Value Ref Range   Glucose-Capillary 171 (*) 70 - 99 mg/dL  BASIC METABOLIC PANEL     Status: Abnormal   Collection Time    03/18/14  9:36 AM      Result Value Ref Range   Sodium 126 (*) 137 - 147 mEq/L   Potassium 2.6 (*) 3.7 - 5.3 mEq/L   Comment: CRITICAL RESULT CALLED TO, READ BACK BY AND VERIFIED WITH:     S.SHAW,RN 03/18/14 1033 BY BSLADE   Chloride 88 (*) 96 - 112 mEq/L   CO2 23  19 - 32 mEq/L   Glucose, Bld 172 (*) 70 - 99 mg/dL   BUN 86 (*) 6 - 23 mg/dL   Creatinine, Ser 1.55 (*) 0.50 - 1.10 mg/dL   Calcium 7.7 (*) 8.4 - 10.5 mg/dL   GFR calc non Af Amer 35 (*) >90 mL/min   GFR calc Af Amer 41 (*) >90 mL/min   Comment: (NOTE)     The eGFR has been calculated using the CKD EPI equation.     This calculation has not been validated in all clinical situations.     eGFR's persistently <90 mL/min signify possible Chronic Kidney     Disease.  GLUCOSE, CAPILLARY     Status: Abnormal   Collection Time    03/18/14 11:32  AM      Result Value Ref Range   Glucose-Capillary 171 (*) 70 - 99 mg/dL  BASIC METABOLIC PANEL     Status: Abnormal   Collection Time    03/18/14  4:00 PM      Result Value Ref Range   Sodium 125 (*) 137 - 147 mEq/L   Potassium 3.0 (*) 3.7 - 5.3 mEq/L   Chloride 90 (*) 96 - 112 mEq/L   CO2 21  19 - 32 mEq/L   Glucose, Bld 176 (*) 70 - 99 mg/dL   BUN 80 (*) 6 - 23 mg/dL   Creatinine, Ser 1.56 (*) 0.50 - 1.10 mg/dL   Calcium 7.4 (*) 8.4 - 10.5 mg/dL   GFR calc non Af Amer 35 (*) >90 mL/min   GFR calc Af Amer 40 (*) >90 mL/min   Comment: (NOTE)     The eGFR has been calculated using the CKD EPI equation.     This calculation has not been validated in all clinical situations.     eGFR's persistently <90 mL/min signify possible Chronic Kidney     Disease.  IRON AND TIBC     Status: Abnormal   Collection Time    03/18/14  4:00 PM      Result Value Ref Range   Iron  17 (*) 42 - 135 ug/dL   TIBC 291  250 - 470 ug/dL   Saturation Ratios 6 (*) 20 - 55 %   UIBC 274  125 - 400 ug/dL   Comment: Performed at [facility]     Status: None   Collection Time    03/18/14  4:00 PM      Result Value Ref Range   Ferritin 18  10 - 291 ng/mL   Comment: Performed at [facility], CAPILLARY     Status: Abnormal   Collection Time    03/18/14  5:15 PM      Result Value Ref Range   Glucose-Capillary 160 (*) 70 - 99 mg/dL   Comment 1 Notify RN    GLUCOSE, CAPILLARY     Status: Abnormal   Collection Time    03/18/14  9:46 PM      Result Value Ref Range   Glucose-Capillary 166 (*) 70 - 99 mg/dL  BASIC METABOLIC PANEL     Status: Abnormal   Collection Time    03/19/14  3:05 AM      Result Value Ref Range   Sodium 123 (*) 137 - 147 mEq/L   Potassium 3.4 (*) 3.7 - 5.3 mEq/L   Comment: HEMOLYSIS AT THIS LEVEL MAY AFFECT RESULT   Chloride 87 (*) 96 - 112 mEq/L   CO2 19  19 - 32 mEq/L   Glucose, Bld 145 (*) 70 - 99 mg/dL   BUN 78 (*) 6 - 23 mg/dL   Creatinine, Ser 1.61 (*) 0.50 - 1.10 mg/dL   Calcium 7.7 (*) 8.4 - 10.5 mg/dL   GFR calc non Af Amer 33 (*) >90 mL/min   GFR calc Af Amer 39 (*) >90 mL/min   Comment: (NOTE)     The eGFR has been calculated using the CKD EPI equation.     This calculation has not been validated in all clinical situations.     eGFR's persistently <90 mL/min signify possible Chronic Kidney     Disease.  PROTIME-INR     Status: Abnormal   Collection Time    03/19/14  3:05  AM      Result Value Ref Range   Prothrombin Time 44.1 (*) 11.6 - 15.2 seconds   INR 4.95 (*) 0.00 - 1.49  VITAMIN B12     Status: None   Collection Time    03/19/14  3:05 AM      Result Value Ref Range   Vitamin B-12 617  211 - 911 pg/mL   Comment: Performed at [facility]     Status: None   Collection Time    03/19/14  3:05 AM      Result Value Ref Range   Folate 6.7     Comment: (NOTE)     Reference  Ranges            Deficient:       0.4 - 3.3 ng/mL            Indeterminate:   3.4 - 5.4 ng/mL            Normal:              > 5.4 ng/mL     Performed at [facility] TIBC     Status: Abnormal   Collection Time    03/19/14  3:05 AM      Result Value Ref Range   Iron <10 (*) 42 - 135 ug/dL   TIBC Not calculated due to Iron <10.  250 - 470 ug/dL   Saturation Ratios Not calculated due to Iron <10.  20 - 55 %   UIBC 275  125 - 400 ug/dL   Comment: Performed at [facility]     Status: None   Collection Time    03/19/14  3:05 AM      Result Value Ref Range   Ferritin 17  10 - 291 ng/mL   Comment: Performed at [facility]  RETICULOCYTES     Status: Abnormal   Collection Time    03/19/14  3:05 AM      Result Value Ref Range   Retic Ct Pct 4.3 (*) 0.4 - 3.1 %   RBC. 3.86 (*) 3.87 - 5.11 MIL/uL   Retic Count, Manual 166.0  19.0 - 186.0 K/uL  CBC     Status: Abnormal   Collection Time    03/19/14  3:05 AM      Result Value Ref Range   WBC 12.1 (*) 4.0 - 10.5 K/uL   RBC 3.86 (*) 3.87 - 5.11 MIL/uL   Hemoglobin 7.9 (*) 12.0 - 15.0 g/dL   HCT 26.4 (*) 36.0 - 46.0 %   MCV 68.4 (*) 78.0 - 100.0 fL   MCH 20.5 (*) 26.0 - 34.0 pg   MCHC 29.9 (*) 30.0 - 36.0 g/dL   RDW 18.9 (*) 11.5 - 15.5 %   Platelets 365  150 - 400 K/uL  GLUCOSE, CAPILLARY     Status: Abnormal   Collection Time    03/19/14  7:34 AM      Result Value Ref Range   Glucose-Capillary 141 (*) 70 - 99 mg/dL  OCCULT BLOOD X 1 CARD TO LAB, STOOL     Status: Abnormal   Collection Time    03/19/14  1:13 PM      Result Value Ref Range   Fecal Occult Bld POSITIVE (*) NEGATIVE  GLUCOSE, CAPILLARY     Status: Abnormal   Collection Time    03/19/14  1:27 PM  Result Value Ref Range   Glucose-Capillary 181 (*) 70 - 99 mg/dL  BASIC METABOLIC PANEL     Status: Abnormal   Collection Time    03/19/14  3:56 PM      Result Value Ref Range   Sodium 125 (*) 137 - 147 mEq/L    Potassium 3.3 (*) 3.7 - 5.3 mEq/L   Chloride 90 (*) 96 - 112 mEq/L   CO2 19  19 - 32 mEq/L   Glucose, Bld 241 (*) 70 - 99 mg/dL   BUN 77 (*) 6 - 23 mg/dL   Creatinine, Ser 1.71 (*) 0.50 - 1.10 mg/dL   Calcium 8.0 (*) 8.4 - 10.5 mg/dL   GFR calc non Af Amer 31 (*) >90 mL/min   GFR calc Af Amer 36 (*) >90 mL/min   Comment: (NOTE)     The eGFR has been calculated using the CKD EPI equation.     This calculation has not been validated in all clinical situations.     eGFR's persistently <90 mL/min signify possible Chronic Kidney     Disease.  GLUCOSE, CAPILLARY     Status: Abnormal   Collection Time    03/19/14  5:03 PM      Result Value Ref Range   Glucose-Capillary 238 (*) 70 - 99 mg/dL  GLUCOSE, CAPILLARY     Status: Abnormal   Collection Time    03/19/14  9:25 PM      Result Value Ref Range   Glucose-Capillary 174 (*) 70 - 99 mg/dL   Comment 1 Notify RN    BASIC METABOLIC PANEL     Status: Abnormal   Collection Time    03/20/14  3:41 AM      Result Value Ref Range   Sodium 126 (*) 137 - 147 mEq/L   Potassium 3.4 (*) 3.7 - 5.3 mEq/L   Chloride 90 (*) 96 - 112 mEq/L   CO2 21  19 - 32 mEq/L   Glucose, Bld 174 (*) 70 - 99 mg/dL   BUN 76 (*) 6 - 23 mg/dL   Creatinine, Ser 1.69 (*) 0.50 - 1.10 mg/dL   Calcium 8.1 (*) 8.4 - 10.5 mg/dL   GFR calc non Af Amer 32 (*) >90 mL/min   GFR calc Af Amer 37 (*) >90 mL/min   Comment: (NOTE)     The eGFR has been calculated using the CKD EPI equation.     This calculation has not been validated in all clinical situations.     eGFR's persistently <90 mL/min signify possible Chronic Kidney     Disease.  PROTIME-INR     Status: Abnormal   Collection Time    03/20/14  3:41 AM      Result Value Ref Range   Prothrombin Time 33.6 (*) 11.6 - 15.2 seconds   INR 3.47 (*) 0.00 - 1.49  CBC     Status: Abnormal   Collection Time    03/20/14  3:41 AM      Result Value Ref Range   WBC 10.7 (*) 4.0 - 10.5 K/uL   RBC 4.06  3.87 - 5.11 MIL/uL    Hemoglobin 8.5 (*) 12.0 - 15.0 g/dL   HCT 28.1 (*) 36.0 - 46.0 %   MCV 69.2 (*) 78.0 - 100.0 fL   MCH 20.9 (*) 26.0 - 34.0 pg   MCHC 30.2  30.0 - 36.0 g/dL   RDW 19.2 (*) 11.5 - 15.5 %   Platelets 368  150 -  400 K/uL   No results found.     Medical Problem List and Plan: 1. Functional deficits secondary to Deconditioning after multi-medical iisssues 2.  DVT Prophylaxis/Anticoagulation: Chronic Coumadin therapy for atrial fibrillation. Monitor for any bleeding episodes 3. Pain Management: Hydrocodone as needed. Monitor with increased mobility 4. Mood/depression: Wellbutrin 300 mg daily, Xanax 0.5 mg twice daily as needed. Provide emotional support 5. Neuropsych: This patient is capable of making decisions on his own behalf. 6. Atrial fibrillation. Continue Coumadin therapy as directed. Cardiac rate control. No chest pain or shortness of breath 7. Hypertension. Lasix 40 mg daily, Lopressor 12.5 mg twice a day. Monitor with increased mobility 8. AKI/hyponatremia/hyperkalemia. Followup per renal services. Labs are normalizing. Repeat chemistries. Creatinine baseline 1.32 9. Acute on chronic anemia. Latest hemoglobin 8.6. 10. Chronic leg ulcers. Empiric doxycycline/Cipro. Routine skin care per [facility] nurse 11. Diabetes mellitus with peripheral neuropathy. Hemoglobin A1c 7.2. Lantus insulin 30 units each bedtime. Check CBGs a.c. and at bedtime. 12. Hyperlipidemia. Zocor 13. MRSA PCR screening positive. Contact precautions  Post Admission Physician Evaluation: 1. Functional deficits secondary  to deconditioning related to multiple medical issues. 2. Patient is admitted to receive collaborative, interdisciplinary care between the physiatrist, rehab nursing staff, and therapy team. 3. Patient's level of medical complexity and substantial therapy needs in context of that medical necessity cannot be provided at a lesser intensity of care such as a SNF. 4. Patient has experienced substantial  functional loss from his/her baseline which was documented above under the "Functional History" and "Functional Status" headings.  Judging by the patient's diagnosis, physical exam, and functional history, the patient has potential for functional progress which will result in measurable gains while on inpatient rehab.  These gains will be of substantial and practical use upon discharge  in facilitating mobility and self-care at the household level. 5. Physiatrist will provide 24 hour management of medical needs as well as oversight of the therapy plan/treatment and provide guidance as appropriate regarding the interaction of the two. 6. 24 hour rehab nursing will assist with bladder management, bowel management, safety, skin/wound care, disease management, medication administration, pain management and patient education  and help integrate therapy concepts, techniques,education, etc. 7. PT will assess and treat for/with: Lower extremity strength, range of motion, stamina, balance, functional mobility, safety, adaptive techniques and equipment, pain mgt, edema control.   Goals are: mod I to supervision. 8. OT will assess and treat for/with: ADL's, functional mobility, safety, upper extremity strength, adaptive techniques and equipment, pain mgt, caregiver education.   Goals are: mod I to supervision. 9. SLP will assess and treat for/with: n/a.  Goals are: n/a. 10. Case Management and Social Worker will assess and treat for psychological issues and discharge planning. 11. Team conference will be held weekly to assess progress toward goals and to determine barriers to discharge. 12. Patient will receive at least 3 hours of therapy per day at least 5 days per week. 13. ELOS: 14-18 days       14. Prognosis:  excellent     Meredith Staggers, MD, [place] [facility]   03/20/2014

## 2014-03-20 NOTE — Progress Notes (Signed)
[place] TEAM 1 - Stepdown/ICU TEAM Progress Note  Robin BaizeJudith M [name] [id number] DOB: 06/03/1951 DOA: 03/16/2014 PCP: Cala BradfordWHITE,[name], MD  Admit HPI / Brief Narrative: 63 y.o. female with PMH of HTN, HLD, A fib on coumadin, IDDM, PAD, and venous insufficiency w/ chronic leg wounds who presented with progressive weakness, and worsening of leg ulcers.  In the ED she was found to have a K+ of 7.2, and a Na+ of 111.  She was given NS, IV insulin + d50, and kayexalate.  She denied focal neurological symptoms, but was fatigued with generalized weakness; no chest pain, no SOB, no cough, no nausea, vomiting or diarrhea.  Records indicate that over 1200cc of urine was obtained when a foley catheter was placed.  The pt was not aware of urinary retention sx.    HPI/Subjective: C/o generalized anxiety.  Also reports trouble sleeping, and overall feeling of weakness.    Assessment/Plan:  Severe hyponatremia Na improved steadily initially w/ hypertonic IV bicarb - on transitioning to NS w/ multiple boluses Na began to drop again - resumed home lasix to encourage free water loss - Nephrology notes reviewed - bicarb initiated - exact etiology remains unclear, but Na appears to be improving again at this time   Severe hyperkalemia >> severe hypokalemia  K+ 7.2 at admit - normalized and now hypokalemic - follow w/o change in tx today   Metabolic acidosis  due to renal failure - improved w/ bicarb gtt - off gtt bicarb slowly declined - follow w/ addition of oral bicarb per Nephrology   Acute kidney injury  BUN of 139, creat of 2.28 at presentation - crt was 1.02 in 2011 - etiology unclear - Nephrology following - renal US w/o acute findings - per Nephrology office note review, pt is likely now at her recent estimated baseline   Sepsis - B leg ulcers w/ ?cellulitis Cont abx - sepsis physiology improving - I am not conviced this contributed significantly to her admit - transitioned to oral abx - follow  clinically - pt is afebrile  DM CBG reasonably controlled - A1c 7.2   Hypotension w/ Hx of HTN Clinically pt no longer appears to be volume depleted   Microcytic anemia Hgb has dropped signif w/ hydration - no evidence of blood loss - stool qiuiac + - Fe level severely low - pt remembers today she had prior pan-endospcopy per Dr. Ewing SchleinMagod and was told to take Fe - Nephrology is loading her w/ IV Fe - have asked that office GI records be faxed to chart for review to determine if f/u endo is indicated   HLD  Chronic Afib on coumadin Rate well controlled   MRSA screen +  Code Status: FULL Family Communication: spoke w/ pt and husband at bedside  Disposition Plan: stable for transfer to tele bed - need to cont to monitor Na, K, and CO2 for stability - ultimate goal is for transfer to CIR for rehab, but insurance company has denied stating pt could be get adequate rehab at a SNF - as the attending MD who is actually caring for this patient I feel this is incorrect and not safe - CIR is the safer option as she will be at high risk for recurring electrolyte abnormalities and rapid volume shifts that will require very close monitoring and could require very prompt transfer back to acute care - [facility] at [phone] to speak to Dr. Roxine CaddyWilliam Lawrence, but received message that the office  was closed for the day   Consultants: Nephrology  Procedures: none  Antibiotics: Vanc 6/13 >>6/15 Zosyn 6/13 >>6/15 Doxycycline 6/15 >>  DVT prophylaxis: warfarin  Objective: Blood pressure 109/47, pulse 72, temperature 98.3 F (36.8 C), temperature source Oral, resp. rate 20, height 5\' 5"  (1.651 m), weight 111.5 kg (245 lb 13 oz), SpO2 98.00%.  Intake/Output Summary (Last 24 hours) at 03/20/14 1726 Last data filed at 03/20/14 1400  Gross per 24 hour  Intake    583 ml  Output    402 ml  Net    181 ml   Exam: General: No acute respiratory distress - alert and oriented  x4 Lungs: Clear to auscultation bilaterally without wheezes or crackles - bs distant  Cardiovascular: Regular rate with irreg rhythm - HS distant - no M  Abdomen: obese, soft, apparent abdom wall herniation in LLQ w/o tenderness on exam, bs+, no rebound  Extremities: multiple wounds B LE w/ signif leakage of straw colored fluid - no signif edema   Data Reviewed: Basic Metabolic Panel:  Recent Labs Lab 03/18/14 0936 03/18/14 1600 03/19/14 0305 03/19/14 1556 03/20/14 0341  NA 126* 125* 123* 125* 126*  K 2.6* 3.0* 3.4* 3.3* 3.4*  CL 88* 90* 87* 90* 90*  CO2 23 21 19 19 21   GLUCOSE 172* 176* 145* 241* 174*  BUN 86* 80* 78* 77* 76*  CREATININE 1.55* 1.56* 1.61* 1.71* 1.69*  CALCIUM 7.7* 7.4* 7.7* 8.0* 8.1*   Liver Function Tests: No results found for this basename: AST, ALT, ALKPHOS, BILITOT, PROT, ALBUMIN,  in the last 168 hours  CBC:  Recent Labs Lab 03/16/14 1123 03/17/14 0342 03/18/14 0440 03/19/14 0305 03/20/14 0341  WBC 21.6* 13.4* 12.4* 12.1* 10.7*  NEUTROABS 20.2*  --   --   --   --   HGB 11.0* 8.8* 8.6* 7.9* 8.5*  HCT 34.8* 28.4* 28.2* 26.4* 28.1*  MCV 65.8* 67.5* 68.1* 68.4* 69.2*  PLT 563* 419* PLATELET CLUMPS NOTED ON SMEAR, COUNT APPEARS ADEQUATE 365 368   CBG:  Recent Labs Lab 03/19/14 2125 03/20/14 0636 03/20/14 0836 03/20/14 1159 03/20/14 1557  GLUCAP 174* 145* 121* 119* 153*    Recent Results (from the past 240 hour(s))  CULTURE, BLOOD (ROUTINE X 2)     Status: None   Collection Time    03/16/14  1:35 PM      Result Value Ref Range Status   Specimen Description BLOOD LEFT HAND   Final   Special Requests BOTTLES DRAWN AEROBIC AND ANAEROBIC 10CC   Final   Culture  Setup Time     Final   Value: 03/16/2014 18:20     Performed at [facility]   Culture     Final   Value:        BLOOD CULTURE RECEIVED NO GROWTH TO DATE CULTURE WILL BE HELD FOR 5 DAYS BEFORE ISSUING A FINAL NEGATIVE REPORT     Performed at [facility]   Report  Status PENDING   Incomplete  CULTURE, BLOOD (ROUTINE X 2)     Status: None   Collection Time    03/16/14  1:45 PM      Result Value Ref Range Status   Specimen Description BLOOD LEFT ARM   Final   Special Requests BOTTLES DRAWN AEROBIC AND ANAEROBIC 10CC   Final   Culture  Setup Time     Final   Value: 03/16/2014 18:20     Performed at [facility]  Culture     Final   Value:        BLOOD CULTURE RECEIVED NO GROWTH TO DATE CULTURE WILL BE HELD FOR 5 DAYS BEFORE ISSUING A FINAL NEGATIVE REPORT     [facility]   Report Status PENDING   Incomplete  MRSA PCR SCREENING     Status: Abnormal   Collection Time    03/16/14  3:20 PM      Result Value Ref Range Status   MRSA by PCR POSITIVE (*) NEGATIVE Final   Comment:            The GeneXpert MRSA Assay (FDA     approved for NASAL specimens     only), is one component of a     comprehensive MRSA colonization     surveillance program. It is not     intended to diagnose MRSA     infection nor to guide or     monitor treatment for     MRSA infections.     RESULT CALLED TO, READ BACK BY AND VERIFIED WITH:     A.AFACSAWO,RN 1719 03/16/14 M.CAMPBELL  WOUND CULTURE     Status: None   Collection Time    03/16/14  3:48 PM      Result Value Ref Range Status   Specimen Description WOUND   Final   Special Requests RIGHT LEG   Final   Gram Stain     Final   Value: RARE WCP RARE SQUAMOUS EPITHELIAL CELLS PRESENT     NO ORGANISMS SEEN     [facility]formed at [facility]   Culture     Final   Value: MODERATE STAPHYLOCOCCUS AUREUS     Note: SUSCEPTIBILITIES PERFORMED ON PREVIOUS CULTURE WITHIN THE LAST 5 DAYS.     Performed at [facility]   Report Status 03/20/2014 FINAL   Final  WOUND CULTURE     Status: None   Collection Time    03/16/14  5:08 PM      Result Value Ref Range Status   Specimen Description WOUND   Final   Special Requests LEFT LEG   Final   Gram Stain     Final   Value: FEW WBC  PRESENT, PREDOMINANTLY PMN     RARE SQUAMOUS EPITHELIAL CELLS PRESENT     FEW GRAM POSITIVE COCCI IN PAIRS     FEW GRAM NEGATIVE COCCI     RARE GRAM NEGATIVE RODS   Culture     Final   Value: MODERATE STAPHYLOCOCCUS AUREUS     Note: RIFAMPIN AND GENTAMICIN SHOULD NOT BE USED AS SINGLE DRUGS FOR TREATMENT OF STAPH INFECTIONS.     Performed at [facility]   Report Status 03/20/2014 FINAL   Final   Organism ID, Bacteria STAPHYLOCOCCUS AUREUS   Final     Studies:  Recent x-ray studies have been reviewed in detail by the Attending Physician  Scheduled Meds:  Scheduled Meds: . buPROPion  300 mg Oral Daily  . Chlorhexidine Gluconate Cloth  6 each Topical Q0600  . doxycycline  100 mg Oral Q12H  . ferumoxytol  510 mg Intravenous Q3 days  . furosemide  40 mg Oral Daily  . insulin aspart  0-9 Units Subcutaneous TID WC  . insulin glargine  30 Units Subcutaneous QHS  . metoprolol tartrate  12.5 mg Oral BID  . mupirocin ointment  1 application Nasal BID  . simvastatin  20 mg Oral q1800  .  sodium bicarbonate  650 mg Oral TID  . sodium chloride  3 mL Intravenous Q12H  . warfarin  2 mg Oral ONCE-1800  . Warfarin - Pharmacist Dosing Inpatient   Does not apply q1800    Time spent on care of this patient: 35 mins   [facility] T , MD   [facility] Office  [phone] Pager - Text Page per Amion as per below:  On-Call/Text Page:      [email or web address]      password [email or web address]  If 7PM-7AM, please contact night-coverage [email or web address] Password [email or web address] 03/20/2014, 5:26 PM   LOS: 4 days

## 2014-03-20 NOTE — Progress Notes (Signed)
ANTICOAGULATION CONSULT NOTE - Follow Up Consult  Pharmacy Consult for Coumadin Indication: atrial fibrillation  No Known Allergies  Patient Measurements: Height: 5\' 6"  (167.6 cm) Weight: 242 lb 8.1 oz (110 kg) IBW/kg (Calculated) : 59.3 Heparin Dosing Weight:   Vital Signs: Temp: 97.7 F (36.5 C) (06/17 0800) Temp src: Oral (06/17 0800) BP: 121/58 mmHg (06/17 0800) Pulse Rate: 62 (06/17 0800)  Labs:  Recent Labs  03/18/14 0440  03/19/14 0305 03/19/14 1556 03/20/14 0341  HGB 8.6*  --  7.9*  --  8.5*  HCT 28.2*  --  26.4*  --  28.1*  PLT PLATELET CLUMPS NOTED ON SMEAR, COUNT APPEARS ADEQUATE  --  365  --  368  LABPROT 41.0*  --  44.1*  --  33.6*  INR 4.50*  --  4.95*  --  3.47*  CREATININE 1.58*  < > 1.61* 1.71* 1.69*  < > = values in this interval not displayed.  Estimated Creatinine Clearance: 43.9 ml/min (by C-G formula based on Cr of 1.69).  Assessment: 62yof on Coumadin for Afib. INR (3.47) is supratherapeutic but has trended down from 4.95 overnight - expect INR to continue to decrease with held doses x 2. Spoke with [facility] concerning plan with fecal occult positive - OK per Dr. Sharon SellerMcClung to restart reduced dose Coumadin tonight.  - H/H improving, Plts wnl - No significant bleeding reported (FOB positive - see above) - PTA regimen: 5mg  daily  Goal of Therapy:  INR 2-3 Monitor platelets by anticoagulation protocol: Yes   Plan:  1. Coumadin 2mg  po x 1 today (ok with Dr. Sharon SellerMcClung) 2. F/U AM INR   Cleon DewDulaney, Perkins Robert [phone] 03/20/2014,9:59 AM

## 2014-03-20 NOTE — Progress Notes (Signed)
Transferred pt to [facility] room 15. Pt alert and oriented x 4 and in no distress on departure. Notified central telemetry. Notified and spoke with [facility] [place] nurse taking over pt's care.

## 2014-03-21 DIAGNOSIS — L02419 Cutaneous abscess of limb, unspecified: Secondary | ICD-10-CM

## 2014-03-21 DIAGNOSIS — E875 Hyperkalemia: Secondary | ICD-10-CM

## 2014-03-21 DIAGNOSIS — L03119 Cellulitis of unspecified part of limb: Secondary | ICD-10-CM

## 2014-03-21 DIAGNOSIS — I4891 Unspecified atrial fibrillation: Secondary | ICD-10-CM

## 2014-03-21 DIAGNOSIS — L97929 Non-pressure chronic ulcer of unspecified part of left lower leg with unspecified severity: Secondary | ICD-10-CM

## 2014-03-21 DIAGNOSIS — L97919 Non-pressure chronic ulcer of unspecified part of right lower leg with unspecified severity: Secondary | ICD-10-CM | POA: Diagnosis present

## 2014-03-21 LAB — BASIC METABOLIC PANEL
BUN: 70 mg/dL — ABNORMAL HIGH (ref 6–23)
CHLORIDE: 93 meq/L — AB (ref 96–112)
CO2: 21 meq/L (ref 19–32)
Calcium: 8.4 mg/dL (ref 8.4–10.5)
Creatinine, Ser: 1.5 mg/dL — ABNORMAL HIGH (ref 0.50–1.10)
GFR calc Af Amer: 42 mL/min — ABNORMAL LOW (ref >90)
GFR calc non Af Amer: 36 mL/min — ABNORMAL LOW (ref >90)
GLUCOSE: 109 mg/dL — AB (ref 70–99)
POTASSIUM: 3.3 meq/L — AB (ref 3.7–5.3)
Sodium: 130 mEq/L — ABNORMAL LOW (ref 137–147)

## 2014-03-21 LAB — PROTIME-INR
INR: 2.44 — ABNORMAL HIGH (ref 0.00–1.49)
Prothrombin Time: 25.7 seconds — ABNORMAL HIGH (ref 11.6–15.2)

## 2014-03-21 LAB — CBC
HEMATOCRIT: 28.1 % — AB (ref 36.0–46.0)
Hemoglobin: 8.2 g/dL — ABNORMAL LOW (ref 12.0–15.0)
MCH: 20.6 pg — AB (ref 26.0–34.0)
MCHC: 29.2 g/dL — ABNORMAL LOW (ref 30.0–36.0)
MCV: 70.6 fL — AB (ref 78.0–100.0)
Platelets: 337 10*3/uL (ref 150–400)
RBC: 3.98 MIL/uL (ref 3.87–5.11)
RDW: 19.5 % — ABNORMAL HIGH (ref 11.5–15.5)
WBC: 13.2 10*3/uL — ABNORMAL HIGH (ref 4.0–10.5)

## 2014-03-21 LAB — GLUCOSE, CAPILLARY
GLUCOSE-CAPILLARY: 146 mg/dL — AB (ref 70–99)
GLUCOSE-CAPILLARY: 134 mg/dL — AB (ref 70–99)
Glucose-Capillary: 123 mg/dL — ABNORMAL HIGH (ref 70–99)
Glucose-Capillary: 163 mg/dL — ABNORMAL HIGH (ref 70–99)

## 2014-03-21 MED ORDER — CIPROFLOXACIN HCL 500 MG PO TABS
500.0000 mg | ORAL_TABLET | Freq: Two times a day (BID) | ORAL | Status: DC
  Administered 2014-03-21 – 2014-03-22 (×3): 500 mg via ORAL
  Filled 2014-03-21 (×5): qty 1

## 2014-03-21 MED ORDER — WARFARIN SODIUM 4 MG PO TABS
4.0000 mg | ORAL_TABLET | Freq: Once | ORAL | Status: DC
  Administered 2014-03-21: 4 mg via ORAL
  Filled 2014-03-21: qty 1

## 2014-03-21 MED ORDER — POTASSIUM CHLORIDE CRYS ER 20 MEQ PO TBCR
40.0000 meq | EXTENDED_RELEASE_TABLET | Freq: Once | ORAL | Status: AC
  Administered 2014-03-21: 40 meq via ORAL
  Filled 2014-03-21: qty 2

## 2014-03-21 NOTE — Clinical Social Work Note (Signed)
CSW talked with patient and husband earlier today regarding discharge options: Inpatient rehab versus SNF. In progression it was mentioned that patient may be willing to pay out of pocket for CIR. [facility] and information obtained regarding costs for CIR. This information was shared with patient and husband as well as process for SNF search and needing decision by this afternoon. Vernell MorgansB. Boyette contacted and advised that patient and husband would like to talk with her. See B. Boyette's note regarding her conversation with patient and husband.  Genelle BalVanessa Crawford, MSW, LCSW [phone]

## 2014-03-21 NOTE — Consult Note (Addendum)
WOC wound follow up Wound type:Venous stasis ulcerations, chronic.  Patient seen by me 4 days ago at which time I implemented a conservative POC for the lwoer extremities (LEs).  Asked today to see again for LEs.  As patient has worn bilateral Unna's Boots in the past, we will implement this therapy.  She has had chronic ulcerations for 3.5 years and [facility]ier[facility].  PAtient states that hosiery was too difficult to put on and stopped wearing them.  Patient and spouse are educated today about letting the MDs know when a POC is not working and seeking additional suggestions. I have told her about compression hosiery that has a zipper in the back which many people find easier to apply.  For now, once weekly Unna's boot changes are indicated and patient will need to be [facility] or a rehab center for continued changes/observation of her LEs. Measurement:Per 03/17/14 Wound ZOX:WRUEAbed:Clean, pink, moist Drainage (amount, consistency, odor) Scant serous Periwound: hemosiderin staining, numerous full and partial thickness wounds and scarred areas that indicated where previous wounds have healed. Dressing procedure/placement/frequency:Pertolatum gauze in a single layer is applied over the open areas. Ortho tech is to apply bilateral Unna's boots and maintain with once weekly changes.  Prevalon Heel boots are for pressure ulcer prevention and should be worn per protocol. WOC nursing team will not follow, but will remain available to this patient, the nursing and medical teams.  Please re-consult if needed. Thanks, Ladona MowLaurie McNichol, MSN, RN, GNP, [place], CWON-AP [phone])

## 2014-03-21 NOTE — Progress Notes (Signed)
Physical Therapy Treatment Patient Details Name: Robin BaizeJudith M [name] MRN: [id number] DOB: 07/16/1951 Today's Date: 03/21/2014    History of Present Illness Pt admit with bil LE cellulitis.  Afib.  Pt. with severe hyponatremia now improving.  Pt. initially with hyperkalemia, now hypokalemic.  AKI with generalized weakness.      PT Comments    Pt. Tolerated a 53 minute PT session this afternoon which included bed mobility, bed exercises, standing tasks ,  walking and seated exercises.   She was  able to participate in all requests.  She needed verbal cues and manual guidance at times but was able to walk 15 feet x 2 with RW .  Overall good progress today and seems prepared to tolerate IP rehab .  Processing seems slightly delayed.   Follow Up Recommendations  CIR;Supervision/Assistance - 24 hour     Equipment Recommendations  Other (comment) (TBD)    Recommendations for Other Services       Precautions / Restrictions Precautions Precautions: Fall Restrictions Weight Bearing Restrictions: No    Mobility  Bed Mobility Overal bed mobility: Needs Assistance Bed Mobility: Rolling;Sidelying to Sit Rolling: Min assist Sidelying to sit: Min assist       General bed mobility comments: Pt. required step by step cueing for shifting of hips to back side of bed to allow rolling to right side of bed.  Pt. was able to roll right with use of bed rail and min assist.  Pt. moved side to sit with cueing and min assist to transition to upright.    Transfers Overall transfer level: Needs assistance Equipment used: Rolling walker (2 wheeled) Transfers: Sit to/from Stand Sit to Stand: +2 physical assistance;Max assist         General transfer comment: Pt. able to tolerate 4 trials of standing from seated position, once from bed and 3 x from recliner chair.  Pt. needed to rock forward to gain momentum and required 2 max to mod assist to translate to standing  position.  Ambulation/Gait Ambulation/Gait assistance: Mod assist;+2 physical assistance Ambulation Distance (Feet): 30 Feet (15 x 2 with seated rest) Assistive device: Rolling walker (2 wheeled) Gait Pattern/deviations: Step-to pattern;Decreased step length - right;Decreased step length - left Gait velocity: decreased   General Gait Details: Pt. needed assist to advance RW  and verbal cues to step through with bilaterally.  She also needed prompting to increase step length especially on right LE   Stairs            Wheelchair Mobility    Modified Rankin (Stroke Patients Only)       Balance   Sitting-balance support: No upper extremity supported;Feet supported Sitting balance-Leahy Scale: Good Sitting balance - Comments: Pt. able to maintain sitting at EOB for 10 minutes and completed LE exercises while sitting.     Standing balance support: Bilateral upper extremity supported;During functional activity Standing balance-Leahy Scale: Poor Standing balance comment: relies on rolling walker to maintain standing upright .  No buckling of knees in standing                    Cognition Arousal/Alertness: Awake/alert Behavior During Therapy: Anxious (anxious to [facility] for tasks given to pt.  Growing confidence/) Overall Cognitive Status: Impaired/Different from baseline Area of Impairment: Orientation;Attention;Memory;Following commands;Safety/judgement;Problem solving;Awareness   Current Attention Level: Sustained Memory: Decreased short-term memory Following Commands: Follows multi-step commands with increased time Safety/Judgement: Decreased awareness of deficits Awareness: Emergent Problem Solving: Slow processing General Comments: Pt. needs  manual, demo  and verbal cueing for task completion. Pt. able to recall when I asked her to remember what command I had just given to her.      Exercises General Exercises - Lower Extremity Ankle Circles/Pumps: AROM;Both;15  reps;Supine Quad Sets: AROM;Both;15 reps;Supine Short Arc Quad: AROM;Both;10 reps;Supine Long Arc Quad: AROM;Both;10 reps;Seated Heel Slides: AROM;Both;10 reps;Supine Straight Leg Raises: AROM;Both;5 reps;Supine    General Comments        Pertinent Vitals/Pain See vitals tab  No pain, no distress, no SOB noted    Home Living                      Prior Function            PT Goals (current goals can now be found in the care plan section) Progress towards PT goals: Progressing toward goals    Frequency  Min 3X/week    PT Plan Current plan remains appropriate    Co-evaluation             End of Session Equipment Utilized During Treatment: Gait belt Activity Tolerance: Patient tolerated treatment well Patient left: in chair;with call bell/phone within reach;with chair alarm set     Time: 1441-1532 PT Time Calculation (min): 51 min  Charges:  $Gait Training: 23-37 mins $Therapeutic Exercise: 8-22 mins                    G Codes:      Ferman HammingBlankenship, Susan B 03/21/2014, 3:56 PM Weldon PickingSusan Blankenship PT Acute Rehab Services [phone] [place] [phone]

## 2014-03-21 NOTE — Progress Notes (Addendum)
Patient ID: Robin [name]  female  HYQ:[id number]    DOB: 1951/05/28    DOA: 03/16/2014  PCP: [name], MD  Assessment/Plan: Principal Problem:  Severe hyponatremia  -Sodium now improving, 130 today, nephrology following  Severe hyperkalemia >> severe hypokalemia  K+ 7.2 at admit - normalized and now hypokalemic, replaced today  Metabolic acidosis  due to renal failure - improved  with bicarb drip, off bicarb drip, slowly declined hence placed on oral bicarb by nephrology  Acute kidney injury  - BUN of 139, creat of 2.28 at presentation - crt was 1.02 in 2011 - etiology unclear - Nephrology following, renal ultrasound did not show any acute hydronephrosis or obstruction  - per Nephrology office note review, pt is likely now at her recent estimated baseline   Sepsis - B leg ulcers w/ ?cellulitis  Cont abx - sepsis physiology improving,transition to oral doxycycline , added ciprofloxacin - wound care consult placed  DM  CBG reasonably controlled - A1c 7.2   Hypotension w/ Hx of HTN  Clinically pt no longer appears to be volume depleted   Microcytic anemia  - Hgb has dropped with hydration - no evidence of blood loss - stool qiuiac +  - Fe level severely low, patient remembered that she had endoscopy per Dr. Ewing Schlein and was told to take Fe  - Nephrology giving IV iron, follow up outpatient with Dr. Ewing Schlein if repeat EGD is indicated    DVT Prophylaxis:  Code Status:  Family Communication: discussed in detail with the patient   Disposition: patient requesting for inpatient rehabilitation. I discussed with Dr. Lyman Bishop from [facility], Peer to peer review, he requested for repeat PT evaluation today, and they will review the notes of the evaluation if she will be a candidate for CIR or not.  Consultants:  Nephrology   Procedures:  None   Antibiotics:  Doxycycline    ciprofloxacin   Subjective: Patient seen and examined, no acute complaints    Objective: Weight change: 1.5 kg (3 lb 4.9 oz)  Intake/Output Summary (Last 24 hours) at 03/21/14 1314 Last data filed at 03/21/14 0921  Gross per 24 hour  Intake    250 ml  Output      0 ml  Net    250 ml   Blood pressure 121/49, pulse 68, temperature 98 F (36.7 C), temperature source Oral, resp. rate 18, height 5\' 5"  (1.651 m), weight 111.5 kg (245 lb 13 oz), SpO2 100.00%.  Physical Exam: General: Alert and awake, oriented x3, not in any acute distress. HEENT: anicteric sclera, PERLA, EOMI CVS: S1-S2 clear, no murmur rubs or gallops Chest: clear to auscultation bilaterally, no wheezing, rales or rhonchi Abdomen: soft nontender, nondistended, normal bowel sounds  Extremities: bilateral lower extremity ulcers in different stages with granulation tissue and sloughing  Neuro: Cranial nerves II-XII intact, no focal neurological deficits  Lab Results: Basic Metabolic Panel:  Recent Labs Lab 03/20/14 0341 03/21/14 0517  NA 126* 130*  K 3.4* 3.3*  CL 90* 93*  CO2 21 21  GLUCOSE 174* 109*  BUN 76* 70*  CREATININE 1.69* 1.50*  CALCIUM 8.1* 8.4   Liver Function Tests: No results found for this basename: AST, ALT, ALKPHOS, BILITOT, PROT, ALBUMIN,  in the last 168 hours No results found for this basename: LIPASE, AMYLASE,  in the last 168 hours No results found for this basename: AMMONIA,  in the last 168 hours CBC:  Recent Labs Lab 03/16/14 1123  03/20/14 0341 03/21/14 0517  WBC 21.6*  < > 10.7* 13.2*  NEUTROABS 20.2*  --   --   --   HGB 11.0*  < > 8.5* 8.2*  HCT 34.8*  < > 28.1* 28.1*  MCV 65.8*  < > 69.2* 70.6*  PLT 563*  < > 368 337  < > = values in this interval not displayed. Cardiac Enzymes: No results found for this basename: CKTOTAL, CKMB, CKMBINDEX, TROPONINI,  in the last 168 hours BNP: No components found with this basename: POCBNP,  CBG:  Recent Labs Lab 03/20/14 1159 03/20/14 1557 03/20/14 2234 03/21/14 0748 03/21/14 1221  GLUCAP 119* 153*  136* 123* 146*     Micro Results: Recent Results (from the past 240 hour(s))  CULTURE, BLOOD (ROUTINE X 2)     Status: None   Collection Time    03/16/14  1:35 PM      Result Value Ref Range Status   Specimen Description BLOOD LEFT HAND   Final   Special Requests BOTTLES DRAWN AEROBIC AND ANAEROBIC 10CC   Final   Culture  Setup Time     Final   Value: 03/16/2014 18:20     Performed at [facility]   Culture     Final   Value:        BLOOD CULTURE RECEIVED NO GROWTH TO DATE CULTURE WILL BE HELD FOR 5 DAYS BEFORE ISSUING A FINAL NEGATIVE REPORT     Performed at [facility]   Report Status PENDING   Incomplete  CULTURE, BLOOD (ROUTINE X 2)     Status: None   Collection Time    03/16/14  1:45 PM      Result Value Ref Range Status   Specimen Description BLOOD LEFT ARM   Final   Special Requests BOTTLES DRAWN AEROBIC AND ANAEROBIC 10CC   Final   Culture  Setup Time     Final   Value: 03/16/2014 18:20     Performed at [facility]   Culture     Final   Value:        BLOOD CULTURE RECEIVED NO GROWTH TO DATE CULTURE WILL BE HELD FOR 5 DAYS BEFORE ISSUING A FINAL NEGATIVE REPORT     Performed at [facility]   Report Status PENDING   Incomplete  MRSA PCR SCREENING     Status: Abnormal   Collection Time    03/16/14  3:20 PM      Result Value Ref Range Status   MRSA by PCR POSITIVE (*) NEGATIVE Final   Comment:            The GeneXpert MRSA Assay (FDA     approved for NASAL specimens     only), is one component of a     comprehensive MRSA colonization     surveillance program. It is not     intended to diagnose MRSA     infection nor to guide or     monitor treatment for     MRSA infections.     RESULT CALLED TO, READ BACK BY AND VERIFIED WITH:     A.[email or web address],RN [id number] 03/16/14 M.CAMPBELL  WOUND CULTURE     Status: None   Collection Time    03/16/14  3:48 PM      Result Value Ref Range Status   Specimen Description WOUND   Final   Special  Requests RIGHT LEG   Final   Gram Stain  Final   Value: RARE WCP RARE SQUAMOUS EPITHELIAL CELLS PRESENT     NO ORGANISMS SEEN     [facility]   Culture     Final   Value: MODERATE STAPHYLOCOCCUS AUREUS     Note: SUSCEPTIBILITIES PERFORMED ON PREVIOUS CULTURE WITHIN THE LAST 5 DAYS.     Performed at [facility]   Report Status 03/20/2014 FINAL   Final  WOUND CULTURE     Status: None   Collection Time    03/16/14  5:08 PM      Result Value Ref Range Status   Specimen Description WOUND   Final   Special Requests LEFT LEG   Final   Gram Stain     Final   Value: FEW WBC PRESENT, PREDOMINANTLY PMN     RARE SQUAMOUS EPITHELIAL CELLS PRESENT     FEW GRAM POSITIVE COCCI IN PAIRS     FEW GRAM NEGATIVE COCCI     RARE GRAM NEGATIVE RODS   Culture     Final   Value: MODERATE STAPHYLOCOCCUS AUREUS     Note: RIFAMPIN AND GENTAMICIN SHOULD NOT BE USED AS SINGLE DRUGS FOR TREATMENT OF STAPH INFECTIONS.     Performed at [facility]   Report Status 03/20/2014 FINAL   Final   Organism ID, Bacteria STAPHYLOCOCCUS AUREUS   Final    Studies/Results: Dg Chest 1 View  03/16/2014   CLINICAL DATA:  Diabetes.  EXAM: CHEST - 1 VIEW  COMPARISON:  None.  FINDINGS: Mediastinum and hilar structures normal. Mild cardiomegaly with normal pulmonary vascularity. Bibasilar subsegmental atelectasis present. Small bilateral pleural effusions cannot be excluded. No acute bony abnormality .  IMPRESSION: 1. Low lung volumes with mild bibasilar atelectasis. Small pleural effusions cannot be excluded.  2. Mild cardiomegaly, no significant pulmonary venous congestion.   Electronically Signed   By: Maisie Fushomas  Register   On: 03/16/2014 18:29   [place] Renal  03/16/2014   CLINICAL DATA:  Acute renal failure.  EXAM: RENAL/URINARY TRACT ULTRASOUND COMPLETE  COMPARISON:  None.  FINDINGS: Right Kidney:  Length: 13.0 cm. Cortical thinning. Right kidney is slightly echogenic suggesting chronic medical  renal disease.  Left Kidney:  Length: 12.1 cm. Cortical thinning. Left kidney is echogenic suggesting chronic medical renal disease.  Bladder:  Bladder is slightly distended. Patient unable to void. Bladder volume is 814.9 cc.  This was a limited exam due to patient's body habitus and patient's inability to turn.  IMPRESSION: 1. Renal cortical thinning and increased echogenicity suggesting chronic medical renal disease. No hydronephrosis. 2. Mild bladder distention.  Patient unable to void.   Electronically Signed   By: Maisie Fushomas  Register   On: 03/16/2014 18:10   Dg Foot 2 Views Left  03/16/2014   CLINICAL DATA:  Ulcers.  Rule out osteomyelitis.  EXAM: LEFT FOOT - 2 VIEW  COMPARISON:  None.  FINDINGS: Plantar calcaneal spur. Diffuse vascular calcifications. No acute bony abnormality. No radiographic changes of osteomyelitis. No fracture, subluxation or dislocation.  IMPRESSION: No acute bony abnormality.   Electronically Signed   By: Charlett NoseKevin  Dover M.D.   On: 03/16/2014 18:32   Dg Foot 2 Views Right  03/16/2014   CLINICAL DATA:  Soft tissue ulcers.  EXAM: RIGHT FOOT - 2 VIEW  COMPARISON:  None.  FINDINGS: Mild soft tissue swelling is present. No radiopaque foreign bodies. No acute bony or joint abnormalities. Calcification within the the plantar fascia.Vascular calcification noted. A lucency is noted base of the  right third metacarpal. An active process including osteomyelitis cannot be excluded.  IMPRESSION: 1. Focal lucency is seen at the base of the right third metatarsal. Although this may be old an active bone lesion including osteomyelitis cannot be excluded. No acute abnormality otherwise noted.  2. Peripheral vascular calcification indicating peripheral vascular disease.   Electronically Signed   By: Maisie Fushomas  Register   On: 03/16/2014 18:34    Medications: Scheduled Meds: . buPROPion  300 mg Oral Daily  . ciprofloxacin  500 mg Oral BID  . doxycycline  100 mg Oral Q12H  . ferumoxytol  510 mg  Intravenous Q3 days  . furosemide  40 mg Oral Daily  . insulin aspart  0-9 Units Subcutaneous TID WC  . insulin glargine  30 Units Subcutaneous QHS  . metoprolol tartrate  12.5 mg Oral BID  . mupirocin ointment  1 application Nasal BID  . simvastatin  20 mg Oral q1800  . sodium bicarbonate  650 mg Oral TID  . sodium chloride  3 mL Intravenous Q12H  . warfarin  4 mg Oral ONCE-1800  . Warfarin - Pharmacist Dosing Inpatient   Does not apply q1800      LOS: 5 days   RAI,RIPUDEEP M.D. [facility] 03/21/2014, 1:14 PM Pager: [phone]  If 7PM-7AM, please contact night-coverage [email or web address] Password [email or web address]  **Disclaimer: This note was dictated with voice recognition software. Similar sounding words can inadvertently be transcribed and this note may contain transcription errors which may not have been corrected upon publication of note.**

## 2014-03-21 NOTE — Progress Notes (Signed)
ANTICOAGULATION CONSULT NOTE - Follow Up Consult  Pharmacy Consult for Coumadin Indication: atrial fibrillation  No Known Allergies  Patient Measurements: Height: 5\' 5"  (165.1 cm) Weight: 245 lb 13 oz (111.5 kg) IBW/kg (Calculated) : 57  Vital Signs: Temp: 98 F (36.7 C) (06/18 0751) Temp src: Oral (06/18 0751) BP: 121/49 mmHg (06/18 0751) Pulse Rate: 68 (06/18 0751)  Labs:  Recent Labs  03/19/14 0305 03/19/14 1556 03/20/14 0341 03/21/14 0517  HGB 7.9*  --  8.5* 8.2*  HCT 26.4*  --  28.1* 28.1*  PLT 365  --  368 337  LABPROT 44.1*  --  33.6* 25.7*  INR 4.95*  --  3.47* 2.44*  CREATININE 1.61* 1.71* 1.69* 1.50*    Estimated Creatinine Clearance: 49 ml/min (by C-G formula based on Cr of 1.5).  Assessment: [name] on Coumadin for Afib. INR now trending down after doses held x 2 days.  Pharmac[facility] concerning plan with fecal occult positive - OK per Dr. Sharon SellerMcClung to restart reduced dose Coumadin tonight.   - H/H improving, Plts wnl - No significant bleeding reported (FOB positive - see above) - PTA regimen: 5mg  daily - Monitor for drug interaction with Cipro and Doxy  Goal of Therapy:  INR 2-3 Monitor platelets by anticoagulation protocol: Yes   Plan:  1. Coumadin 4 mg po x 1 today  2. F/U AM INR   Toys 'R' UsKimberly Katiya Fike, Pharm.D., BCPS Clinical Pharmacist Pager [phone] 03/21/2014 10:34 AM

## 2014-03-21 NOTE — Progress Notes (Signed)
Orthopedic Tech Progress Note Patient Details:  Robin BaizeJudith M [name] 12/14/1950 [id number]  Ortho Devices Type of Ortho Device: Roland RackUnna boot Ortho Device/Splint Location: Bilateral unna boots Ortho Device/Splint Interventions: Application   Shawnie PonsCammer, Izamar Linden Carol 03/21/2014, 2:17 PM

## 2014-03-21 NOTE — Progress Notes (Signed)
I have sent updated clinicals as well as therapy progress to appeal for inpt rehab admission tomorrow. Pt and her spouse are aware. I will follow up tomorrow. [phone]

## 2014-03-21 NOTE — Progress Notes (Signed)
I met with pt and her spouse at bedside to discuss options for rehab if [employer] denies admission. Husband is interested in paying out of pocket. I will assess cost with inpt rehab Director and notify husband of estimated cost of care. I spoke with Dr. Tana Coast concerning her peer to peer discussion with [employer] this afternoon. I have requested PT updated progress notes so that I can provide updates to [facility]. [phone]

## 2014-03-22 ENCOUNTER — Encounter (HOSPITAL_COMMUNITY): Payer: Self-pay | Admitting: *Deleted

## 2014-03-22 ENCOUNTER — Inpatient Hospital Stay (HOSPITAL_COMMUNITY)
Admission: RE | Admit: 2014-03-22 | Discharge: 2014-04-06 | DRG: 945 | Disposition: A | Discharge status: Home Health | Payer: BC Managed Care – PPO | Source: Intra-hospital | Attending: Physical Medicine & Rehabilitation | Admitting: Physical Medicine & Rehabilitation

## 2014-03-22 DIAGNOSIS — E1142 Type 2 diabetes mellitus with diabetic polyneuropathy: Secondary | ICD-10-CM

## 2014-03-22 DIAGNOSIS — L97929 Non-pressure chronic ulcer of unspecified part of left lower leg with unspecified severity: Secondary | ICD-10-CM

## 2014-03-22 DIAGNOSIS — F329 Major depressive disorder, single episode, unspecified: Secondary | ICD-10-CM

## 2014-03-22 DIAGNOSIS — D649 Anemia, unspecified: Secondary | ICD-10-CM

## 2014-03-22 DIAGNOSIS — I739 Peripheral vascular disease, unspecified: Secondary | ICD-10-CM

## 2014-03-22 DIAGNOSIS — I482 Chronic atrial fibrillation, unspecified: Secondary | ICD-10-CM

## 2014-03-22 DIAGNOSIS — Z794 Long term (current) use of insulin: Secondary | ICD-10-CM

## 2014-03-22 DIAGNOSIS — R5381 Other malaise: Secondary | ICD-10-CM

## 2014-03-22 DIAGNOSIS — L03119 Cellulitis of unspecified part of limb: Secondary | ICD-10-CM

## 2014-03-22 DIAGNOSIS — E669 Obesity, unspecified: Secondary | ICD-10-CM

## 2014-03-22 DIAGNOSIS — A419 Sepsis, unspecified organism: Secondary | ICD-10-CM

## 2014-03-22 DIAGNOSIS — Z7901 Long term (current) use of anticoagulants: Secondary | ICD-10-CM

## 2014-03-22 DIAGNOSIS — L02419 Cutaneous abscess of limb, unspecified: Secondary | ICD-10-CM

## 2014-03-22 DIAGNOSIS — E1110 Type 2 diabetes mellitus with ketoacidosis without coma: Secondary | ICD-10-CM

## 2014-03-22 DIAGNOSIS — I10 Essential (primary) hypertension: Secondary | ICD-10-CM

## 2014-03-22 DIAGNOSIS — G4730 Sleep apnea, unspecified: Secondary | ICD-10-CM

## 2014-03-22 DIAGNOSIS — N179 Acute kidney failure, unspecified: Secondary | ICD-10-CM

## 2014-03-22 DIAGNOSIS — L97909 Non-pressure chronic ulcer of unspecified part of unspecified lower leg with unspecified severity: Secondary | ICD-10-CM

## 2014-03-22 DIAGNOSIS — L259 Unspecified contact dermatitis, unspecified cause: Secondary | ICD-10-CM

## 2014-03-22 DIAGNOSIS — Z5189 Encounter for other specified aftercare: Principal | ICD-10-CM

## 2014-03-22 DIAGNOSIS — E875 Hyperkalemia: Secondary | ICD-10-CM

## 2014-03-22 DIAGNOSIS — R6520 Severe sepsis without septic shock: Secondary | ICD-10-CM

## 2014-03-22 DIAGNOSIS — F3289 Other specified depressive episodes: Secondary | ICD-10-CM

## 2014-03-22 DIAGNOSIS — I4891 Unspecified atrial fibrillation: Secondary | ICD-10-CM

## 2014-03-22 DIAGNOSIS — Z22322 Carrier or suspected carrier of Methicillin resistant Staphylococcus aureus: Secondary | ICD-10-CM

## 2014-03-22 DIAGNOSIS — F411 Generalized anxiety disorder: Secondary | ICD-10-CM

## 2014-03-22 DIAGNOSIS — Z79899 Other long term (current) drug therapy: Secondary | ICD-10-CM

## 2014-03-22 DIAGNOSIS — L97919 Non-pressure chronic ulcer of unspecified part of right lower leg with unspecified severity: Secondary | ICD-10-CM

## 2014-03-22 DIAGNOSIS — I2510 Atherosclerotic heart disease of native coronary artery without angina pectoris: Secondary | ICD-10-CM

## 2014-03-22 DIAGNOSIS — Z8249 Family history of ischemic heart disease and other diseases of the circulatory system: Secondary | ICD-10-CM

## 2014-03-22 DIAGNOSIS — E871 Hypo-osmolality and hyponatremia: Secondary | ICD-10-CM

## 2014-03-22 DIAGNOSIS — Z6841 Body Mass Index (BMI) 40.0 and over, adult: Secondary | ICD-10-CM

## 2014-03-22 DIAGNOSIS — E1149 Type 2 diabetes mellitus with other diabetic neurological complication: Secondary | ICD-10-CM

## 2014-03-22 DIAGNOSIS — E785 Hyperlipidemia, unspecified: Secondary | ICD-10-CM

## 2014-03-22 LAB — BASIC METABOLIC PANEL
BUN: 65 mg/dL — ABNORMAL HIGH (ref 6–23)
CALCIUM: 8.4 mg/dL (ref 8.4–10.5)
CO2: 22 mEq/L (ref 19–32)
Chloride: 93 mEq/L — ABNORMAL LOW (ref 96–112)
Creatinine, Ser: 1.55 mg/dL — ABNORMAL HIGH (ref 0.50–1.10)
GFR calc Af Amer: 41 mL/min — ABNORMAL LOW (ref >90)
GFR, EST NON AFRICAN AMERICAN: 35 mL/min — AB (ref >90)
GLUCOSE: 120 mg/dL — AB (ref 70–99)
Potassium: 4 mEq/L (ref 3.7–5.3)
SODIUM: 130 meq/L — AB (ref 137–147)

## 2014-03-22 LAB — CBC
HCT: 30 % — ABNORMAL LOW (ref 36.0–46.0)
Hemoglobin: 8.6 g/dL — ABNORMAL LOW (ref 12.0–15.0)
MCH: 20.7 pg — ABNORMAL LOW (ref 26.0–34.0)
MCHC: 28.7 g/dL — ABNORMAL LOW (ref 30.0–36.0)
MCV: 72.1 fL — ABNORMAL LOW (ref 78.0–100.0)
PLATELETS: 361 10*3/uL (ref 150–400)
RBC: 4.16 MIL/uL (ref 3.87–5.11)
RDW: 19.9 % — ABNORMAL HIGH (ref 11.5–15.5)
WBC: 12.3 10*3/uL — ABNORMAL HIGH (ref 4.0–10.5)

## 2014-03-22 LAB — MRSA PCR SCREENING: MRSA by PCR: NEGATIVE

## 2014-03-22 LAB — CULTURE, BLOOD (ROUTINE X 2)
Culture: NO GROWTH
Culture: NO GROWTH

## 2014-03-22 LAB — GLUCOSE, CAPILLARY
GLUCOSE-CAPILLARY: 142 mg/dL — AB (ref 70–99)
Glucose-Capillary: 115 mg/dL — ABNORMAL HIGH (ref 70–99)
Glucose-Capillary: 144 mg/dL — ABNORMAL HIGH (ref 70–99)
Glucose-Capillary: 162 mg/dL — ABNORMAL HIGH (ref 70–99)

## 2014-03-22 LAB — PROTIME-INR
INR: 3.24 — ABNORMAL HIGH (ref 0.00–1.49)
PROTHROMBIN TIME: 31.9 s — AB (ref 11.6–15.2)

## 2014-03-22 MED ORDER — WARFARIN SODIUM 4 MG PO TABS: 4.0000 mg | ORAL_TABLET | Freq: Once | ORAL | Status: DC

## 2014-03-22 MED ORDER — SODIUM BICARBONATE 650 MG PO TABS
650.0000 mg | ORAL_TABLET | Freq: Three times a day (TID) | ORAL | Status: DC
  Administered 2014-03-22 – 2014-04-06 (×43): 650 mg via ORAL
  Filled 2014-03-22 (×47): qty 1

## 2014-03-22 MED ORDER — BUPROPION HCL ER (XL) 300 MG PO TB24
300.0000 mg | ORAL_TABLET | Freq: Every day | ORAL | Status: DC
  Administered 2014-03-23 – 2014-04-06 (×15): 300 mg via ORAL
  Filled 2014-03-22 (×16): qty 1

## 2014-03-22 MED ORDER — ALPRAZOLAM 0.25 MG PO TABS: 0.2500 mg | ORAL_TABLET | Freq: Two times a day (BID) | ORAL | Status: DC | PRN

## 2014-03-22 MED ORDER — ALPRAZOLAM 0.25 MG PO TABS
0.2500 mg | ORAL_TABLET | Freq: Two times a day (BID) | ORAL | Status: DC | PRN
  Administered 2014-03-22: 0.25 mg via ORAL
  Filled 2014-03-22: qty 1

## 2014-03-22 MED ORDER — CIPROFLOXACIN HCL 500 MG PO TABS
500.0000 mg | ORAL_TABLET | Freq: Two times a day (BID) | ORAL | Status: DC
  Administered 2014-03-22 – 2014-04-01 (×20): 500 mg via ORAL
  Filled 2014-03-22 (×22): qty 1

## 2014-03-22 MED ORDER — HYDROCODONE-ACETAMINOPHEN 5-325 MG PO TABS: 1.0000 | ORAL_TABLET | ORAL | Status: DC | PRN

## 2014-03-22 MED ORDER — INSULIN GLARGINE 100 UNIT/ML ~~LOC~~ SOLN
30.0000 [IU] | Freq: Every day | SUBCUTANEOUS | Status: DC
  Administered 2014-03-22 – 2014-04-05 (×15): 30 [IU] via SUBCUTANEOUS
  Filled 2014-03-22 (×16): qty 0.3

## 2014-03-22 MED ORDER — FUROSEMIDE 40 MG PO TABS
40.0000 mg | ORAL_TABLET | Freq: Every day | ORAL | Status: DC
  Administered 2014-03-23 – 2014-04-06 (×15): 40 mg via ORAL
  Filled 2014-03-22 (×16): qty 1

## 2014-03-22 MED ORDER — WARFARIN SODIUM 4 MG PO TABS
4.0000 mg | ORAL_TABLET | Freq: Once | ORAL | Status: DC
  Filled 2014-03-22: qty 1

## 2014-03-22 MED ORDER — INSULIN ASPART 100 UNIT/ML ~~LOC~~ SOLN
0.0000 [IU] | Freq: Three times a day (TID) | SUBCUTANEOUS | Status: DC
  Administered 2014-03-23: 1 [IU] via SUBCUTANEOUS
  Administered 2014-03-24: 2 [IU] via SUBCUTANEOUS
  Administered 2014-03-24 – 2014-03-28 (×8): 1 [IU] via SUBCUTANEOUS
  Administered 2014-03-29: 2 [IU] via SUBCUTANEOUS
  Administered 2014-03-29 – 2014-03-31 (×5): 1 [IU] via SUBCUTANEOUS
  Administered 2014-04-01: 2 [IU] via SUBCUTANEOUS
  Administered 2014-04-01 – 2014-04-04 (×3): 1 [IU] via SUBCUTANEOUS
  Administered 2014-04-05 – 2014-04-06 (×2): 2 [IU] via SUBCUTANEOUS

## 2014-03-22 MED ORDER — SIMVASTATIN 20 MG PO TABS
20.0000 mg | ORAL_TABLET | Freq: Every day | ORAL | Status: DC
  Administered 2014-03-23 – 2014-04-05 (×14): 20 mg via ORAL
  Filled 2014-03-22 (×15): qty 1

## 2014-03-22 MED ORDER — ALBUTEROL SULFATE (2.5 MG/3ML) 0.083% IN NEBU: 2.5000 mg | INHALATION_SOLUTION | Freq: Four times a day (QID) | RESPIRATORY_TRACT | Status: DC | PRN

## 2014-03-22 MED ORDER — SODIUM BICARBONATE 650 MG PO TABS: 650.0000 mg | ORAL_TABLET | Freq: Three times a day (TID) | ORAL | Status: DC

## 2014-03-22 MED ORDER — ACETAMINOPHEN 325 MG PO TABS
325.0000 mg | ORAL_TABLET | ORAL | Status: DC | PRN
  Administered 2014-03-22 – 2014-03-27 (×4): 650 mg via ORAL
  Filled 2014-03-22 (×3): qty 2

## 2014-03-22 MED ORDER — METOPROLOL TARTRATE 12.5 MG HALF TABLET
12.5000 mg | ORAL_TABLET | Freq: Two times a day (BID) | ORAL | Status: DC
  Administered 2014-03-23 – 2014-03-27 (×8): 12.5 mg via ORAL
  Filled 2014-03-22 (×14): qty 1

## 2014-03-22 MED ORDER — CIPROFLOXACIN HCL 500 MG PO TABS: 500.0000 mg | ORAL_TABLET | Freq: Two times a day (BID) | ORAL | Status: DC

## 2014-03-22 MED ORDER — HYDROCODONE-ACETAMINOPHEN 5-325 MG PO TABS
1.0000 | ORAL_TABLET | ORAL | Status: DC | PRN
  Administered 2014-03-23 – 2014-04-06 (×35): 1 via ORAL
  Filled 2014-03-22 (×36): qty 1

## 2014-03-22 MED ORDER — ALBUTEROL SULFATE (2.5 MG/3ML) 0.083% IN NEBU
2.5000 mg | INHALATION_SOLUTION | Freq: Four times a day (QID) | RESPIRATORY_TRACT | Status: DC | PRN
Start: 2014-03-22 — End: 2014-04-06

## 2014-03-22 MED ORDER — METOPROLOL TARTRATE 25 MG PO TABS: 12.5000 mg | ORAL_TABLET | Freq: Two times a day (BID) | ORAL | Status: DC

## 2014-03-22 MED ORDER — WARFARIN - PHARMACIST DOSING INPATIENT
Freq: Every day | Status: DC
  Administered 2014-03-28 – 2014-03-31 (×2)

## 2014-03-22 MED ORDER — DOXYCYCLINE HYCLATE 100 MG PO TABS
100.0000 mg | ORAL_TABLET | Freq: Two times a day (BID) | ORAL | Status: DC
  Administered 2014-03-22 – 2014-04-01 (×20): 100 mg via ORAL
  Filled 2014-03-22 (×22): qty 1

## 2014-03-22 MED ORDER — ONDANSETRON HCL 4 MG PO TABS: 4.0000 mg | ORAL_TABLET | Freq: Four times a day (QID) | ORAL | Status: DC | PRN

## 2014-03-22 MED ORDER — ONDANSETRON HCL 4 MG/2ML IJ SOLN: 4.0000 mg | Freq: Four times a day (QID) | INTRAMUSCULAR | Status: DC | PRN

## 2014-03-22 MED ORDER — SORBITOL 70 % SOLN
30.0000 mL | Freq: Every day | Status: DC | PRN
Start: 2014-03-22 — End: 2014-04-06

## 2014-03-22 MED ORDER — GUAIFENESIN ER 600 MG PO TB12
600.0000 mg | ORAL_TABLET | Freq: Two times a day (BID) | ORAL | Status: DC | PRN
  Administered 2014-03-23 – 2014-03-29 (×5): 600 mg via ORAL
  Filled 2014-03-22 (×4): qty 1

## 2014-03-22 MED ORDER — DOXYCYCLINE HYCLATE 100 MG PO TABS: 100.0000 mg | ORAL_TABLET | Freq: Two times a day (BID) | ORAL | Status: DC

## 2014-03-22 NOTE — Progress Notes (Signed)
Report given to Ed RN in inpatient rehab, awaiting patients bed to be delivered to inpatient rehab prior to transfer.  Will continue to monitor.  [facility] CenterWilliams,Tequia 03/22/2014 4:38 PM

## 2014-03-22 NOTE — Progress Notes (Signed)
Patient transported to 4W bed 6 inpatient rehab per bed accompanied by nurse and tech.  No s/s of distress noted throughout transport.  VS WNL.  Reita ClicheWilliams,Tequia 03/22/2014 6:55 PM

## 2014-03-22 NOTE — Progress Notes (Signed)
    Physical Medicine and Rehabilitation Consult Reason for Consult: Deconditioning/cellulitis/metabolic acidosis Referring Physician: [facility]   HPI: Robin [name] is a 63 y.o. right-handed female with history of hypertension, atrial fibrillation with Coumadin therapy, peripheral vascular disease with chronic leg wounds. Patient independent prior to admission living with her husband and using a straight point cane. Presented 03/16/2014 with generalized weakness and worsening of leg ulcers. Noted hyperkalemia 7.2 as well as hyponatremic at 111, BUN 139, creatinine 2.28. Patient did receive Kayexalate for hyperkalemia. IV fluid normal saline initiated for hyponatremia. Renal service consulted in relation to acute renal failure. Renal ultrasound with no hydronephrosis. It was felt that severe hyponatremia possibly related to excess water consumption on top of AKI. Placed on vancomycin and Zosyn for chronic leg ulcers. X-rays of right and left foot showed no evidence of osteomyelitis however there was a focal lucency at the base of the right third metatarsal. Renal function continues to improve with latest creatinine 1.61 and BUN 78. Sodium level 123 on 03/19/2014. Patient remains on chronic Coumadin for atrial fibrillation. MRSA screening positive maintained on contact precautions. Physical occupational therapy evaluations completed 03/18/2014 with recommendations for physical medicine rehabilitation consult.   Review of Systems  Constitutional: Positive for malaise/fatigue.  Cardiovascular: Positive for palpitations.  Gastrointestinal: Positive for constipation.  Musculoskeletal: Positive for falls.  Psychiatric/Behavioral:       Anxiety  All other systems reviewed and are negative.  Past Medical History  Diagnosis Date  . Diabetes mellitus   . Hypertension   . Hyperlipidemia   . Atrial fibrillation   . Sleep apnea   . Cholelithiasis   . CAD (coronary artery disease)    Past Surgical  History  Procedure Laterality Date  . Tonsillectomy  1970s  . Hemorroidectomy  1990s  . Choledochal cyst excision  1980s  . Parathyroidectomy    . Endovenous ablation saphenous vein w/ laser  08-02-2011  Right greater saphenous vein   Family History  Problem Relation Age of Onset  . Hypertension Mother   . Heart disease Father 55    MI  . COPD Father   . Other Brother     heart issues  . Heart disease Brother   . Other Brother     heart issues  . Heart disease Brother   . Drug abuse Brother    Social History:  reports that she has never smoked. She has never used smokeless tobacco. She reports that she does not drink alcohol or use illicit drugs. Allergies: No Known Allergies Medications Prior to Admission  Medication Sig Dispense Refill  . buPROPion (WELLBUTRIN XL) 300 MG 24 hr tablet Take 1 tablet by mouth daily.       . Furosemide (LASIX PO) Take 40 mg by mouth daily.       . insulin aspart (NOVOLOG) 100 UNIT/ML injection Inject 40-42 Units into the skin 3 (three) times daily before meals.      . insulin glargine (LANTUS) 100 UNIT/ML injection Inject 30 Units into the skin at bedtime.       . metoprolol tartrate (LOPRESSOR) 25 MG tablet Take 25 mg by mouth 2 (two) times daily.      . neomycin-bacitracin-polymyxin (NEOSPORIN) ointment Apply 1 application topically 2 (two) times daily. apply to eye      . simvastatin (ZOCOR) 20 MG tablet Take 1 tablet by mouth daily.       . warfarin (COUMADIN) 5 MG tablet Take 5 mg by mouth daily. Take   as directed by [facility]        Home: Home Living Family/patient expects to be discharged to:: Private residence Living Arrangements: Spouse/significant other;Children Available Help at Discharge: Family;Available PRN/intermittently Type of Home: House Home Access: Stairs to enter Entrance Stairs-Number of Steps: 3 Entrance Stairs-Rails: Can reach both Home Layout: Two level;Able to live on main level with bedroom/bathroom Home  Equipment: Cane - single point;Wheelchair - manual;Toilet riser Additional Comments: using w/c increasingly more x3 days prior to admission. 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 [facility] is currently [facility] during [facility] day for day program.   Functional History: Prior Function Level of Independence: Independent with assistive device(s);Needs assistance Gait / Transfers Assistance Needed: used cane up until a week ago ADL's / Homemaking Assistance Needed: sponge bath required, previous to BIL LE wounds showered independently. Dressing from recliner and husband completes LB (A) Functional Status:  Mobility: Bed Mobility Overal bed mobility: Needs Assistance;+2 for physical assistance Bed Mobility: Supine to Sit;Rolling Rolling: Max assist Supine to sit: +2 for physical assistance;Max assist;HOB elevated General bed mobility comments: pt log rolled to the Rt side due to c/o back pain. pt educated on back precaution to help with back pain during bed mobilty. pt educated on the need for frequent position changes to decr presssure wounds.  Transfers Overall transfer level: Needs assistance Equipment used: 2 person hand held assist Transfers: Sit to/from Stand;Stand Pivot Transfers Sit to Stand: Max assist;+2 physical assistance Stand pivot transfers: Mod assist;+2 physical assistance General transfer comment: Pt needed verbalize education to help with anxiety prior to standing. pt very anxious to move with x2 females adn not males. pt once standing able to side step to 3n1 and then chair.       ADL: ADL Overall ADL's : Needs assistance/impaired Eating/Feeding: Set up;Sitting Eating/Feeding Details (indicate cue type and reason): Pt with poor awareness to PO intake with current medical state. Pt asking spouse to go to the [facility] cafe to obtain items not on diet plan. pt requesting incr liquid intake. pt educated during session again of fluid  restrictions. pt states "but the doctor says I am getting better so I should be able to have more" Grooming: Wash/dry face;Set up;Sitting Toilet Transfer: +2 for physical assistance;Moderate assistance;BSC Toilet Transfer Details (indicate cue type and reason): required max cueing and very anxious initially with attempt. pt is able to anterior weight shift and step to commode with cueing Toileting- Clothing Manipulation and Hygiene: Total assistance General ADL Comments: Pt noted to have decr BP on arrival with Map 55. Pt assisted to EOB with no compliants. Pt with incr Map . pt transfered to chair with MAP 72 . pt no complaints and reports "this feels so good thank you" Pt tearful in chair and tells therapist how pastor prayed for her this morning and now she is able to transfer to the chair. pt very happy with progress.   Cognition: Cognition Overall Cognitive Status: Impaired/Different from baseline Orientation Level: Oriented X4 Cognition Arousal/Alertness: Awake/alert Behavior During Therapy: Anxious Overall Cognitive Status: Impaired/Different from baseline Area of Impairment: Orientation;Attention;Memory;Following commands;Safety/judgement;Problem solving;Awareness Orientation Level: Disoriented to;Situation (reports fireman dropped her) Current Attention Level: Sustained Memory: Decreased short-term memory Following Commands: Follows multi-step commands with increased time Safety/Judgement: Decreased awareness of safety;Decreased awareness of deficits Awareness: Emergent Problem Solving: Slow processing;Decreased initiation;Difficulty sequencing General Comments: pt reports fireman dropping patient during transfer. Spouse states "why did she say that? thats not true." pt   asked to recount events PTA and never mentioned falling with fireman assistance. OT cueing patient to this fact and pt states "well I remember falling with them" Pt asking if RN phone in hall was personal cell phone  ringing. Spouse reports your phone is not here. pt denies this fact and insist phone is present.   Blood pressure 95/52, pulse 70, temperature 98 F (36.7 C), temperature source Oral, resp. rate 19, height 5' 6" (1.676 m), weight 110 kg (242 lb 8.1 oz), SpO2 98.00%. Physical Exam  Vitals reviewed. Constitutional: She is oriented to person, place, and time.  HENT:  Head: Normocephalic.  Eyes: EOM are normal.  Neck: Normal range of motion. Neck supple. No thyromegaly present.  Cardiovascular:  Cardiac rate controlled  Respiratory: Effort normal and breath sounds normal. No respiratory distress.  GI: Soft. Bowel sounds are normal. She exhibits no distension.  Musculoskeletal:  1+ to 2+ edema bilateral LE  Neurological: She is alert and oriented to person, place, and time.  Follows full commands. Alert and appropriate. No sensory deficits except for the feet which are slightly dimnished for light touch. UE's grossly 4 to 4+/.5 prox to distal. LE's 3- HF, 2+ to 3- KE and 3/5 ankles (knees/ankles limited partly due to dressings, edema also)  Skin:  Bilateral lower extremities with dry dressing in place.  Bruises scattered in UE's. A few abrasions areas over toes are visible.   Psychiatric: She has a normal mood and affect. Her behavior is normal. Judgment and thought content normal.    Results for orders placed during the hospital encounter of 03/16/14 (from the past 24 hour(s))  GLUCOSE, CAPILLARY     Status: Abnormal   Collection Time    03/18/14  7:55 AM      Result Value Ref Range   Glucose-Capillary 171 (*) 70 - 99 mg/dL  BASIC METABOLIC PANEL     Status: Abnormal   Collection Time    03/18/14  9:36 AM      Result Value Ref Range   Sodium 126 (*) 137 - 147 mEq/L   Potassium 2.6 (*) 3.7 - 5.3 mEq/L   Chloride 88 (*) 96 - 112 mEq/L   CO2 23  19 - 32 mEq/L   Glucose, Bld 172 (*) 70 - 99 mg/dL   BUN 86 (*) 6 - 23 mg/dL   Creatinine, Ser 1.55 (*) 0.50 - 1.10 mg/dL   Calcium 7.7 (*)  8.4 - 10.5 mg/dL   GFR calc non Af Amer 35 (*) >90 mL/min   GFR calc Af Amer 41 (*) >90 mL/min  GLUCOSE, CAPILLARY     Status: Abnormal   Collection Time    03/18/14 11:32 AM      Result Value Ref Range   Glucose-Capillary 171 (*) 70 - 99 mg/dL  BASIC METABOLIC PANEL     Status: Abnormal   Collection Time    03/18/14  4:00 PM      Result Value Ref Range   Sodium 125 (*) 137 - 147 mEq/L   Potassium 3.0 (*) 3.7 - 5.3 mEq/L   Chloride 90 (*) 96 - 112 mEq/L   CO2 21  19 - 32 mEq/L   Glucose, Bld 176 (*) 70 - 99 mg/dL   BUN 80 (*) 6 - 23 mg/dL   Creatinine, Ser 1.56 (*) 0.50 - 1.10 mg/dL   Calcium 7.4 (*) 8.4 - 10.5 mg/dL   GFR calc non Af Amer 35 (*) >90 mL/min   GFR calc   Af Amer 40 (*) >90 mL/min  IRON AND TIBC     Status: Abnormal   Collection Time    03/18/14  4:00 PM      Result Value Ref Range   Iron 17 (*) 42 - 135 ug/dL   TIBC 291  250 - 470 ug/dL   Saturation Ratios 6 (*) 20 - 55 %   UIBC 274  125 - 400 ug/dL  FERRITIN     Status: None   Collection Time    03/18/14  4:00 PM      Result Value Ref Range   Ferritin 18  10 - 291 ng/mL  GLUCOSE, CAPILLARY     Status: Abnormal   Collection Time    03/18/14  5:15 PM      Result Value Ref Range   Glucose-Capillary 160 (*) 70 - 99 mg/dL   Comment 1 Notify RN    GLUCOSE, CAPILLARY     Status: Abnormal   Collection Time    03/18/14  9:46 PM      Result Value Ref Range   Glucose-Capillary 166 (*) 70 - 99 mg/dL  BASIC METABOLIC PANEL     Status: Abnormal   Collection Time    03/19/14  3:05 AM      Result Value Ref Range   Sodium 123 (*) 137 - 147 mEq/L   Potassium 3.4 (*) 3.7 - 5.3 mEq/L   Chloride 87 (*) 96 - 112 mEq/L   CO2 19  19 - 32 mEq/L   Glucose, Bld 145 (*) 70 - 99 mg/dL   BUN 78 (*) 6 - 23 mg/dL   Creatinine, Ser 1.61 (*) 0.50 - 1.10 mg/dL   Calcium 7.7 (*) 8.4 - 10.5 mg/dL   GFR calc non Af Amer 33 (*) >90 mL/min   GFR calc Af Amer 39 (*) >90 mL/min  PROTIME-INR     Status: Abnormal   Collection Time     03/19/14  3:05 AM      Result Value Ref Range   Prothrombin Time 44.1 (*) 11.6 - 15.2 seconds   INR 4.95 (*) 0.00 - 1.49  RETICULOCYTES     Status: Abnormal   Collection Time    03/19/14  3:05 AM      Result Value Ref Range   Retic Ct Pct 4.3 (*) 0.4 - 3.1 %   RBC. 3.86 (*) 3.87 - 5.11 MIL/uL   Retic Count, Manual 166.0  19.0 - 186.0 K/uL  CBC     Status: Abnormal   Collection Time    03/19/14  3:05 AM      Result Value Ref Range   WBC 12.1 (*) 4.0 - 10.5 K/uL   RBC 3.86 (*) 3.87 - 5.11 MIL/uL   Hemoglobin 7.9 (*) 12.0 - 15.0 g/dL   HCT 26.4 (*) 36.0 - 46.0 %   MCV 68.4 (*) 78.0 - 100.0 fL   MCH 20.5 (*) 26.0 - 34.0 pg   MCHC 29.9 (*) 30.0 - 36.0 g/dL   RDW 18.9 (*) 11.5 - 15.5 %   Platelets 365  150 - 400 K/uL   No results found.  Assessment/Plan: Diagnosis: severe deconditioning due to multiple medical issues 1. Does the need for close, 24 hr/day medical supervision in concert with the patient's rehab needs make it unreasonable for this patient to be served in a less intensive setting? Yes 2. Co-Morbidities requiring supervision/potential complications: sepsis, acute renal failure, afib 3. Due to bladder management, bowel management, safety, skin/wound   care, disease management, medication administration, pain management and patient education, does the patient require 24 hr/day rehab nursing? Yes 4. Does the patient require coordinated care of a physician, rehab nurse, PT (1-2 hrs/day, 5 days/week) and OT (1-2 hrs/day, 5 days/week) to address physical and functional deficits in the context of the above medical diagnosis(es)? Yes Addressing deficits in the following areas: balance, endurance, locomotion, strength, transferring, bowel/bladder control, bathing, dressing, feeding, grooming, toileting and psychosocial support 5. Can the patient actively participate in an intensive therapy program of at least 3 hrs of therapy per day at least 5 days per week? Yes 6. The potential for  patient to make measurable gains while on inpatient rehab is excellent 7. Anticipated functional outcomes upon discharge from inpatient rehab are modified independent  with PT, modified independent with OT, n/a with SLP. 8. Estimated rehab length of stay to reach the above functional goals is: 14-20 days 9. Does the patient have adequate social supports to accommodate these discharge functional goals? Yes 10. Anticipated D/C setting: Home 11. Anticipated post D/C treatments: HH therapy and Outpatient therapy 12. Overall Rehab/Functional Prognosis: excellent  RECOMMENDATIONS: This patient's condition is appropriate for continued rehabilitative care in the following setting: CIR Patient has agreed to participate in recommended program. Yes Note that insurance prior authorization may be required for reimbursement for recommended care.  Comment: Pt is motivated. She lives at home with disabled son and husband who works. Needs to be modified independent during the day. Rehab Admissions Coordinator to follow up.  Thanks,  Zachary T. Swartz, MD, FAAPMR     03/19/2014  

## 2014-03-22 NOTE — Progress Notes (Signed)
PMR Admission Coordinator Pre-Admission Assessment  Patient: Robin [name] is an 63 y.o., female  MRN: [id number]  DOB: 06/13/1951  Height: 5\' 5"  (165.1 cm)  Weight: 111.5 kg (245 lb 13 oz)  Insurance Information  HMO: PPO: yes PCP: IPA: 80/20: OTHER:  PRIMARY: [place] BCBS Policy#: [id number] Subscriber: spouse  CM Name: Robin [name] Phone#: [phone] Fax#: [phone]  Pre-Cert#: [id number] approved with need to update 03/27/2014. Admission originally denied but approved after peer to peer review and additional clinicals Employer:  Benefits: Phone #: [phone] Name: 03/19/14  Eff. Date: 10/04/13 Deduct: $700 Out of Pocket Max: $3210 not include deductible Life Max: none  CIR: 80% SNF: 80%  Outpatient: 80% Co-Pay: no visit limit  Home Health: 80% Co-Pay: 20%  DME: 80% Co-Pay: 20%  Providers: in network   SECONDARY: none  Medicaid Application Date: Case Manager:  Disability Application Date: Case Worker:  Emergency Games developerContact Information    Contact Information     Name  Relation  Home  Work  Mobile     [place]  Spouse  [phone]  [phone]  [phone]        Current Medical History  Patient Admitting Diagnosis:severe deconditioning due to multiple medical issues  History of Present Illness:Robin Debroah BallerM [name] is a 63 y.o. right-handed female with history of hypertension, atrial fibrillation with Coumadin therapy, peripheral vascular disease with chronic leg wounds. Patient independent prior to admission living with her husband and using a straight point cane.  Presented 03/16/2014 with generalized weakness and worsening of leg ulcers. Noted hyperkalemia 7.2 as well as hyponatremic at 111, BUN 139, creatinine 2.28. Patient did receive Kayexalate for hyperkalemia. IV fluid normal saline initiated for hyponatremia. Renal service consulted in relation to acute renal failure. Renal ultrasound with no hydronephrosis. It was felt that severe hyponatremia possibly related  to excess water consumption on top of AKI. Placed on vancomycin and Zosyn for chronic leg ulcers. X-rays of right and left foot showed no evidence of osteomyelitis however there was a focal lucency at the base of the right third metatarsal. Renal function continues to improve with latest creatinine 1.50 and BUN 76 on 03/21/14. Sodium level 130 on 03/21/2014. Patient remains on chronic Coumadin for atrial fibrillation. MRSA screening positive maintained on contact precautions.  Past Medical History    Past Medical History    Diagnosis  Date    .  Diabetes mellitus     .  Hypertension     .  Hyperlipidemia     .  Atrial fibrillation     .  Sleep apnea     .  Cholelithiasis     .  CAD (coronary artery disease)      Family History  family history includes COPD in her father; Drug abuse in her brother; Heart disease in her brother and brother; Heart disease (age of onset: 6855) in her father; Hypertension in her mother; Other in her brother and brother.  Prior Rehab/Hospitalizations: none  Current Medications  Current facility-administered medications:0.9 % sodium chloride infusion, , Intravenous, Continuous, Lonia BloodJeffrey T McClung, MD, Last Rate: 10 mL/hr at 03/19/14 1354, 10 mL/hr at 03/19/14 1354; acetaminophen (TYLENOL) suppository 650 mg, 650 mg, Rectal, Q6H PRN, Esperanza SheetsUlugbek N Buriev, MD; acetaminophen (TYLENOL) tablet 650 mg, 650 mg, Oral, Q6H PRN, Esperanza SheetsUlugbek N Buriev, MD, 650 mg at 03/22/14 0035  albuterol (PROVENTIL) (2.5 MG/3ML) 0.083% nebulizer solution 2.5 mg, 2.5 mg, Nebulization, Q6H PRN, Esperanza SheetsUlugbek N Buriev, MD; ALPRAZolam Prudy Feeler(XANAX) tablet 0.25 mg, 0.25 mg, Oral,  BID PRN, Lonia Blood, MD, 0.25 mg at 03/22/14 0035; alum & mag hydroxide-simeth (MAALOX/MYLANTA) 200-200-20 MG/5ML suspension 15 mL, 15 mL, Oral, Q6H PRN, Lonia Blood, MD, 15 mL at 03/20/14 2138  buPROPion (WELLBUTRIN XL) 24 hr tablet 300 mg, 300 mg, Oral, Daily, Esperanza Sheets, MD, 300 mg at 03/22/14 1013; ciprofloxacin (CIPRO) tablet  500 mg, 500 mg, Oral, BID, Ripudeep K Rai, MD, 500 mg at 03/22/14 1012; dextrose 50 % solution 25 mL, 25 mL, Intravenous, PRN, Esperanza Sheets, MD; doxycycline (VIBRA-TABS) tablet 100 mg, 100 mg, Oral, Q12H, Lonia Blood, MD, 100 mg at 03/22/14 1012  ferumoxytol (FERAHEME) 510 mg in sodium chloride 0.9 % 100 mL IVPB, 510 mg, Intravenous, Q3 days, Sadie Haber, MD, 510 mg at 03/19/14 1352; furosemide (LASIX) tablet 40 mg, 40 mg, Oral, Daily, Lonia Blood, MD, 40 mg at 03/22/14 1012; HYDROcodone-acetaminophen (NORCO/VICODIN) 5-325 MG per tablet 1 tablet, 1 tablet, Oral, Q4H PRN, Roma Kayser Schorr, NP  insulin aspart (novoLOG) injection 0-9 Units, 0-9 Units, Subcutaneous, TID WC, Lonia Blood, MD, 1 Units at 03/21/14 1717; insulin glargine (LANTUS) injection 30 Units, 30 Units, Subcutaneous, QHS, Lonia Blood, MD, 30 Units at 03/21/14 2342; metoprolol tartrate (LOPRESSOR) tablet 12.5 mg, 12.5 mg, Oral, BID, Lonia Blood, MD, 12.5 mg at 03/22/14 1013  ondansetron (ZOFRAN) injection 4 mg, 4 mg, Intravenous, Q6H PRN, Esperanza Sheets, MD, 4 mg at 03/20/14 0302; ondansetron (ZOFRAN) tablet 4 mg, 4 mg, Oral, Q6H PRN, Esperanza Sheets, MD; simvastatin (ZOCOR) tablet 20 mg, 20 mg, Oral, q1800, Esperanza Sheets, MD, 20 mg at 03/21/14 1716; sodium bicarbonate tablet 650 mg, 650 mg, Oral, TID, Sadie Haber, MD, 650 mg at 03/22/14 1013  sodium chloride 0.9 % injection 3 mL, 3 mL, Intravenous, Q12H, Esperanza Sheets, MD, 3 mL at 03/22/14 1013; Warfarin - Pharmacist Dosing Inpatient, , Does not apply, q1800, Fredrik Rigger, [facility]  Patients Current Diet: Carb Control 1000 cc fluid restriction per nephrology  Precautions / Restrictions  Precautions  Precautions: Fall  Restrictions  Weight Bearing Restrictions: No  Prior Activity Level  Household: out into community maybe once per week. 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 Quit due to  progressive issues with standing for long periods of time. Was Mod I with cane pta but becoming progressively weaker weeks to months pta. She assisted in the care of her handicapped son, Molly Maduro. Molly Maduro has epilepsy and has cognitive deficits. He is ambulatory and she describes him as mentally an 63 year old. She and her husband would assist him to shower, brush teeth, etc. Pt walking with cane only short distances, rarely drove and describes herself sedentary.  Home Assistive Devices / Equipment  Home Assistive Devices/Equipment: Dan Humphreys (specify type)  Home Equipment: Cane - single point;Wheelchair - manual;Toilet riser  Prior Functional Level  Prior Function  Level of Independence: Independent with assistive device(s);Needs assistance  Gait / Transfers Assistance Needed: used cane up until a week ago  ADL's / Homemaking Assistance Needed: sponge bath required, previous to BIL LE wounds showered independently. Dressing from recliner and husband completes LB (A)  Comments: has been sponge bathing for 3 years due to high side of bath  Current Functional Level    Cognition  Overall Cognitive Status: Impaired/Different from baseline  Current Attention Level: Sustained  Orientation Level: Oriented X4  Following Commands: Follows multi-step commands with increased time  Safety/Judgement: Decreased awareness  of deficits  General Comments: Pt. needs manual, demo and verbal cueing for task completion. Pt. able to recall when I asked her to remember what command I had just given to her.    Extremity Assessment  (includes Sensation/Coordination)      ADLs  Overall ADL's : Needs assistance/impaired  Eating/Feeding: Set up;Sitting  Eating/Feeding Details (indicate cue type and reason): pt sitting at EOB for drinking soda  Grooming: Brushing hair;Set up;Sitting  Upper Body Dressing : Minimal assistance;Sitting  Lower Body Dressing: Maximal assistance;Sitting/lateral leans;Bed level  Lower Body Dressing  Details (indicate cue type and reason): don socks  Toilet Transfer: Minimal assistance;Moderate assistance;BSC  Toilet Transfer Details (indicate cue type and reason): min a sit/stand with mod inst. and tactile guidance with ue placement on arm rests mod a to initiate pivotal steps. very anxious and required reassurement to ease the process  Toileting- Clothing Manipulation and Hygiene: Moderate assistance;Sit to/from stand  Toileting - Clothing Manipulation Details (indicate cue type and reason): simulated during pivot transfer  Functional mobility during ADLs: Minimal assistance;Moderate assistance  General ADL Comments: able to reach forward while in sitting but can not reach feet at this time for donning socks. has reacher and would benefit from continued training with a/e for lb dressing.    Mobility  Overal bed mobility: Needs Assistance  Bed Mobility: Rolling;Sidelying to Sit  Rolling: Min assist;Mod assist  Sidelying to sit: Min assist  Supine to sit: Mod assist;+2 for physical assistance;HOB elevated  Sit to supine: +2 for physical assistance;Max assist  General bed mobility comments: step by step cueing for hand placement on bed rail    Transfers  Overall transfer level: Needs assistance  Equipment used: Rolling walker (2 wheeled)  Transfers: Sit to/from Stand  Sit to Stand: Min assist  Stand pivot transfers: Mod assist  General transfer comment: min a for sit/stand mod a to initiate pivotal steps. still anxious and requires reasurement to proceed with desired tasks    Ambulation / Gait / Stairs / Wheelchair Mobility  Ambulation/Gait  Ambulation/Gait assistance: Mod assist;+2 physical assistance  Ambulation Distance (Feet): 30 Feet (15 x 2 with seated rest)  Assistive device: Rolling walker (2 wheeled)  Gait Pattern/deviations: Step-to pattern;Decreased step length - right;Decreased step length - left  Gait velocity: decreased  General Gait Details: Pt. needed assist to advance RW  and verbal cues to step through with bilaterally. She also needed prompting to increase step length especially on right LE    Posture / Balance  Dynamic Sitting Balance  Sitting balance - Comments: Pt. able to maintain sitting at EOB for 10 minutes and completed LE exercises while sitting.    Special needs/care consideration  BiPAP/CPAP none  Continuous Drip IV none  Oxygen none  Skin 6/14/15WOC wound consult note  Reason for Consult: Patient with chronic, non-healing LE ulcerations. Reports she has been seen in the outpatient wound are center by Dr. Wayland Denis, but has not been to the clinic in months. She states that, "Dr.Sanger told me that nothing more could be done for my legs." Presents also with intertriginous dermatitis (suspect fungal) beneath breasts and beneath abdominal panus, a full thickness ulceration on the upper right medial thigh near the labia and a pressure ulcer, Stage III, at the right buttock at the gluteal cleft.  Wound type: Intertriginous dermatitis with suspected fungal overgrowth and ulceration at right medial thigh near perineum, pressure ulcer at right buttock at gluteal cleft and chronic LE ulcerations, suspect mixed etiology  Pressure Ulcer POA: Yes (at right buttock)  Measurement:Right buttock, Stage III PrU measuring 2cm x 2cm x 0.2cm with 40% necrotic yellow slough and 60% red, moist tissue. Right inner thigh, proximal and near perineum: 2.5cm x 3cm x 0.2cm full thickness tissue loss with mosist red tissue. Right LE with numerous pinpoint open areas and two ulcers: Distal full thickness ulcer measures 3cm x 1cm x 0.2cm, Proximal full thickness ulceration measures 2cm x 2.5cm x 0.2cm. Left LE with numerous pinpoint ulcerations and 1 ulcer at the lateral malleolus measuring 3cm x 2cm x 0.2cm. All LE ulcers are moist, red and clear of necrotic tissue.  Wound bed:As described above.  Drainage (amount, consistency, odor) Serosanguinous exudate  Periwound:No LE edema, LEs  with what appears to be hemosiderin staining vs rubor. Periwound in perineum is red, moist and with some evidence of epidermal peeling suggestive of fungal overgrowth.  Dressing procedure/placement/frequency: I will suggest a non-adherent dressing to the bilateral LEs (white petrolatum with padding of the legs and securement of the dressings with Kerlix changed once daily. If you feel that further workup is indicated, you might consult VVS service for continuing care. Under their direction, a referral to an outpatient wound care center may or may not be indicated. I have provided bilateral Prevalon Boots for pressure redistribution while in bed.I have also provided an antimicrobial textile for treatment of the intertriginous dermatitis at the panus and inframammary areas. A topical zinc-oxide paste is provided for the open area at the right medial and proximal thigh near the perineum. A soft silicone foam dressing is provided (as well as a therapeutic mattress replacement with low air loss feature) for treatment of the Stage III pressure ulcer.  WOC nursing team will not follow, but will remain available to this patient, the nursing and medical teams. Please re-consult if needed.  Thanks,  Ladona Mow, MSN, RN, GNP, [place], CWON-AP [phone])  03/18/2014 WOC wound consult note  Reason for Consult: Patient with chronic, non-healing LE ulcerations. Reports she has been seen in the outpatient wound are center by Dr. Wayland Denis, but has not been to the clinic in months. She states that, "Dr.Sanger told me that nothing more could be done for my legs." Presents also with intertriginous dermatitis (suspect fungal) beneath breasts and beneath abdominal panus, a full thickness ulceration on the upper right medial thigh near the labia and a pressure ulcer, Stage III, at the right buttock at the gluteal cleft.  Wound type: Intertriginous dermatitis with suspected fungal overgrowth and ulceration at right medial  thigh near perineum, pressure ulcer at right buttock at gluteal cleft and chronic LE ulcerations, suspect mixed etiology  Pressure Ulcer POA: Yes (at right buttock)  Measurement:Right buttock, Stage III PrU measuring 2cm x 2cm x 0.2cm with 40% necrotic yellow slough and 60% red, moist tissue. Right inner thigh, proximal and near perineum: 2.5cm x 3cm x 0.2cm full thickness tissue loss with mosist red tissue. Right LE with numerous pinpoint open areas and two ulcers: Distal full thickness ulcer measures 3cm x 1cm x 0.2cm, Proximal full thickness ulceration measures 2cm x 2.5cm x 0.2cm. Left LE with numerous pinpoint ulcerations and 1 ulcer at the lateral malleolus measuring 3cm x 2cm x 0.2cm. All LE ulcers are moist, red and clear of necrotic tissue.  Wound bed:As described above.  Drainage (amount, consistency, odor) Serosanguinous exudate  Periwound:No LE edema, LEs with what appears to be hemosiderin staining vs rubor. Periwound in perineum is red, moist and with some  evidence of epidermal peeling suggestive of fungal overgrowth.  Dressing procedure/placement/frequency: I will suggest a non-adherent dressing to the bilateral LEs (white petrolatum with padding of the legs and securement of the dressings with Kerlix changed once daily. If you feel that further workup is indicated, you might consult VVS service for continuing care. Under their direction, a referral to an outpatient wound care center may or may not be indicated. I have provided bilateral Prevalon Boots for pressure redistribution while in bed.I have also provided an antimicrobial textile for treatment of the intertriginous dermatitis at the panus and inframammary areas. A topical zinc-oxide paste is provided for the open area at the right medial and proximal thigh near the perineum. A soft silicone foam dressing is provided (as well as a therapeutic mattress replacement with low air loss feature) for treatment of the Stage III pressure ulcer.   WOC nursing team will not follow, but will remain available to this patient, the nursing and medical teams. Please re-consult if needed.  Thanks,  Ladona MowLaurie McNichol, MSN, RN, GNP, [place], [facility] [phone])    Bowel mgmt: last BM 6/17. quiac positive 6/16  Bladder mgmt: foley  Diabetic mgmt Hgb A1C 7.2 on 03/18/14    Previous Home Environment  Living Arrangements: Spouse/significant other;Children;Other (Comment) (one disabled child with epilepsy in his 5720s, Molly MaduroRobert)  Lives With: Spouse;Son;Other (Comment) Molly Maduro(Robert in his 1120s, epilepsy and cognitive deficits)  Available Help at Discharge: Family;Available PRN/intermittently;Other (Comment) (spouse works days, Molly MaduroRobert goes to after [employer] daily Monday)  Type of Home: [place]House  Home Layout: Two level;Able to live on main level with bedroom/bathroom  Home Access: Stairs to enter  Entrance Stairs-Rails: Can reach both  Entrance Stairs-Number of Steps: 3  Bathroom Shower/Tub: Tub/shower unit;Other (comment) (side of tub high therefore pt unable to step into tub theref)  Bathroom Toilet: Standard  Bathroom Accessibility: Yes  How Accessible: Accessible via walker  Home Care Services: No  Additional Comments: using w/c increasingly more x3 days prior to admission. 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 [facility].  Discharge Living Setting  Plans for Discharge Living Setting: Patient's home;Lives with (comment);Other (Comment) (spouse a dn adult handicapped son, Molly MaduroRobert. )  Type of Home at Discharge: House  Discharge Home Layout: Two level;Full bath on main level;Able to live on main level with bedroom/bathroom  Discharge Home Access: Stairs to enter  Entrance Stairs-Rails: Can reach both;Right;Left  Entrance Stairs-Number of Steps: 3  Discharge Bathroom Shower/Tub: Tub/shower unit  Discharge Bathroom Toilet: Standard  Discharge Bathroom Accessibility:  Yes  How Accessible: Accessible via walker  Does the patient have any problems obtaining your medications?: No  Social/Family/Support Systems  Patient Roles: Spouse;Parent;Caregiver;Other (Comment) (pt was assisting in the care of son, Molly MaduroRobert until she became)  Contact Information: Jule Serobert Brian, spouse  Anticipated Caregiver: Spouse  Anticipated Caregiver's Contact Information: see above  Ability/Limitations of Caregiver: spouse works in Consulting civil engineerT at [employer] firm daily. He carrys son, Molly MaduroRobert to adult after [facility] daily program daily and then picks him up to return home. Pt alone during the day  Caregiver Availability: Evenings only  Discharge Plan Discussed with Primary Caregiver: Yes  Is Caregiver In Agreement with Plan?: Yes  Does Caregiver/Family have Issues with Lodging/Transportation while Pt is in Rehab?: No  Goals/Additional Needs  Patient/Family Goal for Rehab: Mod I with PT and OT  Expected length of stay: ELOS 14 to 20 days  Dietary Needs: fluid restrictions 1000 cc per day  Pt/Family Agrees to Admission and willing to participate: Yes  Program Orientation Provided & Reviewed with Pt/Caregiver Including Roles & Responsibilities: Yes  Decrease burden of Care through IP rehab admission: n/a  Possible need for SNF placement upon discharge:no  Patient Condition: This patient's medical and functional status has changed since the consult dated: 03/19/14 in which the Rehabilitation Physician determined and documented that the patient's condition is appropriate for intensive rehabilitative care in an inpatient rehabilitation facility. See "History of Present Illness" (above) for medical update. Functional changes are: overall min to mod assist. Patient's medical and functional status update has been discussed with the Rehabilitation physician and patient remains appropriate for inpatient rehabilitation. Will admit to inpatient rehab today.  Preadmission Screen Completed By: Clois Dupes,  03/22/2014 12:52 PM  ______________________________________________________________________  Discussed status with Dr. Riley Kill on 03/22/14 at 1252 and received telephone approval for admission today.  Admission Coordinator: Clois Dupes, time 0981 Date 03/22/2014.    Cosigned by: Ranelle Oyster, MD [03/22/2014 12:54 PM]

## 2014-03-22 NOTE — Progress Notes (Signed)
I await BCBS approval today to admit pt to inpt rehab. I have discussed with pt, her spouse, RN CM and Dr. Isidoro Donningai. [phone]

## 2014-03-22 NOTE — H&P (View-Only) (Signed)
Physical Medicine and Rehabilitation Admission H&P    Chief Complaint  Patient presents with  . Weakness  . Wound Infection  : HPI: Robin [name] is a 63 y.o. right-handed female with history of hypertension, atrial fibrillation with Coumadin therapy, peripheral vascular disease with chronic leg wounds. Patient independent prior to admission living with her husband and using a straight point cane. Presented 03/16/2014 with generalized weakness and worsening of leg ulcers. Noted hyperkalemia 7.2 as well as hyponatremic at 111, BUN 139, creatinine 2.28. Patient did receive Kayexalate for hyperkalemia. IV fluid normal saline initiated for hyponatremia. Renal service consulted in relation to acute renal failure. Renal ultrasound with no hydronephrosis. It was felt that severe hyponatremia possibly related to excess water consumption on top of AKI. Placed on vancomycin and Zosyn for chronic leg ulcers and transition to oral antibiotics. X-rays of right and left foot showed no evidence of osteomyelitis however there was a focal lucency at the base of the right third metatarsal. Renal function continues to improve with latest creatinine 1.55 and BUN 65. Sodium level 130 on 03/20/2014. Patient remains on chronic Coumadin for atrial fibrillation. MRSA screening positive maintained on contact precautions. Physical and occupational therapy evaluations completed 03/18/2014 with recommendations for physical medicine rehabilitation consult. Patient was admitted for comprehensive rehabilitation program   ROS Review of Systems  Constitutional: Positive for malaise/fatigue.  Cardiovascular: Positive for palpitations.  Gastrointestinal: Positive for constipation.  Musculoskeletal: Positive for falls.  Psychiatric/Behavioral:  Anxiety /depression All other systems reviewed and are negative  Past Medical History  Diagnosis Date  . Diabetes mellitus   . Hypertension   . Hyperlipidemia   . Atrial  fibrillation   . Sleep apnea   . Cholelithiasis   . CAD (coronary artery disease)    Past Surgical History  Procedure Laterality Date  . Tonsillectomy  1970s  . Hemorroidectomy  1990s  . Choledochal cyst excision  1980s  . Parathyroidectomy    . Endovenous ablation saphenous vein w/ laser  08-02-2011  Right greater saphenous vein   Family History  Problem Relation Age of Onset  . Hypertension Mother   . Heart disease Father 51    MI  . COPD Father   . Other Brother     heart issues  . Heart disease Brother   . Other Brother     heart issues  . Heart disease Brother   . Drug abuse Brother    Social History:  reports that she has never smoked. She has never used smokeless tobacco. She reports that she does not drink alcohol or use illicit drugs. Allergies: No Known Allergies Medications Prior to Admission  Medication Sig Dispense Refill  . buPROPion (WELLBUTRIN XL) 300 MG 24 hr tablet Take 1 tablet by mouth daily.       . Furosemide (LASIX PO) Take 40 mg by mouth daily.       . insulin aspart (NOVOLOG) 100 UNIT/ML injection Inject 40-42 Units into the skin 3 (three) times daily before meals.      . insulin glargine (LANTUS) 100 UNIT/ML injection Inject 30 Units into the skin at bedtime.       . metoprolol tartrate (LOPRESSOR) 25 MG tablet Take 25 mg by mouth 2 (two) times daily.      Marland Kitchen neomycin-bacitracin-polymyxin (NEOSPORIN) ointment Apply 1 application topically 2 (two) times daily. apply to eye      . simvastatin (ZOCOR) 20 MG tablet Take 1 tablet by mouth daily.       Marland Kitchen  warfarin (COUMADIN) 5 MG tablet Take 5 mg by mouth daily. Take as directed by [facility]        Home: Home Living Family/patient expects to be discharged to:: Private residence Living Arrangements: Spouse/significant other;Children;Other (Comment) (one disabled child with epilepsy in his 69s, Robin [name]) Available Help at Discharge: Family;Available PRN/intermittently;Other (Comment) (spouse works  days, Robin [name] goes to after [employer] daily Monday) Type of Home: House Home Access: Stairs to enter [employer] of Steps: 3 Entrance Stairs-Rails: Can reach both Home Layout: Two level;Able to live on main level with bedroom/bathroom Home Equipment: Kasandra Knudsen - single point;Wheelchair - manual;Toilet riser Additional Comments: using w/c increasingly more x3 days prior to admission. 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 [facility].   Lives With: Spouse;Son;Other (Comment) Robin [name] in his 64s, epilepsy and cognitive deficits)   Functional History: Prior Function Level of Independence: Independent with assistive device(s);Needs assistance Gait / Transfers Assistance Needed: used cane up until a week ago ADL's / Homemaking Assistance Needed: sponge bath required, previous to BIL LE wounds showered independently. Dressing from recliner and husband completes LB (A) Comments: has been sponge bathing for 3 years due to high side of bath  Functional Status:  Mobility: Bed Mobility Overal bed mobility: Needs Assistance;+2 for physical assistance Bed Mobility: Supine to Sit Rolling: Max assist Supine to sit: Mod assist;+2 for physical assistance;HOB elevated General bed mobility comments: Pt able to transition to EOB with assist for LE movement as well as trunk elevation to full sitting.  Transfers Overall transfer level: Needs assistance Equipment used: 2 person hand held assist Transfers: Sit to/from [facility] Sit to Stand: +2 physical assistance;Mod assist;+2 safety/equipment Stand pivot transfers: Mod assist;+2 physical assistance;+2 safety/equipment General transfer comment: VC's for sequencing and technique. Pt very anxious about mobility and she has a big fear of falling. VC's for hand placement on seated surface for safety. Pt holding tight to therapist and tech during  SPT to [facility] and then recliner. No knee buckling noted.       ADL: ADL Overall ADL's : Needs assistance/impaired Eating/Feeding: Set up;Sitting Eating/Feeding Details (indicate cue type and reason): Pt with poor awareness to PO intake with current medical state. Pt asking spouse to go to the [facility] cafe to obtain items not on diet plan. pt requesting incr liquid intake. pt educated during session again of fluid restrictions. pt states "but the doctor says I am getting better so I should be able to have more" Grooming: Wash/dry face;Set up;Sitting Toilet Transfer: +2 for physical assistance;Moderate assistance;BSC Toilet Transfer Details (indicate cue type and reason): required max cueing and very anxious initially with attempt. pt is able to anterior weight shift and step to commode with cueing Toileting- Clothing Manipulation and Hygiene: Total assistance General ADL Comments: Pt noted to have decr BP on arrival with Map 55. Pt assisted to EOB with no compliants. Pt with incr Map . pt transfered to chair with MAP 72 . pt no complaints and reports "this feels so good thank you" Pt tearful in chair and tells therapist how pastor prayed for her this morning and now she is able to transfer to the chair. pt very happy with progress.   Cognition: Cognition Overall Cognitive Status: Within Functional Limits for tasks assessed Orientation Level: Oriented X4 Cognition Arousal/Alertness: Awake/alert Behavior During Therapy: Anxious Overall Cognitive Status: Within Functional Limits for tasks assessed Area of Impairment: Orientation;Attention;Memory;Following  commands;Safety/judgement;Problem solving;Awareness Orientation Level: Disoriented to;Situation (reports fireman dropped her) Current Attention Level: Sustained Memory: Decreased short-term memory Following Commands: Follows multi-step commands with increased time Safety/Judgement: Decreased awareness of safety;Decreased awareness of  deficits Awareness: Emergent Problem Solving: Slow processing;Decreased initiation;Difficulty sequencing General Comments: pt reports fireman dropping patient during transfer. Spouse states "why did she say that? thats not true." pt asked to recount events PTA and never mentioned falling with fireman assistance. OT cueing patient to this fact and pt states "well I remember falling with them" Pt asking if RN phone in hall was personal cell phone ringing. Spouse reports your phone is not here. pt denies this fact and insist phone is present.   Physical Exam: Blood pressure 80/54, pulse 73, temperature 98 F (36.7 C), temperature source Oral, resp. rate 17, height _0  (1.676 m), weight 110 kg (242 lb 8.1 oz), SpO2 97.00%. Physical Exam Constitutional: She is oriented to person, place, and time. obese HENT: oral mucosa pink and moist Head: Normocephalic.  Eyes: EOM are normal.  Neck: Normal range of motion. Neck supple. No thyromegaly present.  Cardiovascular:  Cardiac rate controlled. No murmur  Respiratory: Effort normal and breath sounds normal. No respiratory distress.  GI: Soft. Bowel sounds are normal. She exhibits no distension.  Musculoskeletal:  1+  bilateral LE , bilateral unna dressings in place Neurological: She is alert and oriented to person, place, and time.  Follows full commands. Alert and appropriate. No sensory deficits except for the feet which remain dimnished to light touch. UE's grossly 4 to 4+/5 deltoid, biceps, triceps, HI. LE's 3- HF,   3- KE and 3+/5 ankles. DTR's 1+ Skin:  Bilateral lower extremities with dry dressing in place. Scattered ecchymoses in UE's. A few abrasions areas over toes are visible. Stage 2 gluteal wound (left) with surrounding excoriated skin. Psychiatric: She has a normal mood and affect. Her behavior is normal. Judgment and thought content normal  Results for orders placed during the hospital encounter of 03/16/14 (from the past 48 hour(s))   GLUCOSE, CAPILLARY     Status: Abnormal   Collection Time    03/18/14  7:55 AM      Result Value Ref Range   Glucose-Capillary 171 (*) 70 - 99 mg/dL  BASIC METABOLIC PANEL     Status: Abnormal   Collection Time    03/18/14  9:36 AM      Result Value Ref Range   Sodium 126 (*) 137 - 147 mEq/L   Potassium 2.6 (*) 3.7 - 5.3 mEq/L   Comment: CRITICAL RESULT CALLED TO, READ BACK BY AND VERIFIED WITH:     S.SHAW,RN 03/18/14 1033 BY BSLADE   Chloride 88 (*) 96 - 112 mEq/L   CO2 23  19 - 32 mEq/L   Glucose, Bld 172 (*) 70 - 99 mg/dL   BUN 86 (*) 6 - 23 mg/dL   Creatinine, Ser 1.55 (*) 0.50 - 1.10 mg/dL   Calcium 7.7 (*) 8.4 - 10.5 mg/dL   GFR calc non Af Amer 35 (*) >90 mL/min   GFR calc Af Amer 41 (*) >90 mL/min   Comment: (NOTE)     The eGFR has been calculated using the CKD EPI equation.     This calculation has not been validated in all clinical situations.     eGFR's persistently <90 mL/min signify possible Chronic Kidney     Disease.  GLUCOSE, CAPILLARY     Status: Abnormal   Collection Time    03/18/14 11:32  AM      Result Value Ref Range   Glucose-Capillary 171 (*) 70 - 99 mg/dL  BASIC METABOLIC PANEL     Status: Abnormal   Collection Time    03/18/14  4:00 PM      Result Value Ref Range   Sodium 125 (*) 137 - 147 mEq/L   Potassium 3.0 (*) 3.7 - 5.3 mEq/L   Chloride 90 (*) 96 - 112 mEq/L   CO2 21  19 - 32 mEq/L   Glucose, Bld 176 (*) 70 - 99 mg/dL   BUN 80 (*) 6 - 23 mg/dL   Creatinine, Ser 1.56 (*) 0.50 - 1.10 mg/dL   Calcium 7.4 (*) 8.4 - 10.5 mg/dL   GFR calc non Af Amer 35 (*) >90 mL/min   GFR calc Af Amer 40 (*) >90 mL/min   Comment: (NOTE)     The eGFR has been calculated using the CKD EPI equation.     This calculation has not been validated in all clinical situations.     eGFR's persistently <90 mL/min signify possible Chronic Kidney     Disease.  IRON AND TIBC     Status: Abnormal   Collection Time    03/18/14  4:00 PM      Result Value Ref Range   Iron  17 (*) 42 - 135 ug/dL   TIBC 291  250 - 470 ug/dL   Saturation Ratios 6 (*) 20 - 55 %   UIBC 274  125 - 400 ug/dL   Comment: Performed at [facility]     Status: None   Collection Time    03/18/14  4:00 PM      Result Value Ref Range   Ferritin 18  10 - 291 ng/mL   Comment: Performed at [facility], CAPILLARY     Status: Abnormal   Collection Time    03/18/14  5:15 PM      Result Value Ref Range   Glucose-Capillary 160 (*) 70 - 99 mg/dL   Comment 1 Notify RN    GLUCOSE, CAPILLARY     Status: Abnormal   Collection Time    03/18/14  9:46 PM      Result Value Ref Range   Glucose-Capillary 166 (*) 70 - 99 mg/dL  BASIC METABOLIC PANEL     Status: Abnormal   Collection Time    03/19/14  3:05 AM      Result Value Ref Range   Sodium 123 (*) 137 - 147 mEq/L   Potassium 3.4 (*) 3.7 - 5.3 mEq/L   Comment: HEMOLYSIS AT THIS LEVEL MAY AFFECT RESULT   Chloride 87 (*) 96 - 112 mEq/L   CO2 19  19 - 32 mEq/L   Glucose, Bld 145 (*) 70 - 99 mg/dL   BUN 78 (*) 6 - 23 mg/dL   Creatinine, Ser 1.61 (*) 0.50 - 1.10 mg/dL   Calcium 7.7 (*) 8.4 - 10.5 mg/dL   GFR calc non Af Amer 33 (*) >90 mL/min   GFR calc Af Amer 39 (*) >90 mL/min   Comment: (NOTE)     The eGFR has been calculated using the CKD EPI equation.     This calculation has not been validated in all clinical situations.     eGFR's persistently <90 mL/min signify possible Chronic Kidney     Disease.  PROTIME-INR     Status: Abnormal   Collection Time    03/19/14  3:05  AM      Result Value Ref Range   Prothrombin Time 44.1 (*) 11.6 - 15.2 seconds   INR 4.95 (*) 0.00 - 1.49  VITAMIN B12     Status: None   Collection Time    03/19/14  3:05 AM      Result Value Ref Range   Vitamin B-12 617  211 - 911 pg/mL   Comment: Performed at [facility]     Status: None   Collection Time    03/19/14  3:05 AM      Result Value Ref Range   Folate 6.7     Comment: (NOTE)     Reference  Ranges            Deficient:       0.4 - 3.3 ng/mL            Indeterminate:   3.4 - 5.4 ng/mL            Normal:              > 5.4 ng/mL     Performed at [facility] TIBC     Status: Abnormal   Collection Time    03/19/14  3:05 AM      Result Value Ref Range   Iron <10 (*) 42 - 135 ug/dL   TIBC Not calculated due to Iron <10.  250 - 470 ug/dL   Saturation Ratios Not calculated due to Iron <10.  20 - 55 %   UIBC 275  125 - 400 ug/dL   Comment: Performed at [facility]     Status: None   Collection Time    03/19/14  3:05 AM      Result Value Ref Range   Ferritin 17  10 - 291 ng/mL   Comment: Performed at [facility]  RETICULOCYTES     Status: Abnormal   Collection Time    03/19/14  3:05 AM      Result Value Ref Range   Retic Ct Pct 4.3 (*) 0.4 - 3.1 %   RBC. 3.86 (*) 3.87 - 5.11 MIL/uL   Retic Count, Manual 166.0  19.0 - 186.0 K/uL  CBC     Status: Abnormal   Collection Time    03/19/14  3:05 AM      Result Value Ref Range   WBC 12.1 (*) 4.0 - 10.5 K/uL   RBC 3.86 (*) 3.87 - 5.11 MIL/uL   Hemoglobin 7.9 (*) 12.0 - 15.0 g/dL   HCT 26.4 (*) 36.0 - 46.0 %   MCV 68.4 (*) 78.0 - 100.0 fL   MCH 20.5 (*) 26.0 - 34.0 pg   MCHC 29.9 (*) 30.0 - 36.0 g/dL   RDW 18.9 (*) 11.5 - 15.5 %   Platelets 365  150 - 400 K/uL  GLUCOSE, CAPILLARY     Status: Abnormal   Collection Time    03/19/14  7:34 AM      Result Value Ref Range   Glucose-Capillary 141 (*) 70 - 99 mg/dL  OCCULT BLOOD X 1 CARD TO LAB, STOOL     Status: Abnormal   Collection Time    03/19/14  1:13 PM      Result Value Ref Range   Fecal Occult Bld POSITIVE (*) NEGATIVE  GLUCOSE, CAPILLARY     Status: Abnormal   Collection Time    03/19/14  1:27 PM  Result Value Ref Range   Glucose-Capillary 181 (*) 70 - 99 mg/dL  BASIC METABOLIC PANEL     Status: Abnormal   Collection Time    03/19/14  3:56 PM      Result Value Ref Range   Sodium 125 (*) 137 - 147 mEq/L    Potassium 3.3 (*) 3.7 - 5.3 mEq/L   Chloride 90 (*) 96 - 112 mEq/L   CO2 19  19 - 32 mEq/L   Glucose, Bld 241 (*) 70 - 99 mg/dL   BUN 77 (*) 6 - 23 mg/dL   Creatinine, Ser 1.71 (*) 0.50 - 1.10 mg/dL   Calcium 8.0 (*) 8.4 - 10.5 mg/dL   GFR calc non Af Amer 31 (*) >90 mL/min   GFR calc Af Amer 36 (*) >90 mL/min   Comment: (NOTE)     The eGFR has been calculated using the CKD EPI equation.     This calculation has not been validated in all clinical situations.     eGFR's persistently <90 mL/min signify possible Chronic Kidney     Disease.  GLUCOSE, CAPILLARY     Status: Abnormal   Collection Time    03/19/14  5:03 PM      Result Value Ref Range   Glucose-Capillary 238 (*) 70 - 99 mg/dL  GLUCOSE, CAPILLARY     Status: Abnormal   Collection Time    03/19/14  9:25 PM      Result Value Ref Range   Glucose-Capillary 174 (*) 70 - 99 mg/dL   Comment 1 Notify RN    BASIC METABOLIC PANEL     Status: Abnormal   Collection Time    03/20/14  3:41 AM      Result Value Ref Range   Sodium 126 (*) 137 - 147 mEq/L   Potassium 3.4 (*) 3.7 - 5.3 mEq/L   Chloride 90 (*) 96 - 112 mEq/L   CO2 21  19 - 32 mEq/L   Glucose, Bld 174 (*) 70 - 99 mg/dL   BUN 76 (*) 6 - 23 mg/dL   Creatinine, Ser 1.69 (*) 0.50 - 1.10 mg/dL   Calcium 8.1 (*) 8.4 - 10.5 mg/dL   GFR calc non Af Amer 32 (*) >90 mL/min   GFR calc Af Amer 37 (*) >90 mL/min   Comment: (NOTE)     The eGFR has been calculated using the CKD EPI equation.     This calculation has not been validated in all clinical situations.     eGFR's persistently <90 mL/min signify possible Chronic Kidney     Disease.  PROTIME-INR     Status: Abnormal   Collection Time    03/20/14  3:41 AM      Result Value Ref Range   Prothrombin Time 33.6 (*) 11.6 - 15.2 seconds   INR 3.47 (*) 0.00 - 1.49  CBC     Status: Abnormal   Collection Time    03/20/14  3:41 AM      Result Value Ref Range   WBC 10.7 (*) 4.0 - 10.5 K/uL   RBC 4.06  3.87 - 5.11 MIL/uL    Hemoglobin 8.5 (*) 12.0 - 15.0 g/dL   HCT 28.1 (*) 36.0 - 46.0 %   MCV 69.2 (*) 78.0 - 100.0 fL   MCH 20.9 (*) 26.0 - 34.0 pg   MCHC 30.2  30.0 - 36.0 g/dL   RDW 19.2 (*) 11.5 - 15.5 %   Platelets 368  150 -  400 K/uL   No results found.     Medical Problem List and Plan: 1. Functional deficits secondary to Deconditioning after multi-medical iisssues 2.  DVT Prophylaxis/Anticoagulation: Chronic Coumadin therapy for atrial fibrillation. Monitor for any bleeding episodes 3. Pain Management: Hydrocodone as needed. Monitor with increased mobility 4. Mood/depression: Wellbutrin 300 mg daily, Xanax 0.5 mg twice daily as needed. Provide emotional support 5. Neuropsych: This patient is capable of making decisions on his own behalf. 6. Atrial fibrillation. Continue Coumadin therapy as directed. Cardiac rate control. No chest pain or shortness of breath 7. Hypertension. Lasix 40 mg daily, Lopressor 12.5 mg twice a day. Monitor with increased mobility 8. AKI/hyponatremia/hyperkalemia. Followup per renal services. Labs are normalizing. Repeat chemistries. Creatinine baseline 1.32 9. Acute on chronic anemia. Latest hemoglobin 8.6. 10. Chronic leg ulcers. Empiric doxycycline/Cipro. Routine skin care per [facility] nurse 11. Diabetes mellitus with peripheral neuropathy. Hemoglobin A1c 7.2. Lantus insulin 30 units each bedtime. Check CBGs a.c. and at bedtime. 12. Hyperlipidemia. Zocor 13. MRSA PCR screening positive. Contact precautions  Post Admission Physician Evaluation: 1. Functional deficits secondary  to deconditioning related to multiple medical issues. 2. Patient is admitted to receive collaborative, interdisciplinary care between the physiatrist, rehab nursing staff, and therapy team. 3. Patient's level of medical complexity and substantial therapy needs in context of that medical necessity cannot be provided at a lesser intensity of care such as a SNF. 4. Patient has experienced substantial  functional loss from his/her baseline which was documented above under the "Functional History" and "Functional Status" headings.  Judging by the patient's diagnosis, physical exam, and functional history, the patient has potential for functional progress which will result in measurable gains while on inpatient rehab.  These gains will be of substantial and practical use upon discharge  in facilitating mobility and self-care at the household level. 5. Physiatrist will provide 24 hour management of medical needs as well as oversight of the therapy plan/treatment and provide guidance as appropriate regarding the interaction of the two. 6. 24 hour rehab nursing will assist with bladder management, bowel management, safety, skin/wound care, disease management, medication administration, pain management and patient education  and help integrate therapy concepts, techniques,education, etc. 7. PT will assess and treat for/with: Lower extremity strength, range of motion, stamina, balance, functional mobility, safety, adaptive techniques and equipment, pain mgt, edema control.   Goals are: mod I to supervision. 8. OT will assess and treat for/with: ADL's, functional mobility, safety, upper extremity strength, adaptive techniques and equipment, pain mgt, caregiver education.   Goals are: mod I to supervision. 9. SLP will assess and treat for/with: n/a.  Goals are: n/a. 10. Case Management and Social Worker will assess and treat for psychological issues and discharge planning. 11. Team conference will be held weekly to assess progress toward goals and to determine barriers to discharge. 12. Patient will receive at least 3 hours of therapy per day at least 5 days per week. 13. ELOS: 14-18 days       14. Prognosis:  excellent     Meredith Staggers, MD, [place] [facility]   03/20/2014

## 2014-03-22 NOTE — Interval H&P Note (Signed)
Robin [name] was admitted today to Inpatient Rehabilitation with the diagnosis of deconditioning after sepsis/wound infection.  The patient's history has been reviewed, patient examined, and there is no change in status.  Patient continues to be appropriate for intensive inpatient rehabilitation.  I have reviewed the patient's chart and labs.  Questions were answered to the patient's satisfaction.  SWARTZ,ZACHARY T 03/22/2014, 9:23 PM

## 2014-03-22 NOTE — Progress Notes (Signed)
[employer] has approved admit for inpt rehab today. I have notified Dr. Isidoro Donningai, RN CM and SW. [phone]

## 2014-03-22 NOTE — Discharge Summary (Signed)
Physician Discharge Summary  Patient ID: Robin [name] MRN: [id number] DOB/AGE: 1950-11-17 63 y.o.  Admit date: 03/16/2014 Discharge date: 03/22/2014  Primary Care Physician:  [name], MD  Discharge Diagnoses:   . severe Hyponatremia . Hyperkalemia . Acute kidney injury  . DKA (diabetic ketoacidoses) with uncontrolled DM . Sepsis  . Ulcers of both lower legs, chronic venous stasis       Consults:  Nephrology                   CIR    Recommendations for Outpatient Follow-up:    Allergies:  No Known Allergies   Discharge Medications:   Medication List    STOP taking these medications       insulin aspart 100 UNIT/ML injection  Commonly known as:  novoLOG      TAKE these medications       albuterol (2.5 MG/3ML) 0.083% nebulizer solution  Commonly known as:  PROVENTIL  Take 3 mLs (2.5 mg total) by nebulization every 6 (six) hours as needed for wheezing or shortness of breath.     ALPRAZolam 0.25 MG tablet  Commonly known as:  XANAX  Take 1 tablet (0.25 mg total) by mouth 2 (two) times daily as needed for anxiety or sleep.     buPROPion 300 MG 24 hr tablet  Commonly known as:  WELLBUTRIN XL  Take 1 tablet by mouth daily.     ciprofloxacin 500 MG tablet  Commonly known as:  CIPRO  Take 1 tablet (500 mg total) by mouth 2 (two) times daily. X 2 weeks     doxycycline 100 MG tablet  Commonly known as:  VIBRA-TABS  Take 1 tablet (100 mg total) by mouth 2 (two) times daily. X 2 weeks     HYDROcodone-acetaminophen 5-325 MG per tablet  Commonly known as:  NORCO/VICODIN  Take 1 tablet by mouth every 4 (four) hours as needed for moderate pain.     insulin glargine 100 UNIT/ML injection  Commonly known as:  LANTUS  Inject 30 Units into the skin at bedtime.     LASIX PO  Take 40 mg by mouth daily.     metoprolol tartrate 25 MG tablet  Commonly known as:  LOPRESSOR  Take 0.5 tablets (12.5 mg total) by mouth 2 (two) times daily.     neomycin-bacitracin-polymyxin ointment  Commonly known as:  NEOSPORIN  Apply 1 application topically 2 (two) times daily. apply to eye     simvastatin 20 MG tablet  Commonly known as:  ZOCOR  Take 1 tablet by mouth daily.     sodium bicarbonate 650 MG tablet  Take 1 tablet (650 mg total) by mouth 3 (three) times daily.     warfarin 5 MG tablet  Commonly known as:  COUMADIN  Take 5 mg by mouth daily. Take as directed by [facility]         Brief H and P: For complete details please refer to admission H and P, but in brief, Robin [name] is a 63 y.o. female with PMH of HTN, HPL, A fib on AC/couamadin, IDDM, PAD, venous insufficiency chronic leg wounds presented with progressive weakness, worsening of leg ulcers; in ED found to have Hyper K 7.2, hypo Na -111 given IVF/NS, IV insulin+d50%, kayexalate for hyper K;  -patient denies focal neurological symptoms, but tired, fatigue with generalized weakness; no chest pain, no SOB, no cough, no nausea, vomiting or diarrhea    Hospital Course:  63 y.o.  female with PMH of HTN, HLD, A fib on coumadin, IDDM, PAD, and venous insufficiency w/ chronic leg wounds who presented with progressive weakness, and worsening of leg ulcers. In the ED she was found to have a K+ of 7.2, and a Na+ of 111. She was given NS, IV insulin + d50, and kayexalate. She denied focal neurological symptoms, but was fatigued with generalized weakness; no chest pain, no SOB, no cough, no nausea, vomiting or diarrhea. Records indicate that over 1200cc of urine was obtained when a foley catheter was placed. The pt was not aware of urinary retention sx.   Severe hyponatremia : Sodium improving steadily. Nephrology was consulted, patient was initially placed on hypertonic IV bicarbonate, then subsequently transitioned to normal saline with multiple boluses. Sodium however then began to drop again and then Lasix was resumed to encourage free water loss. Patient is currently on  oral sodium bicarbonate. -Sodium now improving, 130 today, nephrology following   Severe hyperkalemia >> severe hypokalemia K+ 7.2 at admit - normalized and then became hypokalemic. Currently resolved at 4.0.  Metabolic acidosis  due to renal failure - improved with bicarb drip, off bicarb drip, slowly declined hence placed on oral bicarb by nephrology   Acute kidney injury  - BUN of 139, creat of 2.28 at presentation - crt was 1.02 in 2011 - etiology unclear  - Nephrology following, renal ultrasound did not show any acute hydronephrosis or obstruction  - per Nephrology office note review, pt is likely now at her recent estimated baseline   Sepsis - B leg ulcers w/ ?cellulitis  Cont abx - sepsis physiology improving,transition to oral doxycycline , added ciprofloxacin , continue for 2 weeks - wound care consult done, patient will need referral to outpatient wound care center,  DM  CBG reasonably controlled - A1c 7.2   Hypotension w/ Hx of HTN  Clinically pt no longer appears to be volume depleted   Microcytic anemia  - Hgb has dropped with hydration - no evidence of blood loss - stool qiuiac +  - Fe level severely low, patient remembered that she had endoscopy per Dr. Ewing Schlein and was told to take Fe  - Nephrology giving IV iron, follow up outpatient with Dr. Ewing Schlein if repeat EGD is indicated      Day of Discharge BP 133/53  Pulse 83  Temp(Src) 98.5 F (36.9 C) (Oral)  Resp 22  Ht 5\' 5"  (1.651 m)  Wt 111.5 kg (245 lb 13 oz)  BMI 40.91 kg/m2  SpO2 99%  Physical Exam:  HEENT: anicteric sclera, PERLA, EOMI  CVS: S1-S2 clear, no murmur rubs or gallops  Chest: clear to auscultation bilaterally, no wheezing, rales or rhonchi  Abdomen: soft nontender, nondistended, normal bowel sounds  Extremities: bilateral lower extremity ulcers in different stages with granulation tissue and sloughing  Neuro: Cranial nerves II-XII intact, no focal neurological deficits   The results of  significant diagnostics from this hospitalization (including imaging, microbiology, ancillary and laboratory) are listed below for reference.    LAB RESULTS: Basic Metabolic Panel:  Recent Labs Lab 03/21/14 0517 03/22/14 0755  NA 130* 130*  K 3.3* 4.0  CL 93* 93*  CO2 21 22  GLUCOSE 109* 120*  BUN 70* 65*  CREATININE 1.50* 1.55*  CALCIUM 8.4 8.4   Liver Function Tests: No results found for this basename: AST, ALT, ALKPHOS, BILITOT, PROT, ALBUMIN,  in the last 168 hours No results found for this basename: LIPASE, AMYLASE,  in the last 168  hours No results found for this basename: AMMONIA,  in the last 168 hours CBC:  Recent Labs Lab 03/16/14 1123  03/21/14 0517 03/22/14 0755  WBC 21.6*  < > 13.2* 12.3*  NEUTROABS 20.2*  --   --   --   HGB 11.0*  < > 8.2* 8.6*  HCT 34.8*  < > 28.1* 30.0*  MCV 65.8*  < > 70.6* 72.1*  PLT 563*  < > 337 361  < > = values in this interval not displayed. Cardiac Enzymes: No results found for this basename: CKTOTAL, CKMB, CKMBINDEX, TROPONINI,  in the last 168 hours BNP: No components found with this basename: POCBNP,  CBG:  Recent Labs Lab 03/21/14 2054 03/22/14 0733  GLUCAP 163* 115*    Significant Diagnostic Studies:  Dg Chest 1 View  03/16/2014   CLINICAL DATA:  Diabetes.  EXAM: CHEST - 1 VIEW  COMPARISON:  None.  FINDINGS: Mediastinum and hilar structures normal. Mild cardiomegaly with normal pulmonary vascularity. Bibasilar subsegmental atelectasis present. Small bilateral pleural effusions cannot be excluded. No acute bony abnormality .  IMPRESSION: 1. Low lung volumes with mild bibasilar atelectasis. Small pleural effusions cannot be excluded.  2. Mild cardiomegaly, no significant pulmonary venous congestion.   Electronically Signed   By: Maisie Fushomas  Register   On: 03/16/2014 18:29   [place] Renal  03/16/2014   CLINICAL DATA:  Acute renal failure.  EXAM: RENAL/URINARY TRACT ULTRASOUND COMPLETE  COMPARISON:  None.  FINDINGS: Right Kidney:   Length: 13.0 cm. Cortical thinning. Right kidney is slightly echogenic suggesting chronic medical renal disease.  Left Kidney:  Length: 12.1 cm. Cortical thinning. Left kidney is echogenic suggesting chronic medical renal disease.  Bladder:  Bladder is slightly distended. Patient unable to void. Bladder volume is 814.9 cc.  This was a limited exam due to patient's body habitus and patient's inability to turn.  IMPRESSION: 1. Renal cortical thinning and increased echogenicity suggesting chronic medical renal disease. No hydronephrosis. 2. Mild bladder distention.  Patient unable to void.   Electronically Signed   By: Maisie Fushomas  Register   On: 03/16/2014 18:10   Dg Foot 2 Views Left  03/16/2014   CLINICAL DATA:  Ulcers.  Rule out osteomyelitis.  EXAM: LEFT FOOT - 2 VIEW  COMPARISON:  None.  FINDINGS: Plantar calcaneal spur. Diffuse vascular calcifications. No acute bony abnormality. No radiographic changes of osteomyelitis. No fracture, subluxation or dislocation.  IMPRESSION: No acute bony abnormality.   Electronically Signed   By: Charlett NoseKevin  Dover M.D.   On: 03/16/2014 18:32   Dg Foot 2 Views Right  03/16/2014   CLINICAL DATA:  Soft tissue ulcers.  EXAM: RIGHT FOOT - 2 VIEW  COMPARISON:  None.  FINDINGS: Mild soft tissue swelling is present. No radiopaque foreign bodies. No acute bony or joint abnormalities. Calcification within the the plantar fascia.Vascular calcification noted. A lucency is noted base of the right third metacarpal. An active process including osteomyelitis cannot be excluded.  IMPRESSION: 1. Focal lucency is seen at the base of the right third metatarsal. Although this may be old an active bone lesion including osteomyelitis cannot be excluded. No acute abnormality otherwise noted.  2. Peripheral vascular calcification indicating peripheral vascular disease.   Electronically Signed   By: Maisie Fushomas  Register   On: 03/16/2014 18:34       Disposition and Follow-up:    DISPOSITION: To inpatient  rehabilitation  DIET carb modified diet    DISCHARGE FOLLOW-UP Follow-up Information   [facility]. Schedule an appointment as soon as possible for a visit in 2 weeks. (for hospital follow-up)    Specialty:  Family Medicine   Contact information:   [address], [address] [place] [address] [phone]       Time spent on Discharge: 45 minutes  Signed:   RAI,RIPUDEEP M.D. [facility] 03/22/2014, 12:32 PM Pager: [phone]   **Disclaimer: This note was dictated with voice recognition software. Similar sounding words can inadvertently be transcribed and this note may contain transcription errors which may not have been corrected upon publication of note.**

## 2014-03-22 NOTE — Progress Notes (Signed)
ANTICOAGULATION CONSULT NOTE - Follow Up Consult  Pharmacy Consult for Coumadin Indication: atrial fibrillation  No Known Allergies  Patient Measurements: Height: 5\' 5"  (165.1 cm) Weight: 245 lb 13 oz (111.5 kg) IBW/kg (Calculated) : 57  Vital Signs: Temp: 98.5 F (36.9 C) (06/19 0914) Temp src: Oral (06/19 0914) BP: 133/53 mmHg (06/19 0914) Pulse Rate: 83 (06/19 0914)  Labs:  Recent Labs  03/20/14 0341 03/21/14 0517 03/22/14 0755  HGB 8.5* 8.2* 8.6*  HCT 28.1* 28.1* 30.0*  PLT 368 337 361  LABPROT 33.6* 25.7* 31.9*  INR 3.47* 2.44* 3.24*  CREATININE 1.69* 1.50* 1.55*    Estimated Creatinine Clearance: 47.4 ml/min (by C-G formula based on Cr of 1.55).  Assessment: [name] on Coumadin for Afib. INR is again above goal after increasing Coumadin dose last PM.  No Coumadin tonight.      - H/H improving, Plts wnl - No significant bleeding reported  - PTA regimen: 5mg  daily - Monitor for drug interaction with Cipro and Doxy  Goal of Therapy:  INR 2-3 Monitor platelets by anticoagulation protocol: Yes   Plan:  1. No Coumadin today  2. F/U AM INR   Toys 'R' UsKimberly Hammons, Pharm.D., BCPS Clinical Pharmacist Pager [phone] 03/22/2014 10:39 AM

## 2014-03-22 NOTE — Progress Notes (Signed)
Occupational Therapy Treatment Patient Details Name: Jewel BaizeJudith M Amster MRN: [id number] DOB: 11/20/1950 Today's Date: 03/22/2014    History of present illness Pt admit with bil LE cellulitis.  Afib.  Pt. with severe hyponatremia now improving.  Pt. initially with hyperkalemia, now hypokalemic.  AKI with generalized weakness.     OT comments  Pt. Awake upon arrival and agreeable to participation in skilled ot.  Requires instructional cues for bed mobility, but was able to initiate rolling into side lying.  Tolerated un supported sitting eob for grooming tasks and stand-pivot transfer to recliner.  Demonstrating initiation with adls and functional transfers.  Very eager for continued therapy and would make a great candidate for CIR level therapies as she is tolerating longer treatment sessions and showing improved physical abilities.   Follow Up Recommendations  CIR    Equipment Recommendations  3 in 1 bedside comode;Hospital bed    Recommendations for Other Services Rehab consult    Precautions / Restrictions Precautions Precautions: Fall       Mobility Bed Mobility Overal bed mobility: Needs Assistance Bed Mobility: Rolling;Sidelying to Sit Rolling: Min assist;Mod assist Sidelying to sit: Min assist       General bed mobility comments: step by step cueing for hand placement on bed rail  Transfers Overall transfer level: Needs assistance     Sit to Stand: Min assist Stand pivot transfers: Mod assist       General transfer comment: min a for sit/stand mod a to initiate pivotal steps. still anxious and requires reasurement to proceed with desired tasks    Balance                                   ADL Overall ADL's : Needs assistance/impaired     Grooming: Brushing hair;Set up;Sitting           Upper Body Dressing : Minimal assistance;Sitting   Lower Body Dressing: Maximal assistance;Sitting/lateral leans;Bed level Lower Body Dressing Details  (indicate cue type and reason): don socks Toilet Transfer: Minimal assistance;Moderate assistance;BSC Toilet Transfer Details (indicate cue type and reason): min a sit/stand with mod inst. and tactile guidance with ue placement on arm rests mod a to initiate pivotal steps. very anxious and required reassurement to ease the process Toileting- Clothing Manipulation and Hygiene: Moderate assistance;Sit to/from stand Toileting - Clothing Manipulation Details (indicate cue type and reason): simulated during pivot transfer     Functional mobility during ADLs: Minimal assistance;Moderate assistance General ADL Comments: able to reach forward while in sitting but can not reach feet at this time for donning socks.  has reacher and would benefit from continued training with a/e for lb dressing.                                                                                              General Comments  has a son that has physical disabilities that her and her husband care for.  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)  Pertinent Vitals/ Pain       No c/o pain                                                          Frequency Min 2X/week     Progress Toward Goals  OT Goals(current goals can now be found in the care plan section)  Progress towards OT goals: Progressing toward goals     Plan Discharge plan remains appropriate                    End of Session     Activity Tolerance Patient tolerated treatment well   Patient Left with call bell/phone within reach             Time: 0716-0803 OT Time Calculation (min): 47 min  Charges: OT General Charges $OT Visit: 1 Procedure OT Treatments $Self Care/Home Management : 38-52 mins  Robet LeuMorris, Fredric Slabach Lorraine, COTA/L 03/22/2014, 8:06 AM

## 2014-03-23 ENCOUNTER — Inpatient Hospital Stay (HOSPITAL_COMMUNITY): Payer: BC Managed Care – PPO | Admitting: Occupational Therapy

## 2014-03-23 ENCOUNTER — Inpatient Hospital Stay (HOSPITAL_COMMUNITY): Payer: BC Managed Care – PPO | Admitting: Physical Therapy

## 2014-03-23 DIAGNOSIS — I4891 Unspecified atrial fibrillation: Secondary | ICD-10-CM

## 2014-03-23 DIAGNOSIS — R5381 Other malaise: Secondary | ICD-10-CM

## 2014-03-23 DIAGNOSIS — A419 Sepsis, unspecified organism: Secondary | ICD-10-CM

## 2014-03-23 DIAGNOSIS — E669 Obesity, unspecified: Secondary | ICD-10-CM

## 2014-03-23 DIAGNOSIS — E1110 Type 2 diabetes mellitus with ketoacidosis without coma: Secondary | ICD-10-CM

## 2014-03-23 DIAGNOSIS — L97909 Non-pressure chronic ulcer of unspecified part of unspecified lower leg with unspecified severity: Secondary | ICD-10-CM

## 2014-03-23 LAB — PROTIME-INR
INR: 2.89 — ABNORMAL HIGH (ref 0.00–1.49)
Prothrombin Time: 29.2 seconds — ABNORMAL HIGH (ref 11.6–15.2)

## 2014-03-23 LAB — GLUCOSE, CAPILLARY
GLUCOSE-CAPILLARY: 116 mg/dL — AB (ref 70–99)
Glucose-Capillary: 132 mg/dL — ABNORMAL HIGH (ref 70–99)
Glucose-Capillary: 115 mg/dL — ABNORMAL HIGH (ref 70–99)
Glucose-Capillary: 173 mg/dL — ABNORMAL HIGH (ref 70–99)

## 2014-03-23 MED ORDER — ALPRAZOLAM 0.25 MG PO TABS
0.2500 mg | ORAL_TABLET | Freq: Three times a day (TID) | ORAL | Status: DC | PRN
  Administered 2014-03-23: 0.25 mg via ORAL
  Filled 2014-03-23: qty 1

## 2014-03-23 MED ORDER — ALPRAZOLAM 0.5 MG PO TABS
0.5000 mg | ORAL_TABLET | Freq: Three times a day (TID) | ORAL | Status: DC | PRN
  Administered 2014-03-23 – 2014-04-01 (×21): 0.5 mg via ORAL
  Filled 2014-03-23 (×21): qty 1

## 2014-03-23 MED ORDER — WARFARIN SODIUM 1 MG PO TABS
1.0000 mg | ORAL_TABLET | Freq: Once | ORAL | Status: AC
  Administered 2014-03-23: 1 mg via ORAL
  Filled 2014-03-23: qty 1

## 2014-03-23 NOTE — Progress Notes (Signed)
ANTICOAGULATION CONSULT NOTE - Follow Up Consult  Pharmacy Consult for Coumadin Indication: atrial fibrillation  No Known Allergies  Patient Measurements: Height: 5\' 6"  (167.6 cm) Weight: 264 lb 8.8 oz (120 kg) IBW/kg (Calculated) : 59.3  Vital Signs: Temp: 98.1 F (36.7 C) (06/20 0500) Temp src: Oral (06/20 0500) BP: 105/71 mmHg (06/20 0500) Pulse Rate: 77 (06/20 0500)  Labs:  Recent Labs  03/21/14 0517 03/22/14 0755 03/23/14 0510  HGB 8.2* 8.6*  --   HCT 28.1* 30.0*  --   PLT 337 361  --   LABPROT 25.7* 31.9* 29.2*  INR 2.44* 3.24* 2.89*  CREATININE 1.50* 1.55*  --     Estimated Creatinine Clearance: 50.3 ml/min (by C-G formula based on Cr of 1.55).  Assessment: [name] on Coumadin for Afib. NR down to 2.89 - coumadin held 6/19. Hg 8.6, plts stable; 6/16 FOB(+). Pt continues on ciprofloxacin and doxycycline which may potentiate effects of coumadin.  Goal of Therapy:  INR 2-3 Monitor platelets by anticoagulation protocol: Yes   Plan:  1. Coumadin 2mg  tonight 2. Daily INR  Christoper Fabianaron Mansa Willers, PharmD, BCPS Clinical pharmacist, pager [phone] 03/23/2014 9:38 AM

## 2014-03-23 NOTE — Plan of Care (Signed)
Problem: RH SKIN INTEGRITY Goal: RH STG MAINTAIN SKIN INTEGRITY WITH ASSISTANCE STG Maintain Skin Integrity With Assistance.  Min assist

## 2014-03-23 NOTE — Progress Notes (Signed)
This nurse sent a11 down to [facility] asked for inter dry cloth they refused and said a wound care nurse would have to call for it  Carried out order except for this item

## 2014-03-23 NOTE — Progress Notes (Signed)
Physical Therapy Session Note  Patient Details  Name: Robin [name] MRN: [id number] Date of Birth: 11/24/1950  Today's Date: 03/23/2014 Time: [id number] Time Calculation (min): 60 min  Short Term Goals: Week 1:  PT Short Term Goal 1 (Week 1): Pt will perform bed mobility with min A.  PT Short Term Goal 2 (Week 1): Pt will perform bed <> w/c tranfers with min A.  PT Short Term Goal 3 (Week 1): Pt will ambulate 25 ft using LRAD with min A.  PT Short Term Goal 4 (Week 1): Pt will negotiate up/down 3 stairs using 2 rails with min A.  PT Short Term Goal 5 (Week 1): Pt will propel w/c 150 ft with supervision.   Skilled Therapeutic Interventions/Progress Updates:   Pt received sitting in w/c, fatigued from OT session but agreeable to therapy. W/c propulsion room > RN station approx 50 ft using BUEs, min A and vc's for technique for improved efficiency/energy conservation. Pt performed all sit <> stand and stand pivot transfers using RW with max A from w/c and mat table. Gait training x 10 ft and x 5 ft using RW with min A. Pt transferred sit > supine on mat with wedge with max A for BLEs, increase in back pain (details below). Pt requires prolonged rest in semi reclined position on mat before transferring supine > sit with total A for BLE and trunk. Unable to locate gel w/c cushion to fit 20 x 16 w/c for pressure relief, therefore pt with standard w/c cushion with single user pressure relief pad provided to pt on acute care. Pt returned to room in w/c, reporting sharp pain on back of thigh. Pt performed sit <> stand from w/c with RW as therapist located and repositioned protruding tube at proximal end of foley catheter that had become loose from patient skin/elastic band. Pt with relief in pain, RN tech and RN notified of catheter issue and pt c/o catheter "pulling." Pt agreeable to sitting up in w/c until dinner and left with all needs within reach.   Therapy Documentation Precautions:   Precautions Precautions: Fall Precaution Comments: Bilateral Una boots Restrictions Weight Bearing Restrictions: No Pain: Pain Assessment Pain Assessment: 0-10 Pain Score: 10-Worst pain ever Pain Type: Acute pain Pain Location: Back Pain Orientation: Lower;Mid Pain Descriptors / Indicators: Aching;Sharp Pain Onset: With Activity (sit <> supine on mat table) Pain Intervention(s): Repositioned  See FIM for current functional status  Therapy/Group: Individual Therapy  Kerney ElbeVarner, Rebecca A 03/23/2014, 4:09 PM

## 2014-03-23 NOTE — Evaluation (Signed)
Physical Therapy Assessment and Plan  Patient Details  Name: Robin [name] MRN: [id number] Date of Birth: [birth year]  PT Diagnosis: Abnormal posture, Difficulty walking, Muscle weakness and Pain in bilateral lower extremities, Decreased functional endurance, Decreased balance Rehab Potential: Good ELOS: 12-14 days   Today's Date: 03/23/2014 Time: 0905-1000 Time Calculation (min): 55 min  Problem List:  Patient Active Problem List   Diagnosis Date Noted  . Physical deconditioning 03/22/2014  . Ulcers of both lower legs, chronic venous stasis 03/21/2014  . Hyperkalemia 03/16/2014  . Hyponatremia 03/16/2014  . DKA (diabetic ketoacidoses) 03/16/2014  . Sepsis 03/16/2014  . Atrial fibrillation 08/07/2013  . Varicose veins of lower extremities with ulcer 10/25/2011  . Varicose veins of lower extremities with other complications 96/[id number]/[id number]  . Venous insufficiency 07/12/2011    Past Medical History:  Past Medical History  Diagnosis Date  . Diabetes mellitus   . Hypertension   . Hyperlipidemia   . Atrial fibrillation   . Sleep apnea   . Cholelithiasis   . CAD (coronary artery disease)     atrial fibrillation   Past Surgical History:  Past Surgical History  Procedure Laterality Date  . Tonsillectomy  1970s  . Hemorroidectomy  1990s  . Choledochal cyst excision  1980s  . Parathyroidectomy    . Endovenous ablation saphenous vein w/ laser  08-02-2011  Right greater saphenous vein    Assessment & Plan Clinical Impression: Patient is a 63 y.o. year old female with recent admission to the hospital on 03/16/2014 with generalized weakness and worsening of leg ulcers. Noted hyperkalemia 7.2 as well as hyponatremic at 111, BUN 139, creatinine 2.28. Patient did receive Kayexalate for hyperkalemia. IV fluid normal saline initiated for hyponatremia. Renal service consulted in relation to acute renal failure. Renal ultrasound with no hydronephrosis. It was felt that severe hyponatremia  possibly related to excess water consumption on top of AKI. Placed on vancomycin and Zosyn for chronic leg ulcers. X-rays of right and left foot showed no evidence of osteomyelitis however there was a focal lucency at the base of the right third metatarsal. Patient transferred to CIR on 03/22/2014 .   Patient currently requires max with mobility secondary to muscle weakness and decreased cardiorespiratoy endurance.  Prior to hospitalization, patient was modified independent  with mobility and lived with Spouse;Son (Son Robin [name] with epilepsy, cognitive deficits) in a House home.  Home access is 3Stairs to enter.  Patient will benefit from skilled PT intervention to maximize safe functional mobility, minimize fall risk and decrease caregiver burden for planned discharge home with intermittent assist.  Anticipate patient will benefit from follow up [facility] at discharge.  PT - End of Session Activity Tolerance: Decreased this session;Tolerates 30+ min activity with multiple rests Endurance Deficit: Yes PT Assessment Rehab Potential: Good Barriers to Discharge: Decreased caregiver support;Inaccessible home environment PT Patient demonstrates impairments in the following area(s): Balance;Endurance;Motor;Pain;Safety;Skin Integrity PT Transfers Functional Problem(s): Bed Mobility;Bed to Chair;Car;Furniture PT Locomotion Functional Problem(s): Ambulation;Wheelchair Mobility;Stairs PT Plan PT Intensity: Minimum of 1-2 x/day ,45 to 90 minutes PT Frequency: 5 out of 7 days PT Duration Estimated Length of Stay: 12-14 days PT Treatment/Interventions: Ambulation/gait training;Discharge planning;DME/adaptive equipment instruction;Functional mobility training;Neuromuscular re-education;Pain management;Patient/family education;Skin care/wound management;Stair training;Therapeutic Activities;Therapeutic Exercise;UE/LE Strength taining/ROM;UE/LE Coordination activities;Wheelchair propulsion/positioning;Balance/vestibular  training PT Transfers Anticipated Outcome(s): mod I PT Locomotion Anticipated Outcome(s): mod I short distances in home/controlled environment, S short distances in community environment PT Recommendation Recommendations for Other Services: Neuropsych consult Follow Up Recommendations: Home health PT Patient destination:  Home Equipment Recommended: To be determined Equipment Details: anticipate pt will need RW and manual w/c at d/c  Skilled Therapeutic Intervention Skilled therapeutic intervention initiated after completion of evaluation. Discussed falls risk, safety within room, and focus of therapy during stay. Discussed possible LOS, goals, and f/u therapy. Pt verbalized understanding.   PT Evaluation Precautions/Restrictions Precautions Precautions: Fall Precaution Comments: Bilateral Una boots Restrictions Weight Bearing Restrictions: No General Chart Reviewed: Yes Family/Caregiver Present: No  Pain Pain Assessment Pain Assessment: No/denies pain Home Living/Prior Functioning Home Living Available Help at Discharge: Family;Available PRN/intermittently Type of Home: House Home Access: Stairs to enter [employer] of Steps: 3 Entrance Stairs-Rails: Right;Left (cannot reach both) Home Layout: Two level;Able to live on main level with bedroom/bathroom Additional Comments: Pt and spouse are main caregivers for son with seizure disorder that requires 24/7 (A). Son is currently at [facility] [employer] for day program.   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  Did not note any significiant cognitive deficits but will continue to further assess in context of funciton. Sensation Sensation Light Touch: Impaired by gross assessment Stereognosis: Appears Intact Hot/Cold: Appears Intact Proprioception: Appears Intact Additional Comments: Sensation intact in bilateral UEs, diminished in bilateral LEs Coordination Gross Motor Movements are Fluid and Coordinated: Yes Fine Motor Movements are Fluid and Coordinated: Yes (Pt with slight shaking in her hands but she reports no difficulty with fastening buttons or opening small objects.) Motor  Motor Motor: Within Functional Limits;Abnormal postural alignment and control Motor - Skilled Clinical Observations: Pt with forward flexed posture at the neck and trunk in standing.  Mobility Bed Mobility Bed Mobility: Supine to Sit;Sitting - Scoot to Edge of Bed Supine to Sit: HOB elevated;With rails;1: +1 Total assist Supine to Sit Details: Tactile cues for sequencing;Manual facilitation for weight shifting;Manual facilitation for placement;Verbal cues for sequencing Supine to Sit Details (indicate cue type and reason): Pt required assistance with moving LEs to the edge of the bed as well as transitioning her trunk into sitting and then scooting to the edge of the bed.   Sitting - Scoot to Edge of Bed: 1: +1 Total assist Sitting - Scoot to Edge of Bed Details: Manual facilitation for weight shifting;Verbal cues for technique;Verbal cues for sequencing Sitting - Scoot to Edge  of Bed Details (indicate cue type and reason): Pt needed total assist to scoot from the middle of the deflated hospital bed to the edge.  Therapist eventually re-inflated the bed to assist with this task. Transfers Transfers: Yes Sit to Stand: 2: Max assist;With armrests Sit to Stand Details: Verbal cues for technique;Manual facilitation for weight shifting;Verbal cues for sequencing Locomotion  Ambulation Ambulation: Yes Ambulation/Gait Assistance: 3: Mod assist Ambulation Distance (Feet): 8 Feet Assistive device: Rolling walker Gait Gait: Yes Gait Pattern: Impaired Gait Pattern: Step-to pattern;Decreased step length - left;Shuffle;Trunk  flexed Gait velocity: decreased Stairs / Additional Locomotion Stairs: No Architect: Yes Wheelchair Assistance: 4: Advertising account executive Details: Verbal cues for Marketing executive: Both upper extremities Wheelchair Parts Management: Needs assistance Distance: 30  Trunk/Postural Assessment  Cervical Assessment Cervical Assessment: Exceptions to [facility] Cervical Strength Overall Cervical Strength Comments: forward head, able to achieve neutral with cuing Thoracic Assessment Thoracic Assessment: Exceptions to [facility] Thoracic Strength Overall Thoracic Strength Comments: kyphotic Lumbar Assessment Lumbar Assessment: Exceptions to [facility] (posterior pelvic tilt) Postural Control Postural Control: Deficits on evaluation Postural Limitations: Depends heavily on UEs  Balance Balance Balance Assessed: Yes Static Standing Balance Static Standing - Balance Support: Right upper extremity supported;Left upper extremity supported Static Standing - Level of Assistance: 4: Min assist Extremity Assessment  RUE Assessment RUE Assessment: Exceptions to [facility] RUE Strength RUE Overall Strength Comments: AROM shoulder flexion 0-120 degrees bilaterally, all other joints AROM WFLs, strength 4/5 throughout. LUE  Assessment LUE Assessment: Exceptions to [facility] LUE AROM (degrees) LUE Overall AROM Comments: AROM shoulder flexion 0-120 degrees bilaterally, all other joints AROM WFLs, shoulder strength 3+/5 throughout, all other joints 4/5. RLE Assessment RLE Assessment: Exceptions to [facility] RLE Strength RLE Overall Strength: Deficits RLE Overall Strength Comments: generalized weakness, 3+ to 4-/5 throughout, difficult to assess ankle DF/PF and knee flex (painful calf) due to UNA boot LLE Assessment LLE Assessment: Exceptions to [facility] LLE Strength LLE Overall Strength: Deficits LLE Overall Strength Comments: generalized weakness L > R, 4- to 4/5 throughout, difficult to assess ankle DF/PF and knee flex (painful calf) due to UNA boot  FIM:  FIM - Bed/Chair Transfer Bed/Chair Transfer: 1: Supine > Sit: Total A (helper does all/Pt. < 25%);2: Bed > Chair or W/C: Max A (lift and lower assist) FIM - Locomotion: Wheelchair Distance: 30 Locomotion: Wheelchair: 1: Travels less than 50 ft with minimal assistance (Pt.>75%) FIM - Locomotion: Ambulation Locomotion: Ambulation Assistive Devices: Administrator Ambulation/Gait Assistance: 3: Mod assist Locomotion: Ambulation: 1: Two helpers FIM - Locomotion: Stairs Locomotion: Stairs: 0: Activity did not occur   Refer to Care Plan for Long Term Goals  Recommendations for other services: Neuropsych  Discharge Criteria: Patient will be discharged from PT if patient refuses treatment 3 consecutive times without medical reason, if treatment goals not met, if there is a change in medical status, if patient makes no progress towards goals or if patient is discharged from hospital.  The above assessment, treatment plan, treatment alternatives and goals were discussed and mutually agreed upon: by patient  Laretta Alstrom 03/23/2014, 1:01 PM

## 2014-03-23 NOTE — Evaluation (Signed)
Occupational Therapy Assessment and Plan  Patient Details  Name: Robin [name] MRN: [id number] Date of Birth: [birth year]  OT Diagnosis: abnormal posture, acute pain, lumbago (low back pain) and muscle weakness (generalized) Rehab Potential: Rehab Potential: Good ELOS: 12 days   Today's Date: 03/23/2014 Time: 0800-0905 Time Calculation (min): 65 min  Problem List:  Patient Active Problem List   Diagnosis Date Noted  . Physical deconditioning 03/22/2014  . Ulcers of both lower legs, chronic venous stasis 03/21/2014  . Hyperkalemia 03/16/2014  . Hyponatremia 03/16/2014  . DKA (diabetic ketoacidoses) 03/16/2014  . Sepsis 03/16/2014  . Atrial fibrillation 08/07/2013  . Varicose veins of lower extremities with ulcer 10/25/2011  . Varicose veins of lower extremities with other complications 00/37/0488  . Venous insufficiency 07/12/2011    Past Medical History:  Past Medical History  Diagnosis Date  . Diabetes mellitus   . Hypertension   . Hyperlipidemia   . Atrial fibrillation   . Sleep apnea   . Cholelithiasis   . CAD (coronary artery disease)     atrial fibrillation   Past Surgical History:  Past Surgical History  Procedure Laterality Date  . Tonsillectomy  1970s  . Hemorroidectomy  1990s  . Choledochal cyst excision  1980s  . Parathyroidectomy    . Endovenous ablation saphenous vein w/ laser  08-02-2011  Right greater saphenous vein    Assessment & Plan Clinical Impression: Patient is a 63 y.o. year old female with recent admission to the hospital on 03/16/2014 with generalized weakness and worsening of leg ulcers. Noted hyperkalemia 7.2 as well as hyponatremic at 111, BUN 139, creatinine 2.28. Patient did receive Kayexalate for hyperkalemia. IV fluid normal saline initiated for hyponatremia. Renal service consulted in relation to acute renal failure. Renal ultrasound with no hydronephrosis. It was felt that severe hyponatremia possibly related to excess water  consumption on top of AKI. Placed on vancomycin and Zosyn for chronic leg ulcers. X-rays of right and left foot showed no evidence of osteomyelitis however there was a focal lucency at the base of the right third metatarsal.  Patient transferred to CIR on 03/22/2014 .    Patient currently requires max with basic self-care skills secondary to muscle weakness and muscle joint tightness and decreased standing balance and decreased postural control.  Prior to hospitalization, patient could complete ADLs with modified independence however she did require some assistance with LB dressing tasks.  Patient will benefit from skilled intervention to increase independence with basic self-care skills prior to discharge home with care partner.  Anticipate patient will require intermittent supervision and follow up home health.  OT - End of Session Activity Tolerance: Tolerates 10 - 20 min activity with multiple rests Endurance Deficit: Yes OT Assessment Rehab Potential: Good Barriers to Discharge: Decreased caregiver support Barriers to Discharge Comments: Pt's husband works days and is not available. OT Patient demonstrates impairments in the following area(s): Endurance;Motor;Pain;Safety OT Basic ADL's Functional Problem(s): Grooming;Bathing;Dressing;Toileting OT Advanced ADL's Functional Problem(s): Simple Meal Preparation OT Transfers Functional Problem(s): Toilet;Tub/Shower OT Plan OT Intensity: Minimum of 1-2 x/day, 45 to 90 minutes OT Frequency: 5 out of 7 days OT Duration/Estimated Length of Stay: 12 days OT Treatment/Interventions: Balance/vestibular training;DME/adaptive equipment instruction;Community reintegration;Discharge planning;Functional mobility training;Psychosocial support;Patient/family education;Therapeutic Activities;UE/LE Strength taining/ROM;UE/LE Coordination activities;Pain management;Neuromuscular re-education;Self Care/advanced ADL retraining;Therapeutic Exercise OT Self Feeding  Anticipated Outcome(s): independent OT Basic Self-Care Anticipated Outcome(s): modified independent OT Toileting Anticipated Outcome(s): modified independent OT Bathroom Transfers Anticipated Outcome(s): modified independent OT Recommendation Patient destination: Home Follow Up  Recommendations: Home health OT Equipment Recommended: Tub/shower bench Equipment Details: will need tub bench that fastens to the edge of the tub  OT Evaluation Precautions/Restrictions  Precautions Precautions: Fall Precaution Comments: Bilateral Una boots Restrictions Weight Bearing Restrictions: No  Pain Pain Assessment Pain Assessment: Faces Pain Score: 4  Faces Pain Scale: Hurts little more Pain Type: Acute pain Pain Location: Leg Pain Orientation: Right;Left Pain Intervention(s): Repositioned;RN made aware;Medication (See eMAR) Multiple Pain Sites: No Home Living/Prior Functioning Home Living Available Help at Discharge: Family;Available PRN/intermittently Type of Home: House Home Access: Stairs to enter [employer] of Steps: 3 Entrance Stairs-Rails: Right;Left (cannot reach both) Home Layout: Two level;Able to live on main level with bedroom/bathroom Additional Comments: Pt and spouse are main caregivers for son with seizure disorder that requires 24/7 (A). Son is currently at [facility] [employer] for day program.   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 Leisure: Hobbies-yes (Comment) Comments: going out to eat, movies ADL  See FIM scale for details  Vision/Perception  Vision- History Baseline Vision/History: Wears  glasses Wears Glasses: At all times Patient Visual Report: No change from baseline Vision- Assessment Vision Assessment?: No apparent visual deficits  Cognition Overall Cognitive Status: Within Functional Limits for tasks assessed Arousal/Alertness: Awake/alert Orientation Level: Oriented X4 Memory: Appears intact Awareness: Appears intact Behaviors: Other (comment) (anxious) Safety/Judgment: Appears intact Comments: Pt slightly anxious of falling so apprehensive with sit to stand and functional transfers.  Did not note any significiant cognitive deficits but will continue to further assess in context of funciton. Sensation Sensation Light Touch: Appears Intact Stereognosis: Appears Intact Hot/Cold: Appears Intact Proprioception: Appears Intact Additional Comments: Sensation intact in bilateral UEs Coordination Gross Motor Movements are Fluid and Coordinated: Yes Fine Motor Movements are Fluid and Coordinated: Yes (Pt with slight shaking in her hands but she reports no difficulty with fastening buttons or opening small objects.) Motor  Motor Motor: Within Functional Limits;Abnormal postural alignment and control Motor - Skilled Clinical Observations: Pt with slightly flexed posture at the neck and trunk in standing.  Currently taking small steps with functional transfers. Mobility  Bed Mobility Bed Mobility: Supine to Sit;Sitting - Scoot to Edge of Bed Supine to Sit: HOB elevated;With rails;1: +1 Total assist Supine to Sit Details: Tactile cues for sequencing;Manual facilitation for weight shifting;Manual facilitation for placement;Verbal cues for sequencing Supine to Sit Details (indicate cue type and reason): Pt required assistance with moving LEs to the edge of the bed as well as transitioning her trunk into sitting and then scooting to the edge of the bed.   Sitting - Scoot to Edge of Bed: 1: +1 Total assist Sitting - Scoot to Edge of Bed Details: Manual facilitation for weight  shifting;Verbal cues for technique;Verbal cues for sequencing Sitting - Scoot to Edge of Bed Details (indicate cue type and reason): Pt needed total assist to scoot from the middle of the deflated hospital bed to the edge.  Therapist eventually re-inflated the bed to assist with this task.  Trunk/Postural Assessment  Cervical Assessment Cervical Assessment: Exceptions to [facility] Cervical Strength Overall Cervical Strength Comments: Pt maintained some cervical flexion in standing but is able to achieve neutral cervical extension with cueing. Thoracic Assessment Thoracic Assessment: Exceptions  to [facility] Thoracic Strength Overall Thoracic Strength Comments: slight kyphosis in standing Postural Control Postural Control: Deficits on evaluation Postural Limitations: Pt sits with rounded shoulders and posterior pelvic tilt in sitting.  In standing also with slight kyphosis and cervical flexion.  Balance Balance Balance Assessed: Yes Static Standing Balance Static Standing - Balance Support: Right upper extremity supported;Left upper extremity supported Static Standing - Level of Assistance: 4: Min assist Extremity/Trunk Assessment RUE Assessment RUE Assessment: Exceptions to [facility] RUE Strength RUE Overall Strength Comments: AROM shoulder flexion 0-120 degrees bilaterally, all other joints AROM WFLs, strength 4/5 throughout. LUE Assessment LUE Assessment: Exceptions to [facility] LUE AROM (degrees) LUE Overall AROM Comments: AROM shoulder flexion 0-120 degrees bilaterally, all other joints AROM WFLs, shoulder strength 3+/5 throughout, all other joints 4/5.  FIM:  FIM - Eating Eating Activity: 7: Complete independence:no helper FIM - Grooming Grooming Steps: Wash, rinse, dry face;Oral care, brush teeth, clean dentures;Wash, rinse, dry hands;Brush, comb hair Grooming: 5: Supervision: safety issues or verbal cues FIM - Bathing Bathing Steps Patient Completed: Chest;Right Arm;Left Arm;Abdomen;Front perineal  area;Right upper leg;Left upper leg Bathing: 4: Min-Patient completes 8-9 [id number] parts or 75+ percent FIM - Upper Body Dressing/Undressing Upper body dressing/undressing: 0: Wears gown/pajamas-no public clothing FIM - Lower Body Dressing/Undressing Lower body dressing/undressing: 1: Total-Patient completed less than 25% of tasks FIM - Toileting Toileting: 1: Total-Patient completed zero steps, helper did all 3 FIM - Bed/Chair Transfer Bed/Chair Transfer: 1: Supine > Sit: Total A (helper does all/Pt. < 25%);2: Bed > Chair or W/C: Max A (lift and lower assist) FIM - TAir cabin crewTransfers: 0-Activity did not occur FIM - Tub/Shower Transfers Tub/shower Transfers: 0-Activity did not occur or was simulated   Refer to Care Plan for Long Term Goals  Recommendations for other services: None  Discharge Criteria: Patient will be discharged from OT if patient refuses treatment 3 consecutive times without medical reason, if treatment goals not met, if there is a change in medical status, if patient makes no progress towards goals or if patient is discharged from hospital.  The above assessment, treatment plan, treatment alternatives and goals were discussed and mutually agreed upon: by patient  Pt utilized RW for stand pivot transfers from the bed to wheelchair in order to transition over to the sink for selfcare tasks.  Pt very happy to be out of the bed.  Needed max assist for initial sit to stand from the bed and from the wheelchair when attempting peri care but progressed to mod assist on third attempt as therapist assisted with pulling underpants over her hips.    MCGUIRE,JAMES OTR/L 03/23/2014, 11:26 AM

## 2014-03-23 NOTE — Progress Notes (Signed)
Occupational Therapy Session Note  Patient Details  Name: Robin [name] MRN: [id number] Date of Birth: 03/02/1951  Today's Date: 03/23/2014 Time: [id number] Time Calculation (min): 32 min  Short Term Goals: Week 1:  OT Short Term Goal 1 (Week 1): Pt will perform LB bathing sit to stand with supervision. OT Short Term Goal 2 (Week 1): Pt will perform LB dressing sit to stand with supervision using AE. OT Short Term Goal 3 (Week 1): Pt will perform toilet transfers with supervision to elevated toilet or 3:1. OT Short Term Goal 4 (Week 1): Pt will perform 2 grooming tasks in standing with supervision. OT Short Term Goal 5 (Week 1): Pt will transfer from supine with HOB elevated with min assist in preparation for selfcare tasks.   Skilled Therapeutic Interventions/Progress Updates:    Pt practiced toilet transfers and toileting with mod assist using the RW.  She was able to ambulate in small steps with increased time to the 3:1 over the toilet with mod assist.  Two standing rest breaks needed for her to complete the transfer.  Therapist had to assist with clothing management as she could not remove her hands from the walker to push her underpants down this session.    Therapy Documentation Precautions:  Precautions Precautions: Fall Precaution Comments: Bilateral Una boots Restrictions Weight Bearing Restrictions: No  Vital Signs: Therapy Vitals Temp: 98.4 F (36.9 C) Temp src: Oral Pulse Rate: 70 Resp: 18 BP: 134/84 mmHg Patient Position (if appropriate): Sitting Oxygen Therapy SpO2: 97 % O2 Device: None (Room air) Pain: Pain Assessment Pain Assessment: 0-10 Pain Score: 10-Worst pain ever Pain Type: Acute pain Pain Location: Back Pain Orientation: Lower;Mid Pain Descriptors / Indicators: Aching;Sharp Pain Onset: With Activity (sit <> supine on mat table) Pain Intervention(s): Repositioned ADL: See FIM for current functional status  Therapy/Group: Individual  Therapy  MCGUIRE,JAMES OTR/L 03/23/2014, 4:15 PM

## 2014-03-23 NOTE — Progress Notes (Signed)
[place] PHYSICAL MEDICINE & REHABILITATION     PROGRESS NOTE    Subjective/Complaints: Had some anxiety overnight and this morning. Took xanax last night.   Objective: Vital Signs: Blood pressure 105/71, pulse 77, temperature 98.1 F (36.7 C), temperature source Oral, resp. rate 18, height 5\' 6"  (1.676 m), weight 120 kg (264 lb 8.8 oz), SpO2 96.00%. No results found.  Recent Labs  03/21/14 0517 03/22/14 0755  WBC 13.2* 12.3*  HGB 8.2* 8.6*  HCT 28.1* 30.0*  PLT 337 361    Recent Labs  03/21/14 0517 03/22/14 0755  NA 130* 130*  K 3.3* 4.0  CL 93* 93*  GLUCOSE 109* 120*  BUN 70* 65*  CREATININE 1.50* 1.55*  CALCIUM 8.4 8.4   CBG (last 3)   Recent Labs  03/22/14 1640 03/22/14 2109 03/23/14 0638  GLUCAP 162* 142* 116*    Wt Readings from Last 3 Encounters:  03/22/14 120 kg (264 lb 8.8 oz)  03/20/14 111.5 kg (245 lb 13 oz)  10/25/11 134.265 kg (296 lb)    Physical Exam:  Constitutional: She is oriented to person, place, and time. obese  HENT: oral mucosa pink and moist  Head: Normocephalic.  Eyes: EOM are normal.  Neck: Normal range of motion. Neck supple. No thyromegaly present.  Cardiovascular:  Cardiac rate controlled. No murmur  Respiratory: Effort normal and breath sounds normal. No respiratory distress.  GI: Soft. Bowel sounds are normal. She exhibits no distension.  Musculoskeletal:  1+ bilateral LE , bilateral unna dressings in place Neurological: She is alert and oriented to person, place, and time.  Follows full commands. Alert and appropriate. No sensory deficits except for the feet which remain dimnished to light touch. UE's grossly 4 to 4+/5 deltoid, biceps, triceps, HI. LE's 3- HF, 3- KE and 3+/5 ankles. DTR's 1+  Skin:  Bilateral lower extremities with dry dressing in place. Scattered ecchymoses in UE's. A few abrasions areas over toes are visible beyond unna's. Stage 2 gluteal wound (left) with surrounding excoriated  skin. Psychiatric: She is a little anxious but generally appropriate and pleasant Assessment/Plan: 1. Functional deficits secondary to deconditioning after cellulitis/sepsis which require 3+ hours per day of interdisciplinary therapy in a comprehensive inpatient rehab setting. Physiatrist is providing close team supervision and 24 hour management of active medical problems listed below. Physiatrist and rehab team continue to assess barriers to discharge/monitor patient progress toward functional and medical goals. FIM:                   Comprehension Comprehension Mode: Auditory Comprehension: 7-Follows complex conversation/direction: With no assist  Expression Expression Mode: Verbal Expression: 7-Expresses complex ideas: With no assist  Social Interaction Social Interaction: 7-Interacts appropriately with others - No medications needed.  Problem Solving Problem Solving: 7-Solves complex problems: Recognizes & self-corrects  Memory Memory: 7-Complete Independence: No helper Medical Problem List and Plan:  1. Functional deficits secondary to Deconditioning after multi-medical iisssues  2. DVT Prophylaxis/Anticoagulation: Chronic Coumadin therapy for atrial fibrillation. Monitor for any bleeding episodes  3. Pain Management: Hydrocodone as needed. Monitor with increased mobility  4. Mood/depression: Wellbutrin 300 mg daily, increase xanax to 0.5mg  tid prn. Provide emotional support --generally in good spirits.  5. Neuropsych: This patient is capable of making decisions on his own behalf.  6. Atrial fibrillation. Continue Coumadin therapy as directed. Cardiac rate control. No chest pain or shortness of breath at present 7. Hypertension. Lasix 40 mg daily, Lopressor 12.5 mg twice a day. Monitor with increased  mobility  8. AKI/hyponatremia/hyperkalemia. Followup per renal services. Labs are normalizing. Repeat chemistries. Creatinine baseline 1.32 --labs improving 9. Acute on  chronic anemia. Latest hemoglobin 8.6.  10. Chronic leg ulcers. Empiric doxycycline/Cipro. [facility]  11. Diabetes mellitus with peripheral neuropathy. Hemoglobin A1c 7.2. Lantus insulin 30 units each bedtime. Check CBGs a.c. and at bedtime.   -sugars acceptable 12. Hyperlipidemia. Zocor  13. MRSA PCR screening positive. Contact precautions      LOS (Days) 1 A FACE TO FACE EVALUATION WAS PERFORMED  SWARTZ,ZACHARY T 03/23/2014 8:17 AM

## 2014-03-24 ENCOUNTER — Inpatient Hospital Stay (HOSPITAL_COMMUNITY): Payer: BC Managed Care – PPO

## 2014-03-24 DIAGNOSIS — E669 Obesity, unspecified: Secondary | ICD-10-CM

## 2014-03-24 DIAGNOSIS — E1110 Type 2 diabetes mellitus with ketoacidosis without coma: Secondary | ICD-10-CM

## 2014-03-24 DIAGNOSIS — L97909 Non-pressure chronic ulcer of unspecified part of unspecified lower leg with unspecified severity: Secondary | ICD-10-CM

## 2014-03-24 DIAGNOSIS — A419 Sepsis, unspecified organism: Secondary | ICD-10-CM

## 2014-03-24 DIAGNOSIS — R5381 Other malaise: Secondary | ICD-10-CM

## 2014-03-24 DIAGNOSIS — I4891 Unspecified atrial fibrillation: Secondary | ICD-10-CM

## 2014-03-24 LAB — GLUCOSE, CAPILLARY
Glucose-Capillary: 108 mg/dL — ABNORMAL HIGH (ref 70–99)
Glucose-Capillary: 145 mg/dL — ABNORMAL HIGH (ref 70–99)
Glucose-Capillary: 195 mg/dL — ABNORMAL HIGH (ref 70–99)
Glucose-Capillary: 174 mg/dL — ABNORMAL HIGH (ref 70–99)

## 2014-03-24 LAB — PROTIME-INR
INR: 2.49 — ABNORMAL HIGH (ref 0.00–1.49)
Prothrombin Time: 26.1 seconds — ABNORMAL HIGH (ref 11.6–15.2)

## 2014-03-24 MED ORDER — WARFARIN SODIUM 2.5 MG PO TABS
2.5000 mg | ORAL_TABLET | Freq: Once | ORAL | Status: AC
  Administered 2014-03-24: 2.5 mg via ORAL
  Filled 2014-03-24: qty 1

## 2014-03-24 NOTE — Progress Notes (Signed)
ANTICOAGULATION CONSULT NOTE - Follow Up Consult  Pharmacy Consult for Coumadin Indication: atrial fibrillation  No Known Allergies  Patient Measurements: Height: 5\' 6"  (167.6 cm) Weight: 264 lb 8.8 oz (120 kg) IBW/kg (Calculated) : 59.3  Vital Signs: Temp: 98.8 F (37.1 C) (06/21 0535) Temp src: Oral (06/21 0535) BP: 128/68 mmHg (06/21 0535) Pulse Rate: 69 (06/21 0535)  Labs:  Recent Labs  03/22/14 0755 03/23/14 0510 03/24/14 0408  HGB 8.6*  --   --   HCT 30.0*  --   --   PLT 361  --   --   LABPROT 31.9* 29.2* 26.1*  INR 3.24* 2.89* 2.49*  CREATININE 1.55*  --   --     Estimated Creatinine Clearance: 50.3 ml/min (by C-G formula based on Cr of 1.55).  Assessment: [name] on Coumadin for Afib. INR down to 2.49 - coumadin held 6/19. Hg 8.6, plts stable; 6/16 FOB(+). Pt continues on ciprofloxacin and doxycycline which may potentiate effects of coumadin.  Goal of Therapy:  INR 2-3 Monitor platelets by anticoagulation protocol: Yes   Plan:  1. Coumadin 2.5mg  tonight 2. Daily INR  Christoper Fabianaron Amend, PharmD, BCPS Clinical pharmacist, pager [phone] 03/24/2014 9:54 AM

## 2014-03-24 NOTE — Progress Notes (Signed)
[place] PHYSICAL MEDICINE & REHABILITATION     PROGRESS NOTE    Subjective/Complaints: Had a good night. Slept very well between xanax and being tired from therapies.   Objective: Vital Signs: Blood pressure 128/68, pulse 69, temperature 98.8 F (37.1 C), temperature source Oral, resp. rate 18, height 5\' 6"  (1.676 m), weight 120 kg (264 lb 8.8 oz), SpO2 97.00%. No results found.  Recent Labs  03/22/14 0755  WBC 12.3*  HGB 8.6*  HCT 30.0*  PLT 361    Recent Labs  03/22/14 0755  NA 130*  K 4.0  CL 93*  GLUCOSE 120*  BUN 65*  CREATININE 1.55*  CALCIUM 8.4   CBG (last 3)   Recent Labs  03/23/14 1630 03/23/14 2106 03/24/14 0747  GLUCAP 115* 173* 108*    Wt Readings from Last 3 Encounters:  03/22/14 120 kg (264 lb 8.8 oz)  03/20/14 111.5 kg (245 lb 13 oz)  10/25/11 134.265 kg (296 lb)    Physical Exam:  Constitutional: She is oriented to person, place, and time. obese  HENT: oral mucosa pink and moist  Head: Normocephalic.  Eyes: EOM are normal.  Neck: Normal range of motion. Neck supple. No thyromegaly present.  Cardiovascular:  Cardiac rate controlled. No murmur  Respiratory: Effort normal and breath sounds normal. No respiratory distress.  GI: Soft. Bowel sounds are normal. She exhibits no distension.  Musculoskeletal:  1+ bilateral LE , bilateral unna dressings in place Neurological: She is alert and oriented to person, place, and time.  Follows full commands. Alert and appropriate. No sensory deficits except for the feet which remain dimnished to light touch. UE's grossly 4 to 4+/5 deltoid, biceps, triceps, HI. LE's 3- HF, 3- KE and 3+/5 ankles. DTR's 1+  Skin:  Bilateral lower extremities with dry dressing in place. Scattered ecchymoses in UE's. A few abrasions areas over toes are visible beyond unna's. Stage 2 gluteal wound (left) with surrounding excoriated skin. Psychiatric: She is a little anxious but generally appropriate and  pleasant Assessment/Plan: 1. Functional deficits secondary to deconditioning after cellulitis/sepsis which require 3+ hours per day of interdisciplinary therapy in a comprehensive inpatient rehab setting. Physiatrist is providing close team supervision and 24 hour management of active medical problems listed below. Physiatrist and rehab team continue to assess barriers to discharge/monitor patient progress toward functional and medical goals. FIM: FIM - Bathing Bathing Steps Patient Completed: Chest;Right Arm;Left Arm;Abdomen;Front perineal area;Right upper leg;Left upper leg Bathing: 4: Min-Patient completes 8-9 [id number] 10 parts or 75+ percent  FIM - Upper Body Dressing/Undressing Upper body dressing/undressing: 0: Wears gown/pajamas-no public clothing FIM - Lower Body Dressing/Undressing Lower body dressing/undressing: 1: Total-Patient completed less than 25% of tasks  FIM - Toileting Toileting: 1: Total-Patient completed zero steps, helper did all 3  FIM - Diplomatic Services operational officerToilet Transfers Toilet Transfers Assistive Devices: Building control surveyorBedside commode;Walker Toilet Transfers: 3-To toilet/BSC: Mod A (lift or lower assist);3-From toilet/BSC: Mod A (lift or lower assist)  FIM - Bed/Chair Transfer Bed/Chair Transfer Assistive Devices: Walker Bed/Chair Transfer: 1: Supine > Sit: Total A (helper does all/Pt. < 25%);2: Bed > Chair or W/C: Max A (lift and lower assist);2: Sit > Supine: Max A (lifting assist/Pt. 25-49%);2: Chair or W/C > Bed: Max A (lift and lower assist)  FIM - Locomotion: Wheelchair Distance: 30 Locomotion: Wheelchair: 1: Travels less than 50 ft with minimal assistance (Pt.>75%) FIM - Locomotion: Ambulation Locomotion: Ambulation Assistive Devices: Designer, industrial/productWalker - Rolling Ambulation/Gait Assistance: 3: Mod assist Locomotion: Ambulation: 1: Two helpers (w/c follow for  safety)  Comprehension Comprehension Mode: Auditory Comprehension: 7-Follows complex conversation/direction: With no  assist  Expression Expression Mode: Verbal Expression: 7-Expresses complex ideas: With no assist  Social Interaction Social Interaction: 7-Interacts appropriately with others - No medications needed.  Problem Solving Problem Solving: 7-Solves complex problems: Recognizes & self-corrects  Memory Memory: 7-Complete Independence: No helper Medical Problem List and Plan:  1. Functional deficits secondary to Deconditioning after multi-medical iisssues  2. DVT Prophylaxis/Anticoagulation: Chronic Coumadin therapy for atrial fibrillation. Monitor for any bleeding episodes  3. Pain Management: Hydrocodone as needed. Monitor with increased mobility  4. Mood/depression: Wellbutrin 300 mg daily, xanax to 0.5mg  tid prn.  5. Neuropsych: This patient is capable of making decisions on his own behalf.  6. Atrial fibrillation. Continue Coumadin therapy as directed. Cardiac rate control. No chest pain or shortness of breath at present 7. Hypertension. Lasix 40 mg daily, Lopressor 12.5 mg twice a day. Monitor with increased mobility  8. AKI/hyponatremia/hyperkalemia. Followup per renal services. Labs are normalizing.   Creatinine baseline 1.32 --labs improving  -recheck labs in am 9. Acute on chronic anemia. Latest hemoglobin 8.6.  10. Chronic leg ulcers. Empiric doxycycline/Cipro. [facility]  -may be able to dc unna's soon  11. Diabetes mellitus with peripheral neuropathy. Hemoglobin A1c 7.2. Lantus insulin 30 units each bedtime. Check CBGs a.c. and at bedtime.   -sugars acceptable 12. Hyperlipidemia. Zocor  13. MRSA PCR screening positive. Contact precautions      LOS (Days) 2 A FACE TO FACE EVALUATION WAS PERFORMED  SWARTZ,ZACHARY T 03/24/2014 8:08 AM

## 2014-03-25 ENCOUNTER — Inpatient Hospital Stay (HOSPITAL_COMMUNITY): Payer: BC Managed Care – PPO | Admitting: Rehabilitation

## 2014-03-25 ENCOUNTER — Encounter (HOSPITAL_COMMUNITY): Payer: BC Managed Care – PPO | Admitting: Occupational Therapy

## 2014-03-25 ENCOUNTER — Inpatient Hospital Stay (HOSPITAL_COMMUNITY): Payer: BC Managed Care – PPO | Admitting: Speech Pathology

## 2014-03-25 DIAGNOSIS — R5381 Other malaise: Secondary | ICD-10-CM

## 2014-03-25 LAB — COMPREHENSIVE METABOLIC PANEL
ALT: 7 U/L (ref 0–35)
AST: 11 U/L (ref 0–37)
Albumin: 2.3 g/dL — ABNORMAL LOW (ref 3.5–5.2)
Alkaline Phosphatase: 56 U/L (ref 39–117)
BUN: 63 mg/dL — ABNORMAL HIGH (ref 6–23)
CHLORIDE: 96 meq/L (ref 96–112)
CO2: 23 meq/L (ref 19–32)
Calcium: 8.2 mg/dL — ABNORMAL LOW (ref 8.4–10.5)
Creatinine, Ser: 1.8 mg/dL — ABNORMAL HIGH (ref 0.50–1.10)
GFR calc Af Amer: 34 mL/min — ABNORMAL LOW (ref >90)
GFR, EST NON AFRICAN AMERICAN: 29 mL/min — AB (ref >90)
GLUCOSE: 108 mg/dL — AB (ref 70–99)
Potassium: 3.7 mEq/L (ref 3.7–5.3)
Sodium: 133 mEq/L — ABNORMAL LOW (ref 137–147)
Total Bilirubin: 0.3 mg/dL (ref 0.3–1.2)
Total Protein: 5.1 g/dL — ABNORMAL LOW (ref 6.0–8.3)

## 2014-03-25 LAB — PROTIME-INR
INR: 2.12 — ABNORMAL HIGH (ref 0.00–1.49)
Prothrombin Time: 23.1 seconds — ABNORMAL HIGH (ref 11.6–15.2)

## 2014-03-25 LAB — CBC WITH DIFFERENTIAL/PLATELET
Basophils Absolute: 0 10*3/uL (ref 0.0–0.1)
Basophils Relative: 0 % (ref 0–1)
EOS ABS: 0.3 10*3/uL (ref 0.0–0.7)
Eosinophils Relative: 3 % (ref 0–5)
HEMATOCRIT: 29.1 % — AB (ref 36.0–46.0)
Hemoglobin: 8.2 g/dL — ABNORMAL LOW (ref 12.0–15.0)
Lymphocytes Relative: 7 % — ABNORMAL LOW (ref 12–46)
Lymphs Abs: 0.6 10*3/uL — ABNORMAL LOW (ref 0.7–4.0)
MCH: 21 pg — ABNORMAL LOW (ref 26.0–34.0)
MCHC: 28.2 g/dL — AB (ref 30.0–36.0)
MCV: 74.4 fL — ABNORMAL LOW (ref 78.0–100.0)
Monocytes Absolute: 1 10*3/uL (ref 0.1–1.0)
Monocytes Relative: 12 % (ref 3–12)
NEUTROS ABS: 6.7 10*3/uL (ref 1.7–7.7)
Neutrophils Relative %: 78 % — ABNORMAL HIGH (ref 43–77)
Platelets: 311 10*3/uL (ref 150–400)
RBC: 3.91 MIL/uL (ref 3.87–5.11)
RDW: 22.8 % — AB (ref 11.5–15.5)
WBC: 8.6 10*3/uL (ref 4.0–10.5)

## 2014-03-25 LAB — GLUCOSE, CAPILLARY
GLUCOSE-CAPILLARY: 142 mg/dL — AB (ref 70–99)
Glucose-Capillary: 126 mg/dL — ABNORMAL HIGH (ref 70–99)
Glucose-Capillary: 137 mg/dL — ABNORMAL HIGH (ref 70–99)
Glucose-Capillary: 137 mg/dL — ABNORMAL HIGH (ref 70–99)

## 2014-03-25 MED ORDER — WARFARIN SODIUM 3 MG PO TABS
3.0000 mg | ORAL_TABLET | Freq: Once | ORAL | Status: AC
  Administered 2014-03-25: 3 mg via ORAL
  Filled 2014-03-25: qty 1

## 2014-03-25 NOTE — Progress Notes (Signed)
Occupational Therapy Session Note  Patient Details  Name: Robin BaizeJudith M Bolio MRN: [id number] Date of Birth: 02/01/1951  Today's Date: 03/25/2014 Time: 1020-1100 Time Calculation (min): 40 min  Short Term Goals: Week 1:  OT Short Term Goal 1 (Week 1): Pt will perform LB bathing sit to stand with supervision. OT Short Term Goal 2 (Week 1): Pt will perform LB dressing sit to stand with supervision using AE. OT Short Term Goal 3 (Week 1): Pt will perform toilet transfers with supervision to elevated toilet or 3:1. OT Short Term Goal 4 (Week 1): Pt will perform 2 grooming tasks in standing with supervision. OT Short Term Goal 5 (Week 1): Pt will transfer from supine with HOB elevated with min assist in preparation for selfcare tasks.   Skilled Therapeutic Interventions/Progress Updates:  1020-1100 - 40 Minutes Individual Therapy No complaints of pain Patient received seated in w/c. Therapist assisted patient > sink and patient very eager to wash hair. Therapist assisted patient with washing hair at sink, then patient performed ADL retraining at sink. Patient performed sit<>stands with max assist, patient with increased anxiety during sit<>stand attempts. Patient with increased fatigue/decreased endurance during session. At end of session, left patient seated in w/c beside bed with all needs within reach.   Precautions:  Precautions Precautions: Fall Precaution Comments: Bilateral Una boots Restrictions Weight Bearing Restrictions: No  See FIM for current functional status  Therapy/Group: Individual Therapy  CLAY,PATRICIA 03/25/2014, 11:02 AM

## 2014-03-25 NOTE — Progress Notes (Signed)
ANTICOAGULATION CONSULT NOTE - Follow Up Consult  Pharmacy Consult for Coumadin Indication: atrial fibrillation  No Known Allergies  Patient Measurements: Height: 5\' 6"  (167.6 cm) Weight: 264 lb 8.8 oz (120 kg) IBW/kg (Calculated) : 59.3  Vital Signs: Temp: 97.6 F (36.4 C) (06/22 0500) Temp src: Oral (06/22 0500) BP: 110/67 mmHg (06/22 0500) Pulse Rate: 69 (06/22 0500)  Labs:  Recent Labs  03/23/14 0510 03/24/14 0408 03/25/14 0426  HGB  --   --  8.2*  HCT  --   --  29.1*  PLT  --   --  311  LABPROT 29.2* 26.1* 23.1*  INR 2.89* 2.49* 2.12*  CREATININE  --   --  1.80*    Estimated Creatinine Clearance: 43.3 ml/min (by C-G formula based on Cr of 1.8).  Assessment: [name] on Coumadin for Afib. INR down to 2.12 - coumadin held 6/19. Hg 8.2, plts stable; 6/16 FOB(+). Pt continues on ciprofloxacin and doxycycline which may potentiate effects of coumadin.  PTA dose = 5mg  daily  Goal of Therapy:  INR 2-3 Monitor platelets by anticoagulation protocol: Yes   Plan:  1. Coumadin 3 mg tonight 2. Daily INR  Bayard HuggerMei Bell, PharmD, BCPS  Clinical Pharmacist  Pager: [phone]   03/25/2014 9:56 AM

## 2014-03-25 NOTE — Progress Notes (Signed)
Occupational Therapy Session Note  Patient Details  Name: Robin BaizeJudith M Hackler MRN: [id number] Date of Birth: 03/27/1951  Today's Date: 03/25/2014 Time: [id number] Time Calculation (min): 30 min  Short Term Goals: Week 1:  OT Short Term Goal 1 (Week 1): Pt will perform LB bathing sit to stand with supervision. OT Short Term Goal 2 (Week 1): Pt will perform LB dressing sit to stand with supervision using AE. OT Short Term Goal 3 (Week 1): Pt will perform toilet transfers with supervision to elevated toilet or 3:1. OT Short Term Goal 4 (Week 1): Pt will perform 2 grooming tasks in standing with supervision. OT Short Term Goal 5 (Week 1): Pt will transfer from supine with HOB elevated with min assist in preparation for selfcare tasks.   Skilled Therapeutic Interventions/Progress Updates:    Pt on bedpan upon arrival and missed 15 mins skilled OT services at beginning of session.  Foucus on bed mobility to remove bed pan and to facilitate peri hygiene, sitting EOB, and stand pivot transfer to w/c.  Pt required max multimodal cues to perform supine->sit EOB and scoot out to edge of bed in preparation for transfer.  Pt required max A for sit<>stand from EOB but performed stand->pivot transfer using RW with min A.  Pt exhibits increased anxiety with transitional movements and requires max encouragement to engage in transitional movements.    Therapy Documentation Precautions:  Precautions Precautions: Fall Precaution Comments: Bilateral Una boots Restrictions Weight Bearing Restrictions: No General: General Amount of Missed OT Time (min): 15 Minutes   Pain: Pain Assessment Pain Assessment: 0-10 Pain Score: 2  Pain Type: Acute pain Pain Location: Leg Pain Orientation: Right;Left Pain Descriptors / Indicators: Aching Pain Onset: On-going Pain Intervention(s): RN aware  See FIM for current functional status  Therapy/Group: Individual Therapy  Rich BraveLanier, Thomas Chappell 03/25/2014, 12:17  PM

## 2014-03-25 NOTE — Progress Notes (Signed)
Physical Therapy Session Note  Patient Details  Name: Robin BaizeJudith M Mckain MRN: [id number] Date of Birth: 10/22/1950  Today's Date: 03/25/2014 Time: 1115-1202 Time Calculation (min): 47 min  Short Term Goals: Week 1:  PT Short Term Goal 1 (Week 1): Pt will perform bed mobility with min A.  PT Short Term Goal 2 (Week 1): Pt will perform bed <> w/c tranfers with min A.  PT Short Term Goal 3 (Week 1): Pt will ambulate 25 ft using LRAD with min A.  PT Short Term Goal 4 (Week 1): Pt will negotiate up/down 3 stairs using 2 rails with min A.  PT Short Term Goal 5 (Week 1): Pt will propel w/c 150 ft with supervision.   Skilled Therapeutic Interventions/Progress Updates:   Pt received sitting in w/c in room, agreeable to therapy session.  Assisted to therapy gym via w/c at total A level.  Skilled session focused on gait training with RW to increase distance, gait quality and overall endurance.  Performed 15' x 1, 17' x 1 and 21' x 1 with RW at min A level.  Note she initially requires +2 to stand first time, however then able to stand with max A.  +2 during gait for chair follow for safety.  Cues for upright posture and increased stride length.  Switched pts w/c to 22x18 for increased comfort and decreased pressure on hips, however still did not have pressure relief cushion in this size, so continue to use regular w/c cushion with her air cushion from acute care.  Also educated and demonstrated lateral lean pressure reliefs to perform every hour in w/c for 1 min each.  She also states that she has been able to use BUEs to do w/c push ups.  Verbalized that this is appropriate if she can hold for up to 1 min.  Remainder of session focused on BLE exercise with BLE LAQ's x 10 reps and w/c mobility using BLEs back towards room.  Requires min A for w/c mobility with cues for increased step lengths for better pull.  Also discussed that shoes may make task easier.  Discussed getting velcro shoes from [facility], as pt  concerned about drainage from LEs.  Pt verbalized understanding and to ask husband to pick her up some soon.  Pt assisted remainder of distance to room and left in w/c with all needs in reach.   Therapy Documentation Precautions:  Precautions Precautions: Fall Precaution Comments: Bilateral Una boots Restrictions Weight Bearing Restrictions: No   Pain: Pain Assessment Pain Assessment: 0-10 Pain Score: 2  Pain Type: Acute pain Pain Location: Leg Pain Orientation: Right;Left Pain Descriptors / Indicators: Aching Pain Onset: On-going Pain Intervention(s): Medication (See eMAR)   Locomotion : Ambulation Ambulation/Gait Assistance: 4: Min assist;1: +2 Total assist Wheelchair Mobility Distance: 80   See FIM for current functional status  Therapy/Group: Individual Therapy  Vista Deckarcell, Emily Ann 03/25/2014, 12:34 PM

## 2014-03-25 NOTE — Progress Notes (Signed)
[place] PHYSICAL MEDICINE & REHABILITATION     PROGRESS NOTE    Subjective/Complaints: Had a good night.Questions about Unna boot vs zip up hose  Still has some clear leakage through [employer]Unna boot  Review of Systems - Negative except as above Objective: Vital Signs: Blood pressure 110/67, pulse 69, temperature 97.6 F (36.4 C), temperature source Oral, resp. rate 18, height 5\' 6"  (1.676 m), weight 120 kg (264 lb 8.8 oz), SpO2 96.00%. No results found.  Recent Labs  03/25/14 0426  WBC 8.6  HGB 8.2*  HCT 29.1*  PLT 311    Recent Labs  03/25/14 0426  NA 133*  K 3.7  CL 96  GLUCOSE 108*  BUN 63*  CREATININE 1.80*  CALCIUM 8.2*   CBG (last 3)   Recent Labs  03/24/14 1645 03/24/14 2057 03/25/14 0732  GLUCAP 195* 174* 126*    Wt Readings from Last 3 Encounters:  03/22/14 120 kg (264 lb 8.8 oz)  03/20/14 111.5 kg (245 lb 13 oz)  10/25/11 134.265 kg (296 lb)    Physical Exam:  Constitutional: She is oriented to person, place, and time. obese  HENT: oral mucosa pink and moist  Head: Normocephalic.  Eyes: EOM are normal.  Neck: Normal range of motion. Neck supple. No thyromegaly present.  Cardiovascular:  Cardiac rate controlled. No murmur  Respiratory: Effort normal and breath sounds normal. No respiratory distress.  GI: Soft. Bowel sounds are normal. She exhibits no distension.  Musculoskeletal:  1+ bilateral LE , bilateral unna dressings in place Neurological: She is alert and oriented to person, place, and time.  Follows full commands. Alert and appropriate. No sensory deficits except for the feet which remain dimnished to light touch. UE's grossly 4 to 4+/5 deltoid, biceps, triceps, HI. LE's 3- HF, 3- KE and 3+/5 ankles. DTR's 1+  Skin:  Bilateral lower extremities with Unna boots. Scattered ecchymoses in UE's. A few abrasions areas over toes are visible beyond unna's. Stage 2 gluteal wound (left) with surrounding excoriated skin. Psychiatric: She is a  little anxious but generally appropriate and pleasant Assessment/Plan: 1. Functional deficits secondary to deconditioning after cellulitis/sepsis which require 3+ hours per day of interdisciplinary therapy in a comprehensive inpatient rehab setting. Physiatrist is providing close team supervision and 24 hour management of active medical problems listed below. Physiatrist and rehab team continue to assess barriers to discharge/monitor patient progress toward functional and medical goals. FIM: FIM - Bathing Bathing Steps Patient Completed: Chest;Right Arm;Left Arm;Abdomen;Front perineal area;Right upper leg;Left upper leg Bathing: 4: Min-Patient completes 8-9 [id number] 10 parts or 75+ percent  FIM - Upper Body Dressing/Undressing Upper body dressing/undressing: 0: Wears gown/pajamas-no public clothing FIM - Lower Body Dressing/Undressing Lower body dressing/undressing: 1: Total-Patient completed less than 25% of tasks  FIM - Toileting Toileting: 1: Total-Patient completed zero steps, helper did all 3  FIM - Diplomatic Services operational officerToilet Transfers Toilet Transfers Assistive Devices: Building control surveyorBedside commode;Walker Toilet Transfers: 3-To toilet/BSC: Mod A (lift or lower assist);3-From toilet/BSC: Mod A (lift or lower assist)  FIM - Bed/Chair Transfer Bed/Chair Transfer Assistive Devices: Walker Bed/Chair Transfer: 1: Supine > Sit: Total A (helper does all/Pt. < 25%);2: Bed > Chair or W/C: Max A (lift and lower assist);2: Sit > Supine: Max A (lifting assist/Pt. 25-49%);2: Chair or W/C > Bed: Max A (lift and lower assist)  FIM - Locomotion: Wheelchair Distance: 30 Locomotion: Wheelchair: 1: Travels less than 50 ft with minimal assistance (Pt.>75%) FIM - Locomotion: Ambulation Locomotion: Ambulation Assistive Devices: Designer, industrial/productWalker - Rolling Ambulation/Gait Assistance:  3: Mod assist Locomotion: Ambulation: 1: Two helpers (w/c follow for safety)  Comprehension Comprehension Mode: Auditory Comprehension: 7-Follows complex  conversation/direction: With no assist  Expression Expression Mode: Verbal Expression: 7-Expresses complex ideas: With no assist  Social Interaction Social Interaction: 7-Interacts appropriately with others - No medications needed.  Problem Solving Problem Solving: 7-Solves complex problems: Recognizes & self-corrects  Memory Memory: 7-Complete Independence: No helper Medical Problem List and Plan:  1. Functional deficits secondary to Deconditioning after multi-medical iisssues  2. DVT Prophylaxis/Anticoagulation: Chronic Coumadin therapy for atrial fibrillation. Monitor for any bleeding episodes  3. Pain Management: Hydrocodone as needed. Monitor with increased mobility  4. Mood/depression: Wellbutrin 300 mg daily, xanax to 0.5mg  tid prn.  5. Neuropsych: This patient is capable of making decisions on his own behalf.  6. Atrial fibrillation. Continue Coumadin therapy as directed. Cardiac rate control. No chest pain or shortness of breath at present 7. Hypertension. Lasix 40 mg daily, Lopressor 12.5 mg twice a day. Monitor with increased mobility  8. AKI/hyponatremia/hyperkalemia. Followup per renal services. Labs are normalizing.   Creatinine baseline 1.32 --labs improving  -recheck labs in am 9. Acute on chronic anemia. Latest hemoglobin 8.6.  10. Chronic leg ulcers. Empiric doxycycline/Cipro. [facility]  -may be able to dc unna's soon- will neede comp hose vs ACE wraps  11. Diabetes mellitus with peripheral neuropathy. Hemoglobin A1c 7.2. Lantus insulin 30 units each bedtime. Check CBGs a.c. and at bedtime.   -sugars acceptable 12. Hyperlipidemia. Zocor  13. MRSA PCR screening positive. Contact precautions      LOS (Days) 3 A FACE TO FACE EVALUATION WAS PERFORMED  Erick ColaceKIRSTEINS,ANDREW E 03/25/2014 8:57 AM

## 2014-03-25 NOTE — Progress Notes (Signed)
Patient information reviewed and entered into eRehab system by Marie Noel, RN, CRRN, PPS Coordinator.  Information including medical coding and functional independence measure will be reviewed and updated through discharge.    

## 2014-03-25 NOTE — Progress Notes (Signed)
Pt.'s UOP 250 cc's this shift.  Bladder scan showed only 45 cc's in bladder.

## 2014-03-25 NOTE — Progress Notes (Signed)
Pt refused care to panus and under breast areas last HS.

## 2014-03-25 NOTE — Progress Notes (Signed)
Social Work Assessment and Plan Social Work Assessment and Plan  Patient Details  Name: Robin [name] MRN: [id number] Date of Birth: 03/01/1951  Today's Date: 03/25/2014  Problem List:  Patient Active Problem List   Diagnosis Date Noted  . Physical deconditioning 03/22/2014  . Ulcers of both lower legs, chronic venous stasis 03/21/2014  . Hyperkalemia 03/16/2014  . Hyponatremia 03/16/2014  . DKA (diabetic ketoacidoses) 03/16/2014  . Sepsis 03/16/2014  . Atrial fibrillation 08/07/2013  . Varicose veins of lower extremities with ulcer 10/25/2011  . Varicose veins of lower extremities with other complications 10/18/2011  . Venous insufficiency 07/12/2011   Past Medical History:  Past Medical History  Diagnosis Date  . Diabetes mellitus   . Hypertension   . Hyperlipidemia   . Atrial fibrillation   . Sleep apnea   . Cholelithiasis   . CAD (coronary artery disease)     atrial fibrillation   Past Surgical History:  Past Surgical History  Procedure Laterality Date  . Tonsillectomy  1970s  . Hemorroidectomy  1990s  . Choledochal cyst excision  1980s  . Parathyroidectomy    . Endovenous ablation saphenous vein w/ laser  08-02-2011  Right greater saphenous vein   Social History:  reports that she has never smoked. She has never used smokeless tobacco. She reports that she does not drink alcohol or use illicit drugs.  Family / Support Systems Marital Status: Married Patient Roles: Spouse;Parent;Caregiver Spouse/Significant Other: Molly MaduroRobert [phone]  [phone]  [phone]  [phone] Children: Greer Pickerelobert Jr Other Supports: Extended family and friends Anticipated Caregiver: Self and husband Ability/Limitations of Caregiver: Husband works during the day, pt needs to be mod/i while he is gone working Medical laboratory scientific officerCaregiver Availability: Evenings only Family Dynamics: Close knit family who rely upon one another, she and husband have been provding care to their son since he was born, he is now 62  years old.  They have a good routine.  Pt wants to get back to her indpendent level so she can take him to his program and help her husband.  Social History Preferred language: English Religion: Lutheran Cultural Background: No issues Education: Automotive engineerCollege Educated Read: Yes Write: Yes Employment Status: Disabled Date Retired/Disabled/Unemployed: 3 years ago Fish farm managerLegal Hisotry/Current Legal Issues: No issues Guardian/Conservator: None-according to MD pt is capable of making any decisions while here.   Abuse/Neglect Physical Abuse: Denies Verbal Abuse: Denies Sexual Abuse: Denies Exploitation of patient/patient's resources: Denies Self-Neglect: Denies  Emotional Status Pt's affect, behavior adn adjustment status: Pt is motivated to improve and make good progress while here.  She finally feels better and feels she can participate in therapies now.  She is glad to be here on the rehab unit, she feels she will do well here.  She relies upon herself and is confident she will do well here. Recent Psychosocial Issues: Other medical issues and the care of her son Pyschiatric History: History of depression/ anxiety.  She takes medicines for this and feels they help her.  She is also extremely clastrophobic and wants her door and curtain open at all times.  Deferred depression at this time due to dealing with everytihng well at this point.  Will intervene if concerns arise. Substance Abuse History: No issues  Patient / Family Perceptions, Expectations & Goals Pt/Family understanding of illness & functional limitations: Pt and husband can explain her condition and medical issues.  Both are pleased she is on rehab and did not have to go to a NH.  Husband will do what he can to  assist her, while providing care to son also.  Pt wants to be able to assist and do for herself, so to not burden her husband. Premorbid pt/family roles/activities: Wife, Mother, Caregiver, Chruch member, etc Anticipated changes in  roles/activities/participation: Plans to resume Pt/family expectations/goals: Pt states: " I want to take care of my own needs and not need my husband 's assistance."  Husband states: " I hope she can get back to her previous functioning."  [employer] IncCommunity Resources Community Agencies: Other (Comment) ([facility]) Premorbid Home Care/DME Agencies: Other (Comment) (Had in past) Transportation available at discharge: Husband can provide until she can resume Resource referrals recommended: Support group (specify) (Caregiver SUpport group)  Discharge Planning Living Arrangements: Spouse/significant other;Children Support Systems: Spouse/significant other;Children;Other relatives;Friends/neighbors;Church/faith community Type of Residence: Private residence Insurance Resources: Media plannerrivate Insurance (specify) Chief Executive Officer(State BCBS) Financial Resources: Family Support Financial Screen Referred: No Living Expenses: Lives with family Money Management: Patient;Spouse Does the patient have any problems obtaining your medications?: No Home Management: Husband, pt tries to assist with also Patient/Family Preliminary Plans: Return home with husband and son, husband can assist in the evening, but works during the day.  Son is at after [employer] program M-F during the day.  Pt is alone while both of them are gone.  Pt needs to be mod/i level to be alone during the day and this is her goal. Social Work Anticipated Follow Up Needs: HH/OP;Support Group  Clinical Impression Pleasant female who is motivated to improve and regain her independence before she leaves rehab.  Her husband is very supportive and will assist in the evenings when he is home. Both care for their 62 yo special needs son.  Will provide support and work on a safe discharge plan.  Lucy Chrisupree, Rebecca G 03/25/2014, 1:29 PM

## 2014-03-25 NOTE — Progress Notes (Signed)
Physical Therapy Session Note  Patient Details  Name: Samora M Cahalan Jewel BaizeMRN: [id number] Date of Birth: 08/03/1951  Today's Date: 03/25/2014 Time: 1402-1500 Time Calculation (min): 58 min  Short Term Goals: Week 1:  PT Short Term Goal 1 (Week 1): Pt will perform bed mobility with min A.  PT Short Term Goal 2 (Week 1): Pt will perform bed <> w/c tranfers with min A.  PT Short Term Goal 3 (Week 1): Pt will ambulate 25 ft using LRAD with min A.  PT Short Term Goal 4 (Week 1): Pt will negotiate up/down 3 stairs using 2 rails with min A.  PT Short Term Goal 5 (Week 1): Pt will propel w/c 150 ft with supervision.   Skilled Therapeutic Interventions/Progress Updates:   Pt received lying in bed, agreeable to therapy session.  Performed bed mobility with HOB flat (mostly flat) but with rails initially in room to determine amount of assist pt needs and to provide cues for easier, more correct technique.  Pt able to lower BLEs out of bed and get into SL, however requires mod/max A and mod cues for elevating trunk and correct hand placement to self assist.  Once at EOB, pt able to scoot to EOB with S.  Assisted with threading shorts onto LEs and then stood with mod A from elevated bed surface.  Assisted with adjusting clothing and then transferred to w/c at min A level.  Assisted pt to/from ADL apt in order to work on functional transfers with RW, bed mobility in real bed and bed level exercises (for increased carryover in bed in room).  Performed transfer with mod A with RW.  Able to get into bed with assist for BLEs.  Also requires assist for scooting hips over in bed.  Performed supine knee presses x 10 reps, SLR x 10 reps BLE, BLE hip abd x 10 reps and educated on performing ankle pumps.  Returned to sitting position via SL with mod/max A.  Pt anxious about being close to EOB, however had her scoot prior to transition for increased safety.  Pt stood from bed with mod A (note marked improvement when using  increased forward momentum).  Assisted back to chair (to/from w/c x 10' w/ RW) and assisted back to room.  Left in w/c in room with all needs in reach.    Therapy Documentation Precautions:  Precautions Precautions: Fall Precaution Comments: Bilateral Una boots Restrictions Weight Bearing Restrictions: No   Vital Signs: Therapy Vitals Temp: 97.4 F (36.3 C) Temp src: Oral Pulse Rate: 75 Resp: 17 BP: 100/68 mmHg Patient Position (if appropriate): Lying Oxygen Therapy SpO2: 100 % O2 Device: None (Room air) Pain: Mild c/o pain in low back when performing bed mobility   Locomotion : Ambulation Ambulation/Gait Assistance: 4: Min assist Wheelchair Mobility Distance: 80   See FIM for current functional status  Therapy/Group: Individual Therapy  Vista Deckarcell, Emily Ann 03/25/2014, 3:25 PM

## 2014-03-25 NOTE — Care Management Note (Signed)
[facility] Individual Statement of Services  Patient Name:  Robin [name]  Date:  03/25/2014  Welcome to the [facility]patient [facility].  Our goal is to provide you with an individualized program based on your diagnosis and situation, designed to meet your specific needs.  With this comprehensive rehabilitation program, you will be expected to participate in at least 3 hours of rehabilitation therapies Monday-Friday, with modified therapy programming on the weekends.  Your rehabilitation program will include the following services:  Physical Therapy (PT), Occupational Therapy (OT), 24 hour per day rehabilitation nursing, Neuropsychology, Case Management (Social Worker), Rehabilitation Medicine, Nutrition Services and Pharmacy Services  Weekly team conferences will be held on Wednesday to discuss your progress.  Your Social Worker will talk with you frequently to get your input and to update you on team discussions.  Team conferences with you and your family in attendance may also be held.  Expected length of stay: 12-14 days  Overall anticipated outcome: mod/i-supervision level  Depending on your progress and recovery, your program may change. Your Social Worker will coordinate services and will keep you informed of any changes. Your Social Worker's name and contact numbers are listed  below.  The following services may also be recommended but are not provided by the Inpatient [facility]:   Driving Evaluations  Home Health Rehabiltiation Services  Outpatient Rehabilitation Services   Arrangements will be made to provide these services after discharge if needed.  Arrangements include referral to agencies that provide these services.  Your insurance has been verified to be:  [employer] BCBS Your primary doctor is:  Dr Laurann Montanaynthia White  Pertinent information will be shared with your doctor and your insurance company.  Social Worker:  Dossie DerBecky Daley Gosse, SW  [phone] or [phone]  Information discussed with and copy given to patient by: Lucy Chrisupree, Salathiel Ferrara G, 03/25/2014, 1:03 PM

## 2014-03-25 NOTE — IPOC Note (Signed)
Overall Plan of Care [facility](IPOC) Patient Details Name: Robin [name] MRN: [id number] DOB: 07/10/1951  Admitting Diagnosis: Deconditioned BLE cellutitis  MRSA  Hospital Problems: Active Problems:   Physical deconditioning     Functional Problem List: Nursing Bladder;Bowel;Endurance;Medication Management;Motor;Safety;Perception;Sensory;Skin Integrity;Pain  PT Balance;Endurance;Motor;Pain;Merchant navy officerafety;Skin Integrity  OT Endurance;Motor;Pain;Safety  SLP    TR         Basic ADL's: OT Grooming;Bathing;Dressing;Toileting     Advanced  ADL's: OT Simple Meal Preparation     Transfers: PT Bed Mobility;Bed to Chair;Car;Furniture  OT Toilet;Tub/Shower     Locomotion: PT Ambulation;Wheelchair Mobility;Stairs     Additional Impairments: OT    SLP        TR      Anticipated Outcomes Item Anticipated Outcome  Self Feeding independent  Swallowing      Basic self-care  modified independent  Toileting  modified independent   Bathroom Transfers modified independent  Bowel/Bladder  regular stools q 1-2 days,voids w/o difficulty once foley discontinued  Transfers  mod I  Locomotion  mod I short distances in home/controlled environment, S short distances in community environment  Communication     Cognition     Pain  pain relief,able to participate in therapy and own care.  Safety/Judgment  follows safety plan,safe mobility,d/c to safe enviornment   Therapy Plan: PT Intensity: Minimum of 1-2 x/day ,45 to 90 minutes PT Frequency: 5 out of 7 days PT Duration Estimated Length of Stay: 12-14 days OT Intensity: Minimum of 1-2 x/day, 45 to 90 minutes OT Frequency: 5 out of 7 days OT Duration/Estimated Length of Stay: 12 days         Team Interventions: Nursing Interventions Patient/Family Education;Bladder Management;Bowel Management;Pain Management;Medication Management;Skin Care/Wound Management;Discharge Planning  PT interventions Ambulation/gait training;Discharge  planning;DME/adaptive equipment instruction;Functional mobility training;Neuromuscular re-education;Pain management;Patient/family education;Skin care/wound management;Stair training;Therapeutic Activities;Therapeutic Exercise;UE/LE Strength taining/ROM;UE/LE Coordination activities;Wheelchair propulsion/positioning;Balance/vestibular training  OT Interventions Balance/vestibular training;DME/adaptive equipment instruction;Community reintegration;Discharge planning;Functional mobility training;Psychosocial support;Patient/family education;Therapeutic Activities;UE/LE Strength taining/ROM;UE/LE Coordination activities;Pain management;Neuromuscular re-education;Self Care/advanced ADL retraining;Therapeutic Exercise  SLP Interventions    TR Interventions    SW/CM Interventions      Team Discharge Planning: Destination: PT-Home ,OT- Home , SLP-  Projected Follow-up: PT-Home health PT, OT-  Home health OT, SLP-  Projected Equipment Needs: PT-To be determined, OT- Tub/shower bench, SLP-  Equipment Details: PT-anticipate pt will need RW and manual w/c at d/c, OT-will need tub bench that fastens to the edge of the tub Patient/family involved in discharge planning: PT- Patient,  OT-Patient, SLP-   MD ELOS: 14-18d Medical Rehab Prognosis:  Good Assessment: 63 y.o. right-handed female with history of hypertension, atrial fibrillation with Coumadin therapy, peripheral vascular disease with chronic leg wounds. Patient independent prior to admission living with her husband and using a straight point cane. Presented 03/16/2014 with generalized weakness and worsening of leg ulcers. Noted hyperkalemia 7.2 as well as hyponatremic at 111, BUN 139, creatinine 2.28. Patient did receive Kayexalate for hyperkalemia. IV fluid normal saline initiated for hyponatremia. Renal service consulted in relation to acute renal failure. Renal ultrasound with no hydronephrosis. It was felt that severe hyponatremia possibly related to  excess water consumption on top of AKI  Now requiring 24/7 Rehab RN,MD, as well as CIR level PT, OT and SLP.  Treatment team will focus on ADLs and mobility with goals set at Mod I   See Team Conference Notes for weekly updates to the plan of care

## 2014-03-26 ENCOUNTER — Encounter (HOSPITAL_COMMUNITY): Payer: BC Managed Care – PPO | Admitting: Occupational Therapy

## 2014-03-26 ENCOUNTER — Inpatient Hospital Stay (HOSPITAL_COMMUNITY): Payer: BC Managed Care – PPO | Admitting: Rehabilitation

## 2014-03-26 ENCOUNTER — Inpatient Hospital Stay (HOSPITAL_COMMUNITY): Payer: BC Managed Care – PPO | Admitting: Occupational Therapy

## 2014-03-26 DIAGNOSIS — E669 Obesity, unspecified: Secondary | ICD-10-CM

## 2014-03-26 DIAGNOSIS — I4891 Unspecified atrial fibrillation: Secondary | ICD-10-CM

## 2014-03-26 DIAGNOSIS — L97909 Non-pressure chronic ulcer of unspecified part of unspecified lower leg with unspecified severity: Secondary | ICD-10-CM

## 2014-03-26 DIAGNOSIS — R5381 Other malaise: Secondary | ICD-10-CM

## 2014-03-26 DIAGNOSIS — A419 Sepsis, unspecified organism: Secondary | ICD-10-CM

## 2014-03-26 DIAGNOSIS — E1110 Type 2 diabetes mellitus with ketoacidosis without coma: Secondary | ICD-10-CM

## 2014-03-26 LAB — GLUCOSE, CAPILLARY
GLUCOSE-CAPILLARY: 118 mg/dL — AB (ref 70–99)
Glucose-Capillary: 132 mg/dL — ABNORMAL HIGH (ref 70–99)
Glucose-Capillary: 118 mg/dL — ABNORMAL HIGH (ref 70–99)

## 2014-03-26 LAB — PROTIME-INR
INR: 2.08 — AB (ref 0.00–1.49)
Prothrombin Time: 22.7 seconds — ABNORMAL HIGH (ref 11.6–15.2)

## 2014-03-26 MED ORDER — GLUCERNA SHAKE PO LIQD: 237.0000 mL | Freq: Every day | ORAL | Status: DC | PRN

## 2014-03-26 MED ORDER — WARFARIN SODIUM 4 MG PO TABS
4.0000 mg | ORAL_TABLET | Freq: Once | ORAL | Status: AC
  Administered 2014-03-26: 4 mg via ORAL
  Filled 2014-03-26: qty 1

## 2014-03-26 NOTE — Plan of Care (Signed)
Problem: RH BLADDER ELIMINATION Goal: RH STG MANAGE BLADDER WITH MEDICATION WITH ASSISTANCE STG Manage Bladder With Medication With Assistance.  Outcome: Not Progressing Has foley cathter

## 2014-03-26 NOTE — Progress Notes (Signed)
Occupational Therapy Session Note  Patient Details  Name: Robin [name] MRN: [id number] Date of Birth: 06/04/1951  Today's Date: 03/26/2014 Time: 0800-0830 Time Calculation (min): 30 min  Short Term Goals: Week 1:  OT Short Term Goal 1 (Week 1): Pt will perform LB bathing sit to stand with supervision. OT Short Term Goal 2 (Week 1): Pt will perform LB dressing sit to stand with supervision using AE. OT Short Term Goal 3 (Week 1): Pt will perform toilet transfers with supervision to elevated toilet or 3:1. OT Short Term Goal 4 (Week 1): Pt will perform 2 grooming tasks in standing with supervision. OT Short Term Goal 5 (Week 1): Pt will transfer from supine with HOB elevated with min assist in preparation for selfcare tasks.   Skilled Therapeutic Interventions/Progress Updates:    1:1 Focus on bed mobility supine to sit with bed rail, sit to stand and stand pivot with RW to w/c. Focused on transitional movements with bed mobility with manual facilitation at the hip for rotation to roll onto right side for sidelying and max A to come into sitting. Pt required max to total A and more than reasonable amt of time to perform reciprocal scooting at EOB for feet to touch the floor. (some difficulty was due to type of bed and bed having a elevated side/ edge resulting in bed sitting in the "hole" of the bed). Pt required min to mod A for sitting balance at EOB when feet not supported. Max a to come into standing from EOB. Min A for stand pivot with extra time to pivot into chair.  2nd session: 11:00-12:00 soreness in bilateral calves - RN aware  1:1 self care retraining at sink level with focus on sit to stand, standing balance with and without UE support for peri hygiene and clothing management, and activity tolerance. Pt required max A to perform sit to stand and min A to maintain standing balance. Pt able to wash front periarea but required A to finish wash bottom. Issued elevating leg rests  3rd  session 13:00-13:45 Soreness in bilateral calves and c/o increased fatigue 1:1 Focus on reciprocal scooting, Sit to stand and standing balance. Sit to stand with mod to max A from w/c at elevated table. Focused on standing tolerance- able to stand for 1 min at a time. Performed marching and side stepping with min A with rest breaks in between. Got new Size wise bed which allowed pt to get into bed easily with more even edge of bed - requiring assistance for bilateral LEs into bed.      Therapy Documentation Precautions:  Precautions Precautions: Fall Precaution Comments: Bilateral Una boots Restrictions Weight Bearing Restrictions: Yes Pain: Pain Assessment Pain Score: 0-No pain Pain Type: Acute pain Pain Location: Shoulder Pain Orientation: Left Pain Descriptors / Indicators: Aching Pain Intervention(s): Medication (See eMAR), RN aware   See FIM for current functional status  Therapy/Group: Individual Therapy  Roney MansSmith, Jennifer [facility] 03/26/2014, 9:41 AM

## 2014-03-26 NOTE — Progress Notes (Addendum)
[facility] PHYSICAL MEDICINE & REHABILITATION     PROGRESS NOTE    Subjective/Complaints: Questions re duration of abx, loose stools x 2 noted, no abd pain Per hospitalist D/C summary, 2 wk duration   Review of Systems - Negative except as above Objective: Vital Signs: Blood pressure 104/66, pulse 73, temperature 98.2 F (36.8 C), temperature source Oral, resp. rate 18, height 5\' 6"  (1.676 m), weight 123.378 kg (272 lb), SpO2 96.00%. No results found.  Recent Labs  03/25/14 0426  WBC 8.6  HGB 8.2*  HCT 29.1*  PLT 311    Recent Labs  03/25/14 0426  NA 133*  K 3.7  CL 96  GLUCOSE 108*  BUN 63*  CREATININE 1.80*  CALCIUM 8.2*   CBG (last 3)   Recent Labs  03/25/14 1609 03/25/14 2056 03/26/14 0727  GLUCAP 142* 137* 118*    Wt Readings from Last 3 Encounters:  03/26/14 123.378 kg (272 lb)  03/20/14 111.5 kg (245 lb 13 oz)  10/25/11 134.265 kg (296 lb)    Physical Exam:  Constitutional: She is oriented to person, place, and time. obese  HENT: oral mucosa pink and moist  Head: Normocephalic.  Eyes: EOM are normal.  Neck: Normal range of motion. Neck supple. No thyromegaly present.  Cardiovascular:  Cardiac rate controlled. No murmur  Respiratory: Effort normal and breath sounds normal. No respiratory distress.  GI: Soft. Bowel sounds are normal. She exhibits no distension.  Musculoskeletal:  1+ bilateral LE , bilateral unna dressings in place Neurological: She is alert and oriented to person, place, and time.  Follows full commands. Alert and appropriate. No sensory deficits except for the feet which remain dimnished to light touch.Skin:  Bilateral lower extremities with Unna boots. Scattered ecchymoses in UE's. A few abrasions areas over toes are visible beyond unna's. Psychiatric: She is a little anxious but generally appropriate and pleasant Assessment/Plan: 1. Functional deficits secondary to deconditioning after cellulitis/sepsis which require 3+  hours per day of interdisciplinary therapy in a comprehensive inpatient rehab setting. Physiatrist is providing close team supervision and 24 hour management of active medical problems listed below. Physiatrist and rehab team continue to assess barriers to discharge/monitor patient progress toward functional and medical goals. FIM: FIM - Bathing Bathing Steps Patient Completed: Chest;Right Arm;Left Arm;Abdomen;Front perineal area;Right upper leg;Left upper leg Bathing: 4: Min-Patient completes 8-9 4550f 10 parts or 75+ percent  FIM - Upper Body Dressing/Undressing Upper body dressing/undressing: 0: Wears gown/pajamas-no public clothing FIM - Lower Body Dressing/Undressing Lower body dressing/undressing: 1: Total-Patient completed less than 25% of tasks  FIM - Toileting Toileting: 3: Mod-Patient completed 2 of 3 steps  FIM - Diplomatic Services operational officerToilet Transfers Toilet Transfers Assistive Devices: Building control surveyorBedside commode;Walker Toilet Transfers: 3-To toilet/BSC: Mod A (lift or lower assist);3-From toilet/BSC: Mod A (lift or lower assist)  FIM - BankerBed/Chair Transfer Bed/Chair Transfer Assistive Devices: Walker;Bed rails Bed/Chair Transfer: 2: Supine > Sit: Max A (lifting assist/Pt. 25-49%);3: Sit > Supine: Mod A (lifting assist/Pt. 50-74%/lift 2 legs);3: Bed > Chair or W/C: Mod A (lift or lower assist);3: Chair or W/C > Bed: Mod A (lift or lower assist)  FIM - Locomotion: Wheelchair Distance: 80 Locomotion: Wheelchair: 0: Activity did not occur FIM - Locomotion: Ambulation Locomotion: Ambulation Assistive Devices: Designer, industrial/productWalker - Rolling Ambulation/Gait Assistance: 4: Min assist Locomotion: Ambulation: 1: Travels less than 50 ft with minimal assistance (Pt.>75%)  Comprehension Comprehension Mode: Auditory Comprehension: 7-Follows complex conversation/direction: With no assist  Expression Expression Mode: Verbal Expression: 7-Expresses complex ideas: With no assist  Social Interaction Social Interaction: 6-Interacts  appropriately with others with medication or extra time (anti-anxiety, antidepressant).  Problem Solving Problem Solving: 6-Solves complex problems: With extra time  Memory Memory: 6-More than reasonable amt of time Medical Problem List and Plan:  1. Functional deficits secondary to Deconditioning after multi-medical iisssues  2. DVT Prophylaxis/Anticoagulation: Chronic Coumadin therapy for atrial fibrillation. Monitor for any bleeding episodes  3. Pain Management: Hydrocodone as needed. Monitor with increased mobility  4. Mood/depression: Wellbutrin 300 mg daily, xanax to 0.5mg  tid prn.  5. Neuropsych: This patient is capable of making decisions on his own behalf.  6. Atrial fibrillation. Continue Coumadin therapy as directed. Cardiac rate control. No chest pain or shortness of breath at present 7. Hypertension. Lasix 40 mg daily, Lopressor 12.5 mg twice a day. Monitor with increased mobility  8. AKI/hyponatremia/hyperkalemia. Followup per renal services. Labs are normalizing.   Creatinine baseline 1.32 --labs improving  -recheck labs in am 9. Acute on chronic anemia. Latest hemoglobin 8.6.  10. Chronic leg ulcers. Empiric doxycycline/Cipro. [facility]  -may be able to dc unna's soon- will need comp hose vs ACE wraps  11. Diabetes mellitus with peripheral neuropathy. Hemoglobin A1c 7.2. Lantus insulin 30 units each bedtime. Check CBGs a.c. and at bedtime.   -sugars acceptable 12. Hyperlipidemia. Zocor  13. MRSA PCR screening positive. Contact precautions      LOS (Days) 4 A FACE TO FACE EVALUATION WAS PERFORMED  KIRSTEINS,ANDREW E 03/26/2014 8:02 AM

## 2014-03-26 NOTE — Progress Notes (Signed)
ANTICOAGULATION CONSULT NOTE - Follow Up Consult  Pharmacy Consult for Coumadin Indication: atrial fibrillation  No Known Allergies  Patient Measurements: Height: 5\' 6"  (167.6 cm) Weight: 272 lb (123.378 kg) IBW/kg (Calculated) : 59.3  Vital Signs: Temp: 98.2 F (36.8 C) (06/23 0500) Temp src: Oral (06/23 0500) BP: 104/66 mmHg (06/23 0500) Pulse Rate: 73 (06/23 0500)  Labs:  Recent Labs  03/24/14 0408 03/25/14 0426 03/26/14 0435  HGB  --  8.2*  --   HCT  --  29.1*  --   PLT  --  311  --   LABPROT 26.1* 23.1* 22.7*  INR 2.49* 2.12* 2.08*  CREATININE  --  1.80*  --     Estimated Creatinine Clearance: 44 ml/min (by C-G formula based on Cr of 1.8).  Assessment: [name] on Coumadin for Afib. INR down to 2.08, trending down - coumadin held 6/19. Hg 8.2, plts stable; 6/16 FOB(+). Pt continues on ciprofloxacin and doxycycline which may potentiate effects of coumadin.  PTA dose = 5mg  daily  Goal of Therapy:  INR 2-3 Monitor platelets by anticoagulation protocol: Yes   Plan:  1. Coumadin 4 mg tonight 2. Daily INR  Bayard HuggerMei Bell, PharmD, BCPS  Clinical Pharmacist  Pager: [phone]   03/26/2014 9:20 AM

## 2014-03-26 NOTE — Progress Notes (Addendum)
INITIAL NUTRITION ASSESSMENT  DOCUMENTATION CODES Per approved criteria  -Morbid Obesity   INTERVENTION: Glucerna Shake po daily PRN, each supplement provides 220 kcal and 10 grams of protein RD to follow for nutrition care plan  NUTRITION DIAGNOSIS: Increased nutrient needs related to wound healing as evidenced by estimated nutrition needs  Goal: Pt to meet >/= 90% of their estimated nutrition needs   Monitor:  PO & supplemental intake, weight, labs, I/O's  Reason for Assessment: Low Braden  63 y.o. female  Admitting Dx: deconditioning after cellulitis/sepsis  ASSESSMENT: 63 y.o. Female with history of HTN, atrial fibrillation with Coumadin therapy, peripheral vascular disease with chronic leg wounds. Patient independent PTA living with her husband and using a straight point cane. Presented 03/16/2014 with generalized weakness and worsening of leg ulcers. Placed on vancomycin and Zosyn for chronic leg ulcers and transition to oral antibiotics. Patient was admitted for comprehensive rehabilitation program.  Patient reports her appetite has greatly improved since she was first admitted to the hospital; PO intake mostly 100% per flowsheet records; does have a Stage II gluteal wound; kcal & protein needs are increased due to this -- will order oral nutrition supplement on as needed basis.  Low braden score places patient at risk for further skin breakdown.  Height: Ht Readings from Last 1 Encounters:  03/22/14 5\' 6"  (1.676 m)    Weight: Wt Readings from Last 1 Encounters:  03/26/14 272 lb (123.378 kg)    Ideal Body Weight: 130 lb  % Ideal Body Weight: 209%  Wt Readings from Last 10 Encounters:  03/26/14 272 lb (123.378 kg)  03/20/14 245 lb 13 oz (111.5 kg)  10/25/11 296 lb (134.265 kg)  10/18/11 296 lb (134.265 kg)  08/30/11 298 lb (135.172 kg)  08/10/11 305 lb (138.347 kg)  08/02/11 296 lb (134.265 kg)  07/12/11 298 lb 9.6 oz (135.444 kg)    Usual Body Weight:  245 lb  % Usual Body Weight: 111%  BMI:  Body mass index is 43.92 kg/(m^2).  Estimated Nutritional Needs: Kcal: 1700-1900 Protein: 90-100 gm Fluid: 1.7-1.9 L  Skin: Stage 2 gluteal wound (left) with surrounding excoriated skin  Diet Order: Carb Control  EDUCATION NEEDS: -No education needs identified at this time   Intake/Output Summary (Last 24 hours) at 03/26/14 1201 Last data filed at 03/26/14 0900  Gross per 24 hour  Intake    760 ml  Output    425 ml  Net    335 ml    Labs:   Recent Labs Lab 03/21/14 0517 03/22/14 0755 03/25/14 0426  NA 130* 130* 133*  K 3.3* 4.0 3.7  CL 93* 93* 96  CO2 21 22 23   BUN 70* 65* 63*  CREATININE 1.50* 1.55* 1.80*  CALCIUM 8.4 8.4 8.2*  GLUCOSE 109* 120* 108*    CBG (last 3)   Recent Labs  03/25/14 2056 03/26/14 0727 03/26/14 1144  GLUCAP 137* 118* 132*    Scheduled Meds: . buPROPion  300 mg Oral Daily  . ciprofloxacin  500 mg Oral BID  . doxycycline  100 mg Oral Q12H  . furosemide  40 mg Oral Daily  . insulin aspart  0-9 Units Subcutaneous TID WC  . insulin glargine  30 Units Subcutaneous QHS  . metoprolol tartrate  12.5 mg Oral BID  . simvastatin  20 mg Oral q1800  . sodium bicarbonate  650 mg Oral TID  . warfarin  4 mg Oral ONCE-1800  . Warfarin - Pharmacist Dosing Inpatient  Does not apply q1800    Continuous Infusions:   Past Medical History  Diagnosis Date  . Diabetes mellitus   . Hypertension   . Hyperlipidemia   . Atrial fibrillation   . Sleep apnea   . Cholelithiasis   . CAD (coronary artery disease)     atrial fibrillation    Past Surgical History  Procedure Laterality Date  . Tonsillectomy  1970s  . Hemorroidectomy  1990s  . Choledochal cyst excision  1980s  . Parathyroidectomy    . Endovenous ablation saphenous vein w/ laser  08-02-2011  Right greater saphenous vein   Maureen ChattersKatie Lamberton, RD, LDN Pager #: [phone] After-Hours Pager #: [phone]

## 2014-03-26 NOTE — Progress Notes (Signed)
Physical Therapy Session Note  Patient Details  Name: Robin [name] MRN: [id number] Date of Birth: 03/07/1951  Today's Date: 03/26/2014 Time: [id number] Time Calculation (min): 58 min  Short Term Goals: Week 1:  PT Short Term Goal 1 (Week 1): Pt will perform bed mobility with min A.  PT Short Term Goal 2 (Week 1): Pt will perform bed <> w/c tranfers with min A.  PT Short Term Goal 3 (Week 1): Pt will ambulate 25 ft using LRAD with min A.  PT Short Term Goal 4 (Week 1): Pt will negotiate up/down 3 stairs using 2 rails with min A.  PT Short Term Goal 5 (Week 1): Pt will propel w/c 150 ft with supervision.   Skilled Therapeutic Interventions/Progress Updates:   Pt received sitting in w/c in room, agreeable to therapy session.  Pt states she needs to have bowel movement prior to session, therefore pt agreeable to ambulate to restroom using RW.  Requires +2 to stand due to low surface from w/c (pt assist approx 25%).  Ambulated to restroom with min/mod A with mod cues for larger steps and also for safe negotiation of RW in restroom.  Pt successful in voiding bowel in toilet.  Assisted with threading pts underwear and shorts per pt request for increased comfort.  Also assisted with peri care.  Pt able to stand for approx 1 min before needing to sit prior to ambulating out of restroom.  Pt ambulated out of restroom to w/c with min/mod A, again with cues as mentioned above.  Note PT added extra pillow under w/c cushion with dicem in order to build up seat for easier standing and also to increase amount of cushion.  Assisted to/from therapy gym at total A level via w/c in order to work on stair negotiation and sit<>stand transfers.  Performed 2, 4" steps with B handrail at mod A level.  Cues for upright posture, increase glute and quad activation to boost onto step.  Ended with two reps of standing at mod/max A x 2 mins each.  Cues for continued upright posture and increased glute/quad activation during  standing.  Pt assisted back to room and left in w/c with all needs in reach.   Therapy Documentation Precautions:  Precautions Precautions: Fall Precaution Comments: Bilateral Una boots Restrictions Weight Bearing Restrictions: Yes   Pain: Pain Assessment Pain Score: 0-No pain  See FIM for current functional status  Therapy/Group: Individual Therapy  Vista Deckarcell, Marion Rosenberry Ann 03/26/2014, 12:09 PM

## 2014-03-27 ENCOUNTER — Inpatient Hospital Stay (HOSPITAL_COMMUNITY): Payer: BC Managed Care – PPO | Admitting: Occupational Therapy

## 2014-03-27 ENCOUNTER — Encounter (HOSPITAL_COMMUNITY): Payer: BC Managed Care – PPO | Admitting: Occupational Therapy

## 2014-03-27 ENCOUNTER — Inpatient Hospital Stay (HOSPITAL_COMMUNITY): Payer: BC Managed Care – PPO | Admitting: Rehabilitation

## 2014-03-27 ENCOUNTER — Inpatient Hospital Stay (HOSPITAL_COMMUNITY): Payer: BC Managed Care – PPO

## 2014-03-27 LAB — PROTIME-INR
INR: 2.12 — ABNORMAL HIGH (ref 0.00–1.49)
PROTHROMBIN TIME: 23.1 s — AB (ref 11.6–15.2)

## 2014-03-27 LAB — GLUCOSE, CAPILLARY
GLUCOSE-CAPILLARY: 170 mg/dL — AB (ref 70–99)
Glucose-Capillary: 115 mg/dL — ABNORMAL HIGH (ref 70–99)
Glucose-Capillary: 141 mg/dL — ABNORMAL HIGH (ref 70–99)
Glucose-Capillary: 108 mg/dL — ABNORMAL HIGH (ref 70–99)
Glucose-Capillary: 183 mg/dL — ABNORMAL HIGH (ref 70–99)

## 2014-03-27 MED ORDER — WARFARIN SODIUM 4 MG PO TABS
4.0000 mg | ORAL_TABLET | Freq: Once | ORAL | Status: AC
  Administered 2014-03-27: 4 mg via ORAL
  Filled 2014-03-27: qty 1

## 2014-03-27 NOTE — Progress Notes (Signed)
Occupational Therapy Session Note  Patient Details  Name: Robin BaizeJudith M Eoff MRN: [id number] Date of Birth: 06/11/1951  Today's Date: 03/27/2014 Time: 1000-1100 Time Calculation (min): 60 min  Short Term Goals: Week 1:  OT Short Term Goal 1 (Week 1): Pt will perform LB bathing sit to stand with supervision. OT Short Term Goal 2 (Week 1): Pt will perform LB dressing sit to stand with supervision using AE. OT Short Term Goal 3 (Week 1): Pt will perform toilet transfers with supervision to elevated toilet or 3:1. OT Short Term Goal 4 (Week 1): Pt will perform 2 grooming tasks in standing with supervision. OT Short Term Goal 5 (Week 1): Pt will transfer from supine with HOB elevated with min assist in preparation for selfcare tasks.   Skilled Therapeutic Interventions/Progress Updates:    1:1 self care retraining at sink level . Pt able to complete UB B/D with setup. Focus on sit to stand and standign balance at sink for peri care and clothing management. During peri hygiene pt became incontient of bowel. Pt required +2 for sit to stand in bathroom and transfer to toilet with BSC over it due to anxiety and difficulty getting feet underneath of her. Pt required A to thread pants due to ulna boots. Pt then required max to come into standing and mod A to maintain forward posture. Mod A to achieve forward posture with sit to stands, pt does not come forward enough.   1:1 2nd session: pain in sacrum and in LEs- RN aware and already got meds 13:30-14:00 Goal of session to work on toilet transfers with RW and sit to stands, however when in bathroom pt required total A to come into partial stand and unable to transfer hand from w/c arm rest to RW and required total A to maintain position. Had to return to sitting due to inability to come into full upright stance. Attempted sit to stands 5-6 more times and still unable to stand with total A of 1 person and total A +2. Pt unable to straighten legs to come into  stance. Pt required max A to scoot bottom back int to chair. Pt in w/c and notified nursing. Left with call bell ini hand  Therapy Documentation Precautions:  Precautions Precautions: Fall Precaution Comments: Bilateral Una boots Restrictions Weight Bearing Restrictions: Yes Pain: Pain Assessment Pain Score: 2  Pain Type: Acute pain Pain Location: Sacrum Pain Descriptors / Indicators: Aching Pain Intervention(s): Medication (See eMAR)  See FIM for current functional status  Therapy/Group: Individual Therapy  Roney MansSmith, Jennifer [facility] 03/27/2014, 2:21 PM

## 2014-03-27 NOTE — Progress Notes (Signed)
Removed foley at 1800. Yeast evident. Pt complains of irritation. Will continue to monitor I&Os

## 2014-03-27 NOTE — Patient Care Conference (Signed)
Inpatient RehabilitationTeam Conference and Plan of Care Update Date: 03/27/2014   Time: 11:25 AM    Patient Name: Robin [name]      Medical Record Number: [id number]  Date of Birth: 07/08/1951 Sex: Female         Room/Bed: 4W06C/4W06C-01 Payor Info: Payor: [employer]UE [employer]UE [employer] / Plan: [employer] PPO / Product Type: *No Product type* /    Admitting Diagnosis: Deconditioned BLE cellutitis  MRSA  Admit Date/Time:  03/22/2014  6:27 PM Admission Comments: No comment available   Primary Diagnosis:  <principal problem not specified> Principal Problem: <principal problem not specified>  Patient Active Problem List   Diagnosis Date Noted  . Physical deconditioning 03/22/2014  . Ulcers of both lower legs, chronic venous stasis 03/21/2014  . Hyperkalemia 03/16/2014  . Hyponatremia 03/16/2014  . DKA (diabetic ketoacidoses) 03/16/2014  . Sepsis 03/16/2014  . Atrial fibrillation 08/07/2013  . Varicose veins of lower extremities with ulcer 10/25/2011  . Varicose veins of lower extremities with other complications 10/18/2011  . Venous insufficiency 07/12/2011    Expected Discharge Date: Expected Discharge Date: 04/06/14  Team Members Present: Physician leading conference: Dr. Claudette LawsAndrew Kirsteins Social Worker Present: Dossie DerBecky Arlisha Patalano, LCSW Nurse Present: Carlean PurlMaryann Barbour, RN PT Present: Harriet ButteEmily Parcell, PT OT Present: Perrin MalteseJames McGuire, OT SLP Present: Other (comment) Joni Reining(Nicole Page-SP) PPS Coordinator present : Tora DuckMarie Noel, RN, CRRN     Current Status/Progress Goal Weekly Team Focus  Medical   multiple pain complaints  improve pain level during therapy  med management   Bowel/Bladder   Foley in place. Continent of bowel, LBM 03/26/14  To be continent od bladder and bowel  Once foley cath removed-tiimed toileting during day   Swallow/Nutrition/ Hydration     WFL        ADL's   sit to stands mod to max A , standing balance min A, requires extra time for ADL tasks, UB B/D with setup-  min A, LB with max A  overall mod I   sit to stands, bed mobility, standing balance, basic transfers, activity tolerance, LB B/D   Mobility   currently requires mod/max A for bed mobility, mod/max A for sit<>stand, once standing requires min A for transfers and gait with RW.  Pt with very limited endurance and overall generalized weakness  Mod I to supervision overall  bed mobility, transfers, gait, activity tolerance, strengthening.    Communication     NA        Safety/Cognition/ Behavioral Observations    No unsafe behaviors        Pain   C/o pain x1, Tylenol 650mg  given, Norco ordered for pain   Pain <3/10  Assess for and treat pain every shift, PRN pain med ordered    Skin   Cellulitis of abd. bil LE, Groin reddened, Stage 3 on bottom foam dsg in palce  No new skin breakdown  Assess skin every shift for breakdown      *See Care Plan and progress notes for long and short-term goals.  Barriers to Discharge: morbid obesity    Possible Resolutions to Barriers:  See above    Discharge Planning/Teaching Needs:  Home with husband and son, will be alone during the day , needs to be mod/i level      Team Discussion:  D/C foley today and see if voids.  L-shoulder pain treating. Dressing changes every Friday. Making progress main issue is sit /stand, once up she does well.    Revisions to  Treatment Plan:  None   Continued Need for Acute Rehabilitation Level of Care: The patient requires daily medical management by a physician with specialized training in physical medicine and rehabilitation for the following conditions: Daily direction of a multidisciplinary physical rehabilitation program to ensure safe treatment while eliciting the highest outcome that is of practical value to the patient.: Yes Daily medical management of patient stability for increased activity during participation in an intensive rehabilitation regime.: Yes Daily analysis of laboratory values and/or radiology  reports with any subsequent need for medication adjustment of medical intervention for : Other  Lucy ChrisDupree, Darivs Lunden G 03/27/2014, 1:28 PM

## 2014-03-27 NOTE — Progress Notes (Signed)
Physical Therapy Session Note  Patient Details  Name: Robin [name] MRN: [id number] Date of Birth: 07/01/1951  Today's Date: 03/27/2014 Time: 0930-1008 Time Calculation (min): 38 min  Short Term Goals: Week 1:  PT Short Term Goal 1 (Week 1): Pt will perform bed mobility with min A.  PT Short Term Goal 2 (Week 1): Pt will perform bed <> w/c tranfers with min A.  PT Short Term Goal 3 (Week 1): Pt will ambulate 25 ft using LRAD with min A.  PT Short Term Goal 4 (Week 1): Pt will negotiate up/down 3 stairs using 2 rails with min A.  PT Short Term Goal 5 (Week 1): Pt will propel w/c 150 ft with supervision.   Skilled Therapeutic Interventions/Progress Updates:   Pt received lying in bed this morning, agreeable to therapy session, however with c/o increased pain in L shoulder due to poor position in bed during night.  Skilled session focused on bed mobility with HOB mostly flat and without bed rails to better simulate home environment and functional transfers to w/c with RW.  Performed bed mobility with max A this morning due to not being able to reach and grab EOB with LUE.  Pt able to recall lowering LEs out of bed, however requires assist and also assist for elevating trunk.  Once at EOB, pt with continued increased pain and need to sit and rest before scooting to EOB and transferring.  Requires max assist for scooting L hip to EOB.  +2 assist (pt assist 50%) from elevated bed to perform stand pivot with RW to chair.  Cues for upright posture and stepping sequence.  Pt left in w/c with all needs in reach and OT in room for next session.   Therapy Documentation Precautions:  Precautions Precautions: Fall Precaution Comments: Bilateral Una boots Restrictions Weight Bearing Restrictions: Yes   Pain: Pain Assessment Pain Score: 5  Pain Type: Acute pain Pain Location: Shoulder Pain Orientation: Left Pain Descriptors / Indicators: Aching Patients Stated Pain Goal: 3 Pain Intervention(s):  Medication (See eMAR)     See FIM for current functional status  Therapy/Group: Individual Therapy  Vista Deckarcell, Emily Ann 03/27/2014, 11:12 AM

## 2014-03-27 NOTE — Progress Notes (Signed)
[facility] PHYSICAL MEDICINE & REHABILITATION     PROGRESS NOTE    Subjective/Complaints: Woke up "sore all over" Upon further questioning mainly Left side neck Long day in therapy yest, pt feels she is progressing   Review of Systems - Negative except as above Objective: Vital Signs: Blood pressure 96/64, pulse 66, temperature 98 F (36.7 C), temperature source Oral, resp. rate 18, height _0  (1.676 m), weight 123.378 kg (272 lb), SpO2 98.00%. No results found.  Recent Labs  03/25/14 0426  WBC 8.6  HGB 8.2*  HCT 29.1*  PLT 311    Recent Labs  03/25/14 0426  NA 133*  K 3.7  CL 96  GLUCOSE 108*  BUN 63*  CREATININE 1.80*  CALCIUM 8.2*   CBG (last 3)   Recent Labs  03/26/14 2108 03/26/14 2109 03/27/14 0737  GLUCAP 44* 170* 115*    Wt Readings from Last 3 Encounters:  03/26/14 123.378 kg (272 lb)  03/20/14 111.5 kg (245 lb 13 oz)  10/25/11 134.265 kg (296 lb)    Physical Exam:  Constitutional: She is oriented to person, place, and time. obese  HENT: oral mucosa pink and moist  Head: Normocephalic.  Eyes: EOM are normal.  Neck: Normal range of motion. Neck supple. No thyromegaly present.  Cardiovascular:  Cardiac rate controlled. No murmur  Respiratory: Effort normal and breath sounds normal. No respiratory distress.  GI: Soft. Bowel sounds are normal. She exhibits no distension.  Musculoskeletal:  1+ bilateral LE , bilateral unna dressings in place Neurological: She is alert and oriented to person, place, and time.  Follows full commands. Alert and appropriate. No sensory deficits except for the feet which remain dimnished to light touch.Skin:  Bilateral lower extremities with Unna boots. Scattered ecchymoses in UE's. A few abrasions areas over toes are visible beyond unna's. Psychiatric: She is a little anxious but generally appropriate and pleasant Neck has decreased range of motion with lateral bending toward the left and rotation to the left.  Mild tenderness to palpation along the cervical paraspinal muscles and lateral masses on left side. Upper extremity strength 4/5 bilateral deltoid, bicep, tricep, grip extremity strength 3/5 bilateral hip flexors 4/5 knee extensor ankle dorsi flexion plantar flexor Assessment/Plan: 1. Functional deficits secondary to deconditioning after cellulitis/sepsis which require 3+ hours per day of interdisciplinary therapy in a comprehensive inpatient rehab setting. Physiatrist is providing close team supervision and 24 hour management of active medical problems listed below. Physiatrist and rehab team continue to assess barriers to discharge/monitor patient progress toward functional and medical goals. Team conference today please see physician documentation under team conference tab, met with team face-to-face to discuss problems,progress, and goals. Formulized individual treatment plan based on medical history, underlying problem and comorbidities. FIM: FIM - Bathing Bathing Steps Patient Completed: Chest;Right Arm;Left Arm;Abdomen;Front perineal area;Right upper leg;Left upper leg Bathing: 4: Min-Patient completes 8-9 23f10 parts or 75+ percent  FIM - Upper Body Dressing/Undressing Upper body dressing/undressing: 5: Set-up assist to: Obtain clothing/put away FIM - Lower Body Dressing/Undressing Lower body dressing/undressing: 1: Total-Patient completed less than 25% of tasks  FIM - Toileting Toileting: 3: Mod-Patient completed 2 of 3 steps  FIM - TRadio producerDevices: Bedside commode;Grab bars;Walker (over regular toilet) Toilet Transfers: 3-To toilet/BSC: Mod A (lift or lower assist);3-From toilet/BSC: Mod A (lift or lower assist)  FIM - BControl and instrumentation engineerDevices: Walker;Bed rails Bed/Chair Transfer: 2: Supine > Sit: Max A (lifting assist/Pt. 25-49%);3: Sit > Supine:  Mod A (lifting assist/Pt. 50-74%/lift 2 legs);3: Bed > Chair or W/C:  Mod A (lift or lower assist);3: Chair or W/C > Bed: Mod A (lift or lower assist)  FIM - Locomotion: Wheelchair Distance: 80 Locomotion: Wheelchair: 0: Activity did not occur FIM - Locomotion: Ambulation Locomotion: Ambulation Assistive Devices: Administrator Ambulation/Gait Assistance: 4: Min assist Locomotion: Ambulation: 1: Travels less than 50 ft with minimal assistance (Pt.>75%)  Comprehension Comprehension Mode: Auditory Comprehension: 7-Follows complex conversation/direction: With no assist  Expression Expression Mode: Verbal Expression: 6-Expresses complex ideas: With extra time/assistive device  Social Interaction Social Interaction: 6-Interacts appropriately with others with medication or extra time (anti-anxiety, antidepressant).  Problem Solving Problem Solving: 6-Solves complex problems: With extra time  Memory Memory: 6-More than reasonable amt of time Medical Problem List and Plan:  1. Functional deficits secondary to Deconditioning after multi-medical iisssues  2. DVT Prophylaxis/Anticoagulation: Chronic Coumadin therapy for atrial fibrillation. Monitor for any bleeding episodes  3. Pain Management: Hydrocodone as needed. Monitor with increased mobility  4. Mood/depression: Wellbutrin 300 mg daily, xanax to 0.45m tid prn.  5. Neuropsych: This patient is capable of making decisions on his own behalf.  6. Atrial fibrillation. Continue Coumadin therapy as directed. Cardiac rate control. No chest pain or shortness of breath at present 7. Hypertension. Lasix 40 mg daily, Lopressor 12.5 mg twice a day. Monitor with increased mobility  8. AKI/hyponatremia/hyperkalemia. Followup per renal services. Labs are normalizing.   Creatinine baseline 1.32 --labs improving  -recheck labs in am 9. Acute on chronic anemia. Latest hemoglobin 8.6.  10. Chronic leg ulcers. Empiric doxycycline/Cipro. Routine skin care per [facility] -may be able to dc unna's soon- will need comp hose  vs ACE wraps  11. Diabetes mellitus with peripheral neuropathy. Hemoglobin A1c 7.2. Lantus insulin 30 units each bedtime. Check CBGs a.c. and at bedtime.   -sugars acceptable 12. Hyperlipidemia. Zocor  13. MRSA PCR screening positive. Contact precautions      LOS (Days) 5 A FACE TO FACE EVALUATION WAS PERFORMED  KCharlett Blake6/24/2015 8:57 AM

## 2014-03-27 NOTE — Progress Notes (Signed)
ANTICOAGULATION CONSULT NOTE - Follow Up Consult  Pharmacy Consult for Coumadin Indication: atrial fibrillation  No Known Allergies  Patient Measurements: Height: 5\' 6"  (167.6 cm) Weight: 272 lb (123.378 kg) IBW/kg (Calculated) : 59.3  Vital Signs: Temp: 98 F (36.7 C) (06/24 0507) Temp src: Oral (06/24 0507) BP: 96/64 mmHg (06/24 0507) Pulse Rate: 66 (06/24 0507)  Labs:  Recent Labs  03/25/14 0426 03/26/14 0435 03/27/14 0500  HGB 8.2*  --   --   HCT 29.1*  --   --   PLT 311  --   --   LABPROT 23.1* 22.7* 23.1*  INR 2.12* 2.08* 2.12*  CREATININE 1.80*  --   --     Estimated Creatinine Clearance: 44 ml/min (by C-G formula based on Cr of 1.8).  Assessment: [name] on Coumadin for Afib. INR 2.12. Hg 8.2, plts stable; 6/16 FOB(+). NO active bleeding currently. Pt continues on ciprofloxacin and doxycycline which may potentiate effects of coumadin.  PTA dose = 5mg  daily  Goal of Therapy:  INR 2-3 Monitor platelets by anticoagulation protocol: Yes   Plan:  1. Coumadin 4 mg tonight 2. Daily INR  Bayard HuggerMei Bell, PharmD, BCPS  Clinical Pharmacist  Pager: [phone]   03/27/2014 9:55 AM

## 2014-03-27 NOTE — Progress Notes (Signed)
Physical Therapy Session Note  Patient Details  Name: Jewel BaizeJudith M Walz MRN: [id number] Date of Birth: 12/22/1950  Today's Date: 03/27/2014 Time: [id number] Time Calculation (min): 25 min  Skilled Therapeutic Interventions/Progress Updates:  1:1. Pt received semi-reclined in bed, ready for therapy. Pt w/ minimal complaints of pain. Pt completed general supine B LE strengthening program, 2-3 reps of each exercise, with use of handout for visual cues and min multimodal cues for improved technique. Good tolerance overall. Education provided regarding benefits and performance when not with therapy, pt verbalized understanding. Pt left semi-reclined in bed, all needs in reach.   Therapy Documentation Precautions:  Precautions Precautions: Fall Precaution Comments: Bilateral Una boots Restrictions Weight Bearing Restrictions: Yes  See FIM for current functional status  Therapy/Group: Individual Therapy  Denzil HughesKing, Caroline S 03/27/2014, 5:07 PM

## 2014-03-27 NOTE — Progress Notes (Signed)
Physical Therapy Session Note  Patient Details  Name: Jewel BaizeJudith M Boswell MRN: [id number] Date of Birth: 01/13/1951  Today's Date: 03/27/2014 Time: [id number] Time Calculation (min): 50 min  Short Term Goals: Week 1:  PT Short Term Goal 1 (Week 1): Pt will perform bed mobility with min A.  PT Short Term Goal 2 (Week 1): Pt will perform bed <> w/c tranfers with min A.  PT Short Term Goal 3 (Week 1): Pt will ambulate 25 ft using LRAD with min A.  PT Short Term Goal 4 (Week 1): Pt will negotiate up/down 3 stairs using 2 rails with min A.  PT Short Term Goal 5 (Week 1): Pt will propel w/c 150 ft with supervision.   Skilled Therapeutic Interventions/Progress Updates:   Pt received sitting in w/c in room.  Per discussion with OT, pt with increased difficulty with attempting to stand and was unable to do so during OT session, despite having +2 assist.  Pt agreeable to session and requesting to get back into bed at end of session.  Skilled session focused on gait training with RW, functional transfers with RW and bed mobility, as well as verbal education and handout of HEP.  Performed 10' x 2 reps with lengthy seated rest break in between with +2 assist (chair follow, pt assist 70%) with cues for upright posture, increased step lengths and facilitation for adequate weight shifts.  Ended session with stand pivot transfer with RW w/c>bed with +2 assist (pt assist 20% to stand, but 70% during transfer).  Requires +2 assist to elevate LEs into bed and also to control descent of trunk.  Pt left in bed with all needs in reach.   Therapy Documentation Precautions:  Precautions Precautions: Fall Precaution Comments: Bilateral Una boots Restrictions Weight Bearing Restrictions: Yes   Vital Signs: Therapy Vitals Temp: 98.2 F (36.8 C) Temp src: Oral Pulse Rate: 79 Resp: 18 BP: 100/66 mmHg Patient Position (if appropriate): Sitting Oxygen Therapy SpO2: 95 % O2 Device: None (Room air) Pain: Pain  Assessment Pain Score: 2  Pain Type: Acute pain Pain Location: Sacrum Pain Descriptors / Indicators: Aching Pain Intervention(s): Medication (See eMAR)   Locomotion : Ambulation Ambulation/Gait Assistance: 4: Min assist;1: +2 Total assist (+2 for chair follow)   See FIM for current functional status  Therapy/Group: Individual Therapy  Vista Deckarcell, Emily Ann 03/27/2014, 4:31 PM

## 2014-03-27 NOTE — Progress Notes (Signed)
Social Work Patient ID: Robin [name], female   DOB: 1950-12-27, 63 y.o.   MRN: [id number] Met with pt to inform of team conference goals-mod/i-supervision and discharge 7/4.  She is concerned about her sodium level, RN is checking on for pt. She wants to continue to do well and make progress, she acknowledges her main issue is sit to stand, once she is up she does well.  She will be home alone during the  Day and wants to do well.  Will continue to work on discharge plans and see how she progresses.

## 2014-03-28 ENCOUNTER — Inpatient Hospital Stay (HOSPITAL_COMMUNITY): Payer: BC Managed Care – PPO | Admitting: Rehabilitation

## 2014-03-28 ENCOUNTER — Encounter (HOSPITAL_COMMUNITY): Payer: BC Managed Care – PPO | Admitting: Occupational Therapy

## 2014-03-28 ENCOUNTER — Inpatient Hospital Stay (HOSPITAL_COMMUNITY): Payer: BC Managed Care – PPO | Admitting: Occupational Therapy

## 2014-03-28 DIAGNOSIS — A419 Sepsis, unspecified organism: Secondary | ICD-10-CM

## 2014-03-28 DIAGNOSIS — E669 Obesity, unspecified: Secondary | ICD-10-CM

## 2014-03-28 DIAGNOSIS — L97909 Non-pressure chronic ulcer of unspecified part of unspecified lower leg with unspecified severity: Secondary | ICD-10-CM

## 2014-03-28 DIAGNOSIS — I4891 Unspecified atrial fibrillation: Secondary | ICD-10-CM

## 2014-03-28 DIAGNOSIS — E1110 Type 2 diabetes mellitus with ketoacidosis without coma: Secondary | ICD-10-CM

## 2014-03-28 DIAGNOSIS — R5381 Other malaise: Secondary | ICD-10-CM

## 2014-03-28 LAB — PROTIME-INR
INR: 2.64 — AB (ref 0.00–1.49)
Prothrombin Time: 28.2 seconds — ABNORMAL HIGH (ref 11.6–15.2)

## 2014-03-28 LAB — GLUCOSE, CAPILLARY
GLUCOSE-CAPILLARY: 44 mg/dL — AB (ref 70–99)
GLUCOSE-CAPILLARY: 109 mg/dL — AB (ref 70–99)
GLUCOSE-CAPILLARY: 133 mg/dL — AB (ref 70–99)
GLUCOSE-CAPILLARY: 127 mg/dL — AB (ref 70–99)
Glucose-Capillary: 163 mg/dL — ABNORMAL HIGH (ref 70–99)

## 2014-03-28 MED ORDER — WARFARIN SODIUM 2 MG PO TABS
2.0000 mg | ORAL_TABLET | Freq: Once | ORAL | Status: AC
  Administered 2014-03-28: 2 mg via ORAL
  Filled 2014-03-28: qty 1

## 2014-03-28 NOTE — Progress Notes (Signed)
[facility] PHYSICAL MEDICINE & REHABILITATION     PROGRESS NOTE    Subjective/Complaints: Pt without new issues, neck better Used Kpad   Review of Systems - Negative except as above Objective: Vital Signs: Blood pressure 85/51, pulse 70, temperature 98.5 F (36.9 C), temperature source Oral, resp. rate 18, height 5\' 6"  (1.676 m), weight 112.492 kg (248 lb), SpO2 96.00%. No results found. No results found for this basename: WBC, HGB, HCT, PLT,  in the last 72 hours No results found for this basename: NA, K, CL, CO, GLUCOSE, BUN, CREATININE, CALCIUM,  in the last 72 hours CBG (last 3)   Recent Labs  03/27/14 1648 03/27/14 2042 03/28/14 0719  GLUCAP 108* 183* 109*    Wt Readings from Last 3 Encounters:  03/27/14 112.492 kg (248 lb)  03/20/14 111.5 kg (245 lb 13 oz)  10/25/11 134.265 kg (296 lb)    Physical Exam:  Constitutional: She is oriented to person, place, and time. obese  HENT: oral mucosa pink and moist  Head: Normocephalic.  Eyes: EOM are normal.  Neck: Normal range of motion. Neck supple. No thyromegaly present.  Cardiovascular:  Cardiac rate controlled. No murmur  Respiratory: Effort normal and breath sounds normal. No respiratory distress.  GI: Soft. Bowel sounds are normal. She exhibits no distension.  Musculoskeletal:  1+ bilateral LE , bilateral unna dressings in place Neurological: She is alert and oriented to person, place, and time.  Follows full commands. Alert and appropriate. No sensory deficits except for the feet which remain dimnished to light touch.Skin:  Bilateral lower extremities with Unna boots. Scattered ecchymoses in UE's. A few abrasions areas over toes are visible beyond unna's. Psychiatric: She is a little anxious but generally appropriate and pleasant Neck has decreased range of motion with lateral bending toward the left and rotation to the left. Mild tenderness to palpation along the cervical paraspinal muscles and lateral masses on  left side. Upper extremity strength 4/5 bilateral deltoid, bicep, tricep, grip extremity strength 3/5 bilateral hip flexors 4/5 knee extensor ankle dorsi flexion plantar flexor Assessment/Plan: 1. Functional deficits secondary to deconditioning after cellulitis/sepsis which require 3+ hours per day of interdisciplinary therapy in a comprehensive inpatient rehab setting. Physiatrist is providing close team supervision and 24 hour management of active medical problems listed below. Physiatrist and rehab team continue to assess barriers to discharge/monitor patient progress toward functional and medical goals.  FIM: FIM - Bathing Bathing Steps Patient Completed: Chest;Right Arm;Left Arm;Abdomen;Front perineal area;Right upper leg;Left upper leg Bathing: 4: Min-Patient completes 8-9 433f 10 parts or 75+ percent  FIM - Upper Body Dressing/Undressing Upper body dressing/undressing: 5: Set-up assist to: Obtain clothing/put away FIM - Lower Body Dressing/Undressing Lower body dressing/undressing: 1: Total-Patient completed less than 25% of tasks  FIM - Toileting Toileting: 3: Mod-Patient completed 2 of 3 steps  FIM - Diplomatic Services operational officerToilet Transfers Toilet Transfers Assistive Devices: Bedside commode;Grab bars;Walker (over regular toilet) Toilet Transfers: 3-To toilet/BSC: Mod A (lift or lower assist);3-From toilet/BSC: Mod A (lift or lower assist)  FIM - BankerBed/Chair Transfer Bed/Chair Transfer Assistive Devices: Walker;Bed rails Bed/Chair Transfer: 0: Activity did not occur  FIM - Locomotion: Wheelchair Distance: 80 Locomotion: Wheelchair: 0: Activity did not occur FIM - Locomotion: Ambulation Locomotion: Ambulation Assistive Devices: Designer, industrial/productWalker - Rolling Ambulation/Gait Assistance: 4: Min assist;1: +2 Total assist (+2 for chair follow) Locomotion: Ambulation: 0: Activity did not occur  Comprehension Comprehension Mode: Auditory Comprehension: 7-Follows complex conversation/direction: With no  assist  Expression Expression Mode: Verbal Expression: 6-Expresses complex  ideas: With extra time/assistive device  Social Interaction Social Interaction: 6-Interacts appropriately with others with medication or extra time (anti-anxiety, antidepressant).  Problem Solving Problem Solving: 6-Solves complex problems: With extra time  Memory Memory: 6-More than reasonable amt of time Medical Problem List and Plan:  1. Functional deficits secondary to Deconditioning after multi-medical iisssues  2. DVT Prophylaxis/Anticoagulation: Chronic Coumadin therapy for atrial fibrillation. Monitor for any bleeding episodes  3. Pain Management: Hydrocodone as needed. Monitor with increased mobility  4. Mood/depression: Wellbutrin 300 mg daily, xanax to 0.5mg  tid prn.  5. Neuropsych: This patient is capable of making decisions on his own behalf.  6. Atrial fibrillation. Continue Coumadin therapy as directed. Cardiac rate control. No chest pain or shortness of breath at present 7. Hypertension. Lasix 40 mg daily, Lopressor 12.5 mg twice a day. Monitor with increased mobility  8. AKI/hyponatremia/hyperkalemia. Followup per renal services. Labs are normalizing.   Creatinine baseline 1.32 --labs improving  -recheck labs in am 9. Acute on chronic anemia. Latest hemoglobin 8.6.  10. Chronic leg ulcers. Empiric doxycycline/Cipro. [facility]  -may be able to dc unna's soon- will need comp hose vs ACE wraps  11. Diabetes mellitus with peripheral neuropathy. Hemoglobin A1c 7.2. Lantus insulin 30 units each bedtime. Check CBGs a.c. and at bedtime.   -sugars acceptable 12. Hyperlipidemia. Zocor  13. MRSA PCR screening positive. Contact precautions      LOS (Days) 6 A FACE TO FACE EVALUATION WAS PERFORMED  Adonnis Salceda E 03/28/2014 8:40 AM

## 2014-03-28 NOTE — Progress Notes (Signed)
Physical Therapy Session Note  Patient Details  Name: Robin BaizeJudith M Fort MRN: [id number] Date of Birth: 03/10/1951  Today's Date: 03/28/2014 Time: 1401-1430 Time Calculation (min): 29 min  Short Term Goals: Week 1:  PT Short Term Goal 1 (Week 1): Pt will perform bed mobility with min A.  PT Short Term Goal 2 (Week 1): Pt will perform bed <> w/c tranfers with min A.  PT Short Term Goal 3 (Week 1): Pt will ambulate 25 ft using LRAD with min A.  PT Short Term Goal 4 (Week 1): Pt will negotiate up/down 3 stairs using 2 rails with min A.  PT Short Term Goal 5 (Week 1): Pt will propel w/c 150 ft with supervision.   Skilled Therapeutic Interventions/Progress Updates:   Pt received sitting in w/c in therapy gym, having just finished OT session.  Note pt concerned about unna boots being too tight with increased leg swelling and redness.  Discussed that PT to discuss with nurse upon return to room.  Pt transferred to/from nustep with RW at min A level (mod A to stand from w/c).  Cues for increased step lengths, esp on LLE.  Performed seated nustep x 8 mins at level 3 resistance with BUE/LEs in order to increase overall activity tolerance, overall strength, and with hope of decreasing fluid with increased muscle pump activation.  While on nustep, pt continued to worry about legs, therefore called on MaryAnne (wound RN on unit) to assess need to remove unna boots.  RN states that she will call WOC RN or ortho tech to determine next step. Transferred back to w/c and assisted back to room. All needs in reach. LEs elevated on leg rests with pillows.   Therapy Documentation Precautions:  Precautions Precautions: Fall Precaution Comments: Bilateral Una boots Restrictions Weight Bearing Restrictions: No   Vital Signs: Therapy Vitals Temp: 98.3 F (36.8 C) Temp src: Oral Pulse Rate: 77 Resp: 17 BP: 92/43 mmHg (RN notified) Patient Position (if appropriate): Sitting Oxygen Therapy SpO2: 97 % O2  Device: None (Room air) Pain: Pain Assessment Pain Assessment: Faces Faces Pain Scale: Hurts a little bit Pain Type: Acute pain Pain Location: Leg Pain Intervention(s): Repositioned  See FIM for current functional status  Therapy/Group: Individual Therapy  Vista Deckarcell, Emily Ann 03/28/2014, 4:47 PM

## 2014-03-28 NOTE — Plan of Care (Signed)
Problem: RH SKIN INTEGRITY Goal: RH STG SKIN FREE OF INFECTION/BREAKDOWN Free of further skin breakdown and infection with mod assist  Outcome: Progressing Some redness noted above unna boots; CN aware paged ortho tech to change unna boots today instead of tomorrow (schedule  for change)

## 2014-03-28 NOTE — Progress Notes (Signed)
Orthopedic Tech Progress Note Patient Details:  Robin BaizeJudith M [name] 08/03/1951 [id number]  Ortho Devices Type of Ortho Device: Roland RackUnna boot Ortho Device/Splint Location: bilateral Ortho Device/Splint Interventions: Application   Nikki DomCrawford, Rembert 03/28/2014, 4:16 PM

## 2014-03-28 NOTE — Progress Notes (Signed)
Physical Therapy Session Note  Patient Details  Name: Robin [name] MRN: [id number] Date of Birth: 11/10/1950  Today's Date: 03/28/2014 Time:  -     Short Term Goals: Week 1:  PT Short Term Goal 1 (Week 1): Pt will perform bed mobility with min A.  PT Short Term Goal 2 (Week 1): Pt will perform bed <> w/c tranfers with min A.  PT Short Term Goal 3 (Week 1): Pt will ambulate 25 ft using LRAD with min A.  PT Short Term Goal 4 (Week 1): Pt will negotiate up/down 3 stairs using 2 rails with min A.  PT Short Term Goal 5 (Week 1): Pt will propel w/c 150 ft with supervision.   Skilled Therapeutic Interventions/Progress Updates:   Pt received lying in bed this morning, nursing in room in order to re-check BP, as nurse tech had reported it being low this morning.  Note BP was 90/40 taken with dynamap.  Discussed sitting pt at EOB in order to further evaluate BP.  RN provided verbal okay.  Pt states she needs to use restroom prior to getting up, therefore assisted with rolling at mod//max A in order to place bedpan for increased safety.  Pt successful in voiding bowel and and small amount of urine.  Assisted with peri care and changed bed pad due to being soiled.  Note area of breakdown not covered, therefore had RN come to room in order to assess skin and need for dressing.  RN placed foam padding dressing.  Assisted pt to EOB with max A with max cues for technique.  Sat at EOB x 1 min then checked BP and was 91/49, however following 4-5 mins was 90/41, therefore did not attempt to get OOB to chair and assisted back to bed at max A.  Left pt in bed with all needs in reach.  RN notified of BP.   Therapy Documentation Precautions:  Precautions Precautions: Fall Precaution Comments: Bilateral Una boots Restrictions Weight Bearing Restrictions: Yes   Vital Signs: Therapy Vitals Temp: 98.5 F (36.9 C) Temp src: Oral Pulse Rate: 70 Resp: 18 BP: 85/51 mmHg Patient Position (if appropriate):  Lying Oxygen Therapy SpO2: 96 % O2 Device: None (Room air) Pain: Pt with increased pain in back when getting back to bed.      See FIM for current functional status  Therapy/Group: Individual Therapy  Vista Deckarcell, Andros Channing Ann 03/28/2014, 8:47 AM

## 2014-03-28 NOTE — Progress Notes (Signed)
Occupational Therapy Session Note  Patient Details  Name: Robin BaizeJudith M Stelzer MRN: [id number] Date of Birth: 05/23/1951  Today's Date: 03/28/2014 Time: [id number] Time Calculation (min): 55 min  Short Term Goals: Week 1:  OT Short Term Goal 1 (Week 1): Pt will perform LB bathing sit to stand with supervision. OT Short Term Goal 2 (Week 1): Pt will perform LB dressing sit to stand with supervision using AE. OT Short Term Goal 3 (Week 1): Pt will perform toilet transfers with supervision to elevated toilet or 3:1. OT Short Term Goal 4 (Week 1): Pt will perform 2 grooming tasks in standing with supervision. OT Short Term Goal 5 (Week 1): Pt will transfer from supine with HOB elevated with min assist in preparation for selfcare tasks.   Skilled Therapeutic Interventions/Progress Updates:    Pt worked on AE use sitting in the wheelchair this session.  She was able to use the reacher with supervision and mod instructional cueing to remove her gripper socks.  Utilized sockaide with max assist as Una boots cause too much friction to use successfully.  Pt does demonstrate and voice understanding of AE use.  She currently is not able to donn shoes secondary to increased swelling in her feet.  Took pt down to the tub/shower room as well during session and discussed tub bench options.  Based on pt's current shower on the first level feel she may need a special bench that attaches to the tub itself instead of one with legs.  Instructed her to have her husband measure the side of the tub so therapist would be able to further determine which piece of equipment would be most beneficial.   Therapy Documentation Precautions:  Precautions Precautions: Fall Precaution Comments: Bilateral Una boots Restrictions Weight Bearing Restrictions: No General: General Missed Time Reason: Other (comment) (using bed pan)  Pain: Pain Assessment Pain Assessment: Faces Faces Pain Scale: Hurts a little bit Pain Type:  Acute pain Pain Location: Leg Pain Intervention(s): Repositioned ADL: See FIM for current functional status  Therapy/Group: Individual Therapy  Mattison Stuckey OTR/L 03/28/2014, 3:45 PM

## 2014-03-28 NOTE — Progress Notes (Signed)
Occupational Therapy Session Note  Patient Details  Name: Robin [name] MRN: [id number] Date of Birth: 07/15/1951  Today's Date: 03/28/2014 Time: [id number] Time Calculation (min): 53 min  Short Term Goals: Week 1:  OT Short Term Goal 1 (Week 1): Pt will perform LB bathing sit to stand with supervision. OT Short Term Goal 2 (Week 1): Pt will perform LB dressing sit to stand with supervision using AE. OT Short Term Goal 3 (Week 1): Pt will perform toilet transfers with supervision to elevated toilet or 3:1. OT Short Term Goal 4 (Week 1): Pt will perform 2 grooming tasks in standing with supervision. OT Short Term Goal 5 (Week 1): Pt will transfer from supine with HOB elevated with min assist in preparation for selfcare tasks.   Skilled Therapeutic Interventions/Progress Updates:    Pt performed bathing and dressing sitting on the edge of the bed.  BP taken in supine with HOB elevated 95/42.  Once pt transferred to the EOB and it was taken again at 110/57.  Pt bathed in sitting on the EOB.with setup for UB.  Pt did not attempt LB as she requested to attempt toileting.  She was able to transfer stand pivot with the RW to the wide 3:1 with min assist.  She needed total assist +2 (pt 60%) for toilet hygiene during session.  Pt's BP was taken after transfer at 120/60.  She transferred to wheelchair after receiving max assist for managing clothing post toileting, with min assist as well.  Pt with increased difficulty moving her RLE when stepping forward and backward toward the wheelchair.  NT assisted with finishing up with pt.    Therapy Documentation Precautions:  Precautions Precautions: Fall Precaution Comments: Bilateral Una boots Restrictions Weight Bearing Restrictions: No  Vital Signs: Therapy Vitals BP: 120/60 mmHg (per therapist) Pain: Pain Assessment Pain Assessment: Faces Faces Pain Scale: Hurts a little bit Pain Type: Acute pain Pain Location: Back Pain Intervention(s):  Repositioned;Emotional support ADL: See FIM for current functional status  Therapy/Group: Individual Therapy  MCGUIRE,JAMES OTR/L 03/28/2014, 12:17 PM

## 2014-03-28 NOTE — Progress Notes (Signed)
ANTICOAGULATION CONSULT NOTE - Follow Up Consult  Pharmacy Consult for Coumadin Indication: atrial fibrillation  No Known Allergies  Patient Measurements: Height: 5\' 6"  (167.6 cm) Weight: 248 lb (112.492 kg) IBW/kg (Calculated) : 59.3  Vital Signs: Temp: 98.5 F (36.9 C) (06/25 0500) Temp src: Oral (06/25 0500) BP: 90/40 mmHg (06/25 0940) Pulse Rate: 70 (06/25 0500)  Labs:  Recent Labs  03/26/14 0435 03/27/14 0500 03/28/14 0452  LABPROT 22.7* 23.1* 28.2*  INR 2.08* 2.12* 2.64*    Estimated Creatinine Clearance: 41.8 ml/min (by C-G formula based on Cr of 1.8).  Assessment: [name] on Coumadin for Afib. INR 2.12 > 2.64. Hg 8.2, plts stable on 6/22; 6/16 FOB(+). NO active bleeding currently. Pt continues on ciprofloxacin and doxycycline which may potentiate effects of coumadin.  PTA dose = 5mg  daily  Goal of Therapy:  INR 2-3 Monitor platelets by anticoagulation protocol: Yes   Plan:  1. Coumadin 2 mg tonight 2. Daily INR  Bayard HuggerMei Bell, PharmD, BCPS  Clinical Pharmacist  Pager: [phone]   03/28/2014 9:57 AM

## 2014-03-29 ENCOUNTER — Inpatient Hospital Stay (HOSPITAL_COMMUNITY): Payer: BC Managed Care – PPO | Admitting: Occupational Therapy

## 2014-03-29 ENCOUNTER — Inpatient Hospital Stay (HOSPITAL_COMMUNITY): Payer: BC Managed Care – PPO | Admitting: Rehabilitation

## 2014-03-29 ENCOUNTER — Encounter (HOSPITAL_COMMUNITY): Payer: BC Managed Care – PPO | Admitting: Occupational Therapy

## 2014-03-29 ENCOUNTER — Inpatient Hospital Stay (HOSPITAL_COMMUNITY): Payer: BC Managed Care – PPO | Admitting: Physical Therapy

## 2014-03-29 DIAGNOSIS — A419 Sepsis, unspecified organism: Secondary | ICD-10-CM

## 2014-03-29 DIAGNOSIS — E669 Obesity, unspecified: Secondary | ICD-10-CM

## 2014-03-29 DIAGNOSIS — I4891 Unspecified atrial fibrillation: Secondary | ICD-10-CM

## 2014-03-29 DIAGNOSIS — R5381 Other malaise: Secondary | ICD-10-CM

## 2014-03-29 DIAGNOSIS — E1110 Type 2 diabetes mellitus with ketoacidosis without coma: Secondary | ICD-10-CM

## 2014-03-29 DIAGNOSIS — L97909 Non-pressure chronic ulcer of unspecified part of unspecified lower leg with unspecified severity: Secondary | ICD-10-CM

## 2014-03-29 LAB — GLUCOSE, CAPILLARY
GLUCOSE-CAPILLARY: 116 mg/dL — AB (ref 70–99)
GLUCOSE-CAPILLARY: 103 mg/dL — AB (ref 70–99)
GLUCOSE-CAPILLARY: 176 mg/dL — AB (ref 70–99)
GLUCOSE-CAPILLARY: 139 mg/dL — AB (ref 70–99)
Glucose-Capillary: 97 mg/dL (ref 70–99)
Glucose-Capillary: 129 mg/dL — ABNORMAL HIGH (ref 70–99)

## 2014-03-29 LAB — PROTIME-INR
INR: 2.87 — ABNORMAL HIGH (ref 0.00–1.49)
Prothrombin Time: 30.1 seconds — ABNORMAL HIGH (ref 11.6–15.2)

## 2014-03-29 MED ORDER — WARFARIN SODIUM 1 MG PO TABS
1.0000 mg | ORAL_TABLET | Freq: Once | ORAL | Status: AC
  Administered 2014-03-29: 1 mg via ORAL
  Filled 2014-03-29: qty 1

## 2014-03-29 NOTE — Progress Notes (Signed)
Physical Therapy Session Note  Patient Details  Name: Robin [name] MRN: [id number] Date of Birth: 02/20/1951  Today's Date: 03/29/2014 Time: 1000-1103 Time Calculation (min): 63 min  Short Term Goals: Week 1:  PT Short Term Goal 1 (Week 1): Pt will perform bed mobility with min A.  PT Short Term Goal 2 (Week 1): Pt will perform bed <> w/c tranfers with min A.  PT Short Term Goal 3 (Week 1): Pt will ambulate 25 ft using LRAD with min A.  PT Short Term Goal 4 (Week 1): Pt will negotiate up/down 3 stairs using 2 rails with min A.  PT Short Term Goal 5 (Week 1): Pt will propel w/c 150 ft with supervision.   Skilled Therapeutic Interventions/Progress Updates:   Pt received sitting in w/c in room.  Pt stating that she is having good day and that it was easier to stand with OT earlier.  Skilled session focused on gait in controlled and carpeted environment and also bed mobility in ADL apt to better simulate home and ending with stair negotiation to determine need for ramp at home.  Performed 35' x 1 and 25' x 1 and another 15' x 1 with RW at min A (mod A to stand) with cues for upright posture and also for increased step width and length.  Pt able to tolerate increased distance today and with increased safety and balance vs previous sessions.  Performed bed mobility at mod A with log rolling and SL technique in order to decrease strain on back.  PT, pt and CSW discussed possible need for ramp at time of D/C.  Discussed that we will continue to work on stairs here at rehab, however for increased safety to go ahead and have ramp built.  Pt verbalized understanding.  Ended session with negotiation of 3, 4" steps with R handrail sideways with mod/max A.  Cues for sequencing, technique and hand placement.  Due to increased difficulty, continue to recommend ramp.  Assisted pt back to room and left in w/c with all needs in reach.   Therapy Documentation Precautions:  Precautions Precautions:  Fall Precaution Comments: Bilateral Una boots Restrictions Weight Bearing Restrictions: No   Pain: Pain Assessment Pain Score: 4      See FIM for current functional status  Therapy/Group: Individual Therapy  Vista Deckarcell, Emily Ann 03/29/2014, 12:30 PM

## 2014-03-29 NOTE — Progress Notes (Signed)
Social Work Patient ID: Robin BaizeJudith M [name], female   DOB: 02/23/1951, 63 y.o.   MRN: [id number] Spoke with Janet-[employer] regarding pt's coverage.  She will approve until 6/30 and need an update if she feels the pt is not progressing she will send to her Medical Director and we will go from there.  Will alert pt and husband regarding this and work on a plan if pt doesn't reach mod/i level for home while husband is working during the day.  Will also ask PT if ramp is needed for home and will begin on obtaining one.

## 2014-03-29 NOTE — Progress Notes (Signed)
Occupational Therapy Session Note  Patient Details  Name: Robin [name] MRN: [id number] Date of Birth: 02/17/1951  Today's Date: 03/29/2014 Time: [id number] Time Calculation (min): 56 min  Short Term Goals: Week 1:  OT Short Term Goal 1 (Week 1): Pt will perform LB bathing sit to stand with supervision. OT Short Term Goal 2 (Week 1): Pt will perform LB dressing sit to stand with supervision using AE. OT Short Term Goal 3 (Week 1): Pt will perform toilet transfers with supervision to elevated toilet or 3:1. OT Short Term Goal 4 (Week 1): Pt will perform 2 grooming tasks in standing with supervision. OT Short Term Goal 5 (Week 1): Pt will transfer from supine with HOB elevated with min assist in preparation for selfcare tasks.   Skilled Therapeutic Interventions/Progress Updates:    Pt began session by working on UE strengthening using the UE ergonometer.  Began with 3 minute interval with resistance set on level 6 and RPMs maintained at 15.  For second set had pt increase RPMs to 20 and maintain for approximately 3 mins as well.  Final set was for 3 mins with resistance increased to level 9 and RPMs maintained at 15-17.  Pt with 2 minute rest break in-between sets.  HR post activity 90 BPM with O2 sats at 95% on room air.  Pt reporting a level 12-13 on the Borg Perceived Exertion Scale.  Progressed to working on sit to stand transitions for second part of the session.  Focused on forward trunk flexion and forward weightshift while standing from lower mat.  She needed mod assist initially but progressed to only needing min assist for the final attempt.  Pt then transferred back to her wheelchair and then back to the room for conclusion of session.    Therapy Documentation Precautions:  Precautions Precautions: Fall Precaution Comments: Bilateral Una boots Restrictions Weight Bearing Restrictions: No  Pain: Pain Assessment Pain Assessment: 0-10 Pain Score: 6  Pain Type: Acute pain Pain  Location: Buttocks Pain Orientation: Right;Left Pain Descriptors / Indicators: Aching Pain Frequency: Intermittent Pain Onset: Gradual Patients Stated Pain Goal: 2 Pain Intervention(s): Medication (See eMAR) ADL: See FIM for current functional status  Therapy/Group: Individual Therapy  MCGUIRE,JAMES OTR/L 03/29/2014, 5:07 PM

## 2014-03-29 NOTE — Progress Notes (Signed)
Occupational Therapy Session Note  Patient Details  Name: Robin [name] MRN: [id number] Date of Birth: 01/27/1951  Today's Date: 03/29/2014 Time: 1300-1330 Time Calculation (min): 30 min  Short Term Goals: Week 1:  OT Short Term Goal 1 (Week 1): Pt will perform LB bathing sit to stand with supervision. OT Short Term Goal 2 (Week 1): Pt will perform LB dressing sit to stand with supervision using AE. OT Short Term Goal 3 (Week 1): Pt will perform toilet transfers with supervision to elevated toilet or 3:1. OT Short Term Goal 4 (Week 1): Pt will perform 2 grooming tasks in standing with supervision. OT Short Term Goal 5 (Week 1): Pt will transfer from supine with HOB elevated with min assist in preparation for selfcare tasks.   Skilled Therapeutic Interventions/Progress Updates: Therapeutic exercise with focus on LE strengthening.  Patient received in her w/c, finishing noon meal and anticipating therapist arrival.   Patient verbalizes awareness of bil LE weakness as primary deficit and requested continued therex, using NuStep to promote strengthening.   Patient required extensive setup assist to manage devices (w/c and RW) with extra time to perform stand-pivot transfer to NuStep and setup assist to place feet on footplates.   Patient performed a total of 6 minutes of therex using NuStep for 194 steps with adjustments required during task for improved comfort at right foot.  Patient provided with written settings for NuStep to improve efficiency of treatments.  Patient was escorted back to her room in her w/c with call light and bedside table within reach.  Therapy Documentation Precautions:  Precautions Precautions: Fall Precaution Comments: Bilateral Una boots Restrictions Weight Bearing Restrictions: No  Pain: Pain Assessment Pain Assessment: No/denies pain  Exercises: Cardiovascular Exercises NuStep: 6 min, level 1, 194 total steps  See FIM for current functional  status  Therapy/Group: Individual Therapy  BARTHOLD,FRANK 03/29/2014, 2:29 PM

## 2014-03-29 NOTE — Progress Notes (Signed)
[facility] PHYSICAL MEDICINE & REHABILITATION     PROGRESS NOTE    Subjective/Complaints: No new issues New Unna boots feel better Hands feel "swollen"   Review of Systems - Negative except as above Objective: Vital Signs: Blood pressure 98/63, pulse 84, temperature 98.5 F (36.9 C), temperature source Oral, resp. rate 18, height 5\' 6"  (1.676 m), weight 112.492 kg (248 lb), SpO2 95.00%. No results found. No results found for this basename: WBC, HGB, HCT, PLT,  in the last 72 hours No results found for this basename: NA, K, CL, CO, GLUCOSE, BUN, CREATININE, CALCIUM,  in the last 72 hours CBG (last 3)   Recent Labs  03/29/14 0425 03/29/14 0611 03/29/14 0747  GLUCAP 97 116* 103*    Wt Readings from Last 3 Encounters:  03/27/14 112.492 kg (248 lb)  03/20/14 111.5 kg (245 lb 13 oz)  10/25/11 134.265 kg (296 lb)   BMET    Component Value Date/Time   NA 133* 03/25/2014 0426   K 3.7 03/25/2014 0426   CL 96 03/25/2014 0426   CO2 23 03/25/2014 0426   GLUCOSE 108* 03/25/2014 0426   BUN 63* 03/25/2014 0426   CREATININE 1.80* 03/25/2014 0426   CALCIUM 8.2* 03/25/2014 0426   GFRNONAA 29* 03/25/2014 0426   GFRAA 34* 03/25/2014 0426    Physical Exam:  Constitutional: She is oriented to person, place, and time. obese  HENT: oral mucosa pink and moist  Head: Normocephalic.  Eyes: EOM are normal.  Neck: Normal range of motion. Neck supple. No thyromegaly present.  Cardiovascular:  Cardiac rate controlled.Irreg irreg  No murmur  Respiratory: Effort normal and breath sounds normal. No respiratory distress.  GI: Soft. Bowel sounds are normal. She exhibits no distension.  Musculoskeletal:  1+ bilateral LE , bilateral unna dressings in place Neurological: She is alert and oriented to person, place, and time.  Follows full commands. Alert and appropriate. No sensory deficits except for the feet which remain dimnished to light touch.Skin:  Bilateral lower extremities with Unna boots.  Scattered ecchymoses in UE's. A few abrasions areas over toes are visible beyond unna's. Psychiatric: She is a little anxious but generally appropriate and pleasant Neck has decreased range of motion with lateral bending toward the left and rotation to the left. Mild tenderness to palpation along the cervical paraspinal muscles and lateral masses on left side. Upper extremity strength 4/5 bilateral deltoid, bicep, tricep, grip extremity strength 3/5 bilateral hip flexors 4/5 knee extensor ankle dorsi flexion plantar flexor Assessment/Plan: 1. Functional deficits secondary to deconditioning after cellulitis/sepsis which require 3+ hours per day of interdisciplinary therapy in a comprehensive inpatient rehab setting. Physiatrist is providing close team supervision and 24 hour management of active medical problems listed below. Physiatrist and rehab team continue to assess barriers to discharge/monitor patient progress toward functional and medical goals.  FIM: FIM - Bathing Bathing Steps Patient Completed: Chest;Right Arm;Left Arm;Abdomen;Right upper leg;Left upper leg Bathing: 3: Mod-Patient completes 5-7 1437f 10 parts or 50-74%  FIM - Upper Body Dressing/Undressing Upper body dressing/undressing steps patient completed: Thread/unthread right sleeve of pullover shirt/dresss;Thread/unthread left sleeve of pullover shirt/dress;Put head through opening of pull over shirt/dress;Pull shirt over trunk Upper body dressing/undressing: 5: Supervision: Safety issues/verbal cues FIM - Lower Body Dressing/Undressing Lower body dressing/undressing: 1: Total-Patient completed less than 25% of tasks  FIM - Toileting Toileting: 1: Two helpers  FIM - Diplomatic Services operational officerToilet Transfers Toilet Transfers Assistive Devices: Building control surveyorBedside commode;Walker Toilet Transfers: 4-To toilet/BSC: Min A (steadying Pt. > 75%);4-From toilet/BSC: Min  A (steadying Pt. > 75%)  FIM - Bed/Chair Transfer Bed/Chair Transfer Assistive Devices: Bed  rails Bed/Chair Transfer: 4: Supine > Sit: Min A (steadying Pt. > 75%/lift 1 leg);4: Bed > Chair or W/C: Min A (steadying Pt. > 75%)  FIM - Locomotion: Wheelchair Distance: 80 Locomotion: Wheelchair: 0: Activity did not occur FIM - Locomotion: Ambulation Locomotion: Ambulation Assistive Devices: Designer, industrial/productWalker - Rolling Ambulation/Gait Assistance: 4: Min assist;1: +2 Total assist (+2 for chair follow) Locomotion: Ambulation: 0: Activity did not occur  Comprehension Comprehension Mode: Auditory Comprehension: 7-Follows complex conversation/direction: With no assist  Expression Expression Mode: Verbal Expression: 6-Expresses complex ideas: With extra time/assistive device  Social Interaction Social Interaction: 6-Interacts appropriately with others with medication or extra time (anti-anxiety, antidepressant).  Problem Solving Problem Solving: 6-Solves complex problems: With extra time  Memory Memory: 6-More than reasonable amt of time Medical Problem List and Plan:  1. Functional deficits secondary to Deconditioning after multi-medical iisssues  2. DVT Prophylaxis/Anticoagulation: Chronic Coumadin therapy for atrial fibrillation. Monitor for any bleeding episodes  3. Pain Management: Hydrocodone as needed. Monitor with increased mobility  4. Mood/depression: Wellbutrin 300 mg daily, xanax to 0.5mg  tid prn.  5. Neuropsych: This patient is capable of making decisions on his own behalf.  6. Atrial fibrillation. Continue Coumadin therapy as directed. Cardiac rate control. No chest pain or shortness of breath at present 7. Hypertension. Lasix 40 mg daily,Stopped Lopressor 12.5 mg twice a day due to Low BP. Monitor with increased mobility  8. AKI/hyponatremia/hyperkalemia. Followup per renal services. Labs are normalizing.   Creatinine baseline 1.32 --labs improving  -monitor 9. Acute on chronic anemia. Latest hemoglobin 8.6.  10. Chronic leg ulcers. Empiric doxycycline/Cipro. [facility]  -may be able to dc unna's soon- will need comp hose vs ACE wraps  11. Diabetes mellitus with peripheral neuropathy. Hemoglobin A1c 7.2. Lantus insulin 30 units each bedtime. Check CBGs a.c. and at bedtime.   -sugars acceptable 12. Hyperlipidemia. Zocor  13. MRSA PCR screening positive. Contact precautions      LOS (Days) 7 A FACE TO FACE EVALUATION WAS PERFORMED  KIRSTEINS,ANDREW E 03/29/2014 8:22 AM

## 2014-03-29 NOTE — Progress Notes (Signed)
Social Work Patient ID: Robin [name], female   DOB: March 26, 1951, 63 y.o.   MRN: [id number] Met with pt and Emily-PT to inform the insurance concerns and if she would need a ramp for home, she reports she can have one in a day or two, if needed. She is having a good day today and moving better.  Will work on discharge plans and continue to keep pt and husband updated regarding insurance.

## 2014-03-29 NOTE — Progress Notes (Signed)
ANTICOAGULATION CONSULT NOTE - Follow Up Consult  Pharmacy Consult for Coumadin Indication: atrial fibrillation  No Known Allergies  Patient Measurements: Height: 5\' 6"  (167.6 cm) Weight: 248 lb (112.492 kg) IBW/kg (Calculated) : 59.3  Vital Signs: Temp: 98.5 F (36.9 C) (06/26 0500) Temp src: Oral (06/26 0500) BP: 98/63 mmHg (06/26 0500) Pulse Rate: 84 (06/26 0500)  Labs:  Recent Labs  03/27/14 0500 03/28/14 0452 03/29/14 0610  LABPROT 23.1* 28.2* 30.1*  INR 2.12* 2.64* 2.87*    Estimated Creatinine Clearance: 41.8 ml/min (by C-G formula based on Cr of 1.8).  Assessment: [name] on Coumadin for Afib. INR 2.87. Hg 8.2, plts stable on 6/22; 6/16 FOB(+). NO active bleeding currently. Pt continues on ciprofloxacin and doxycycline which may potentiate effects of coumadin.  PTA dose = 5mg  daily  Goal of Therapy:  INR 2-3 Monitor platelets by anticoagulation protocol: Yes   Plan:  1. Coumadin 1 mg tonight 2. Daily INR  Bayard HuggerMei Bell, PharmD, BCPS  Clinical Pharmacist  Pager: [phone]   03/29/2014 10:24 AM

## 2014-03-29 NOTE — Progress Notes (Signed)
Occupational Therapy Session Note  Patient Details  Name: Robin [name] MRN: [id number] Date of Birth: 01/04/1951  Today's Date: 03/29/2014 Time: [id number] Time Calculation (min): 56 min  Short Term Goals: Week 1:  OT Short Term Goal 1 (Week 1): Pt will perform LB bathing sit to stand with supervision. OT Short Term Goal 2 (Week 1): Pt will perform LB dressing sit to stand with supervision using AE. OT Short Term Goal 3 (Week 1): Pt will perform toilet transfers with supervision to elevated toilet or 3:1. OT Short Term Goal 4 (Week 1): Pt will perform 2 grooming tasks in standing with supervision. OT Short Term Goal 5 (Week 1): Pt will transfer from supine with HOB elevated with min assist in preparation for selfcare tasks.   Skilled Therapeutic Interventions/Progress Updates:    Pt performed transfer to the EOB with mod assist and then to the wheelchair with the RW and mod assist as well.  Performed UB bathing and dressing with supervision and increased time.  She was able to donn her underpants and pants with mod assist using the reacher to help her thread them over her feet.  Pt had already had her LEs and peri area bathed earlier so did not attempt this session.  Pt still needing mod assist with sit to stand from the wheelchair and mod instructional cueing to scoot forward and for hand placement on the arms of the chair.    Therapy Documentation Precautions:  Precautions Precautions: Fall Precaution Comments: Bilateral Una boots Restrictions Weight Bearing Restrictions: No  Pain: Pain Assessment Pain Assessment: No/denies pain Pain Score: 4  ADL: See FIM for current functional status  Therapy/Group: Individual Therapy  MCGUIRE,JAMES OTR/L 03/29/2014, 12:41 PM

## 2014-03-30 ENCOUNTER — Inpatient Hospital Stay (HOSPITAL_COMMUNITY): Payer: BC Managed Care – PPO | Admitting: Occupational Therapy

## 2014-03-30 DIAGNOSIS — R5381 Other malaise: Secondary | ICD-10-CM

## 2014-03-30 DIAGNOSIS — L97909 Non-pressure chronic ulcer of unspecified part of unspecified lower leg with unspecified severity: Secondary | ICD-10-CM

## 2014-03-30 DIAGNOSIS — I4891 Unspecified atrial fibrillation: Secondary | ICD-10-CM

## 2014-03-30 LAB — GLUCOSE, CAPILLARY
GLUCOSE-CAPILLARY: 146 mg/dL — AB (ref 70–99)
Glucose-Capillary: 98 mg/dL (ref 70–99)
Glucose-Capillary: 132 mg/dL — ABNORMAL HIGH (ref 70–99)
Glucose-Capillary: 128 mg/dL — ABNORMAL HIGH (ref 70–99)

## 2014-03-30 LAB — PROTIME-INR
INR: 2.97 — ABNORMAL HIGH (ref 0.00–1.49)
PROTHROMBIN TIME: 30.9 s — AB (ref 11.6–15.2)

## 2014-03-30 MED ORDER — FLUCONAZOLE 150 MG PO TABS
150.0000 mg | ORAL_TABLET | Freq: Once | ORAL | Status: AC
  Administered 2014-03-30: 150 mg via ORAL
  Filled 2014-03-30: qty 1

## 2014-03-30 MED ORDER — NYSTATIN 100000 UNIT/GM EX POWD
Freq: Two times a day (BID) | CUTANEOUS | Status: DC
  Administered 2014-03-30 – 2014-04-06 (×14): via TOPICAL
  Filled 2014-03-30: qty 15

## 2014-03-30 NOTE — Progress Notes (Signed)
ANTICOAGULATION CONSULT NOTE - Follow Up Consult  Pharmacy Consult for Coumadin Indication: atrial fibrillation  No Known Allergies  Patient Measurements: Height: 5\' 6"  (167.6 cm) Weight: 248 lb (112.492 kg) IBW/kg (Calculated) : 59.3  Vital Signs: Temp: 98.4 F (36.9 C) (06/27 0522) Temp src: Oral (06/27 0522) BP: 96/64 mmHg (06/27 0522) Pulse Rate: 74 (06/27 0522)  Labs:  Recent Labs  03/28/14 0452 03/29/14 0610 03/30/14 0438  LABPROT 28.2* 30.1* 30.9*  INR 2.64* 2.87* 2.97*    Estimated Creatinine Clearance: 41.8 ml/min (by C-G formula based on Cr of 1.8).  Assessment:  [name] admitted 6/13 with progressive weakness and worsening of leg ulcers to start broad spectrum antibiotics. INR 2.32 on admit (5mg  daily)Transferred to rehab 6/19.  PMH of HTN, HLD, A fib on AC/coumadin, IDDM, PAD, venous insufficiency, DM  Anticoagulation: Warf-Rx: Afib; INR 2.12 > 2.64 > 2.87>2.97. Hg 8.2, plts stable on 6/22; 6/16 FOB(+). NO active bleeding currently. Pt continues on ciprofloxacin and doxycycline which may potentiate effects of coumadin.   Goal of Therapy:  INR 2-3 Monitor platelets by anticoagulation protocol: Yes   Plan:  1. Hold Coumadin x 1 day. 2. Daily INR  Nanetta Wiegman S. Merilynn Finlandobertson, PharmD, [facility] Clinical Staff Pharmacist Pager [phone]    03/30/2014 1:01 PM

## 2014-03-30 NOTE — Progress Notes (Signed)
Patient requesting meds for anxiety and pain-PRN xanax and 1 vicodin given at HS. At 0300, unable to void, I & O cath= 550.  White vaginal discharge. Right abdominal fold with red irritated skin. Foam dressing intact to sacrum.Robin MartinezMurray, Jamie A

## 2014-03-30 NOTE — Progress Notes (Signed)
Patient ID: Robin BaizeJudith M [name], female   DOB: 09/08/1951, 63 y.o.   MRN: [id number]    [place] [facility]YS[facility] MEDICINE & REHABILITATION     PROGRESS NOTE   03/30/14.  Subjective/Complaints:  63 y/o admit for CIR  With functional deficits secondary to Deconditioning after multi-medical iisssues  No new issues.  Concerned about electrolytes. New Unna boots feel better    Review of Systems - Negative except as above Objective: Vital Signs: Blood pressure 96/64, pulse 74, temperature 98.4 F (36.9 C), temperature source Oral, resp. rate 19, height 5\' 6"  (1.676 m), weight 248 lb (112.492 kg), SpO2 97.00%. No results found. No results found for this basename: WBC, HGB, HCT, PLT,  in the last 72 hours No results found for this basename: NA, K, CL, CO, GLUCOSE, BUN, CREATININE, CALCIUM,  in the last 72 hours CBG (last 3)   Recent Labs  03/29/14 1627 03/29/14 2143 03/30/14 0719  GLUCAP 176* 139* 98    Wt Readings from Last 3 Encounters:  03/27/14 248 lb (112.492 kg)  03/20/14 245 lb 13 oz (111.5 kg)  10/25/11 296 lb (134.265 kg)   BMET    Component Value Date/Time   NA 133* 03/25/2014 0426   K 3.7 03/25/2014 0426   CL 96 03/25/2014 0426   CO2 23 03/25/2014 0426   GLUCOSE 108* 03/25/2014 0426   BUN 63* 03/25/2014 0426   CREATININE 1.80* 03/25/2014 0426   CALCIUM 8.2* 03/25/2014 0426   GFRNONAA 29* 03/25/2014 0426   GFRAA 34* 03/25/2014 0426   Lab Results  Component Value Date   INR 2.97* 03/30/2014   INR 2.87* 03/29/2014   INR 2.64* 03/28/2014    Intake/Output Summary (Last 24 hours) at 03/30/14 0903 Last data filed at 03/30/14 0800  Gross per 24 hour  Intake   1060 ml  Output    552 ml  Net    508 ml   Patient Vitals for the past 24 hrs:  BP Temp Temp src Pulse Resp SpO2  03/30/14 0522 96/64 mmHg 98.4 F (36.9 C) Oral 74 19 97 %  03/29/14 2100 123/54 mmHg 98.9 F (37.2 C) Oral 81 18 100 %  03/29/14 1700 105/70 mmHg 98.6 F (37 C) Oral 86 18 98 %   Physical Exam:   Constitutional: She is oriented to person, place, and time. obese  HENT: oral mucosa pink and moist  Head: Normocephalic.  Eyes: EOM are normal.  Neck: Normal range of motion. Neck supple. No thyromegaly present.  Cardiovascular:  Cardiac rate controlled.Irreg irreg  No murmur  Respiratory: Effort normal and breath sounds normal. No respiratory distress.  GI: Soft. Bowel sounds are normal. She exhibits no distension.  Musculoskeletal:  1+ bilateral LE , bilateral unna dressings in place Neurological: She is alert and oriented to person, place, and time.   Bilateral lower extremities with Unna boots. Scattered ecchymoses in UE's. A few abrasions areas over toes are visible beyond unna's.  Neck has decreased range of motion with lateral bending toward the left and rotation to the left. Mild tenderness to palpation along the cervical paraspinal muscles and lateral masses on left side. Upper extremity strength 4/5 bilateral deltoid, bicep, tricep, grip extremity strength 3/5 bilateral hip flexors 4/5 knee extensor ankle dorsi flexion plantar flexor  Assessment/Plan:  Medical Problem List and Plan:  1. Functional deficits secondary to Deconditioning after multi-medical iisssues  2. DVT Prophylaxis/Anticoagulation: Chronic Coumadin therapy for atrial fibrillation. Monitor for any bleeding episodes  3. Pain Management: Hydrocodone  as needed. Monitor with increased mobility  4. Mood/depression: Wellbutrin 300 mg daily, xanax to 0.5mg  tid prn.  5. Neuropsych: This patient is capable of making decisions on his own behalf.  6. Atrial fibrillation. Continue Coumadin therapy as directed. Cardiac rate control. No chest pain or shortness of breath at present 7. Hypertension. Lasix 40 mg daily,Stopped Lopressor 12.5 mg twice a day due to Low BP. Monitor with increased mobility  8. AKI/hyponatremia/hyperkalemia. Followup per renal services. Labs are normalizing.   Creatinine baseline 1.32 --labs  improving  -monitor 9. Acute on chronic anemia. Latest hemoglobin 8.6.  10. Chronic leg ulcers. Empiric doxycycline/Cipro. [facility]  -may be able to dc unna's soon- will need comp hose vs ACE wraps  11. Diabetes mellitus with peripheral neuropathy. Hemoglobin A1c 7.2. Lantus insulin 30 units each bedtime. Check CBGs a.c. and at bedtime.   -sugars acceptable 12. Hyperlipidemia. Zocor  13. MRSA PCR screening positive. Contact precautions      LOS (Days) 8 A FACE TO FACE EVALUATION WAS PERFORMED  Rogelia BogaKWIATKOWSKI,Gracin Soohoo FRANK 03/30/2014 9:00 AM

## 2014-03-30 NOTE — Progress Notes (Signed)
Occupational Therapy Session Note  Patient Details  Name: Robin [name] MRN: [id number] Date of Birth: 09/16/1951  Today's Date: 03/30/2014 Time: 1450-1550 Time Calculation (min): 60 min  Short Term Goals: Week 2:     Skilled Therapeutic Interventions/Progress Updates:   Patient seen this afternoon for occupational therapy with emphasis on postural control, functional mobility, activity tolerance, standing tolerance, standing balance, energy conservation, and ADL retraining and instruction.  Patient upright in wheelchair upon therapist arrival.  Patient agreeable to treatment for ADL.  Patient completes ADL wheelchair level at the sink.  Patient completes grooming with setup.  Patient completes bathing for sponge bath to wash 3/8 body parts.  Patient's lower legs/feet wrapped and unable to be washed at this time.  Patient able to wash chest and BUEs.  Patient attempts to wash abdomen but requires assistance to thoroughly clean skin folds of abdomen; Nystatin powder applied after thorough washing/drying of areas to abdomen skin folds and under breasts.  Patient attempts to wash peri area/buttocks but unable to reach and requires assistance.  Patient able to complete clothing management down to attempt to wash; requires assistance to pull clothing up over B hips secondary to size, balance, and fatigue.  Patient with moderate to minimum assistance for sit to/from stand; improved performance with improved technique of foot placement and hand placement on wheelchair.  Patient making excellent progress toward goals but continues to be limited by poor activity tolerance, strength, obesity, and pain.  Patient would benefit from continued CIR to decrease burden of care on caregivers at discharge and improved independence.  Therapy Documentation Precautions:  Precautions Precautions: Fall Precaution Comments: Bilateral Una boots Restrictions Weight Bearing Restrictions: No Vital Signs: Therapy  Vitals Temp: 98.4 F (36.9 C) Temp src: Oral Pulse Rate: 68 Resp: 22 BP: 123/79 mmHg Patient Position (if appropriate): Sitting Oxygen Therapy SpO2: 68 % O2 Device: None (Room air) Pain: Pain Assessment Pain Assessment: 0-10 Pain Score: 8  Pain Type: Chronic pain Pain Location: Back Pain Orientation: Lower Pain Descriptors / Indicators: Aching Pain Frequency: Constant Pain Onset: On-going Pain Intervention(s): Medication (See eMAR)  See FIM for current functional status  Therapy/Group: Individual Therapy  Eber HongBrown, Jennifer L 03/30/2014, 4:33 PM

## 2014-03-31 ENCOUNTER — Inpatient Hospital Stay (HOSPITAL_COMMUNITY): Payer: BC Managed Care – PPO | Admitting: Physical Therapy

## 2014-03-31 LAB — GLUCOSE, CAPILLARY
GLUCOSE-CAPILLARY: 84 mg/dL (ref 70–99)
Glucose-Capillary: 132 mg/dL — ABNORMAL HIGH (ref 70–99)
Glucose-Capillary: 125 mg/dL — ABNORMAL HIGH (ref 70–99)
Glucose-Capillary: 156 mg/dL — ABNORMAL HIGH (ref 70–99)

## 2014-03-31 LAB — BASIC METABOLIC PANEL
BUN: 42 mg/dL — AB (ref 6–23)
CHLORIDE: 95 meq/L — AB (ref 96–112)
CO2: 27 mEq/L (ref 19–32)
Calcium: 8.1 mg/dL — ABNORMAL LOW (ref 8.4–10.5)
Creatinine, Ser: 1.43 mg/dL — ABNORMAL HIGH (ref 0.50–1.10)
GFR calc Af Amer: 45 mL/min — ABNORMAL LOW (ref >90)
GFR calc non Af Amer: 39 mL/min — ABNORMAL LOW (ref >90)
Glucose, Bld: 90 mg/dL (ref 70–99)
POTASSIUM: 3.5 meq/L — AB (ref 3.7–5.3)
Sodium: 135 mEq/L — ABNORMAL LOW (ref 137–147)

## 2014-03-31 LAB — PROTIME-INR
INR: 2.4 — AB (ref 0.00–1.49)
Prothrombin Time: 26.2 seconds — ABNORMAL HIGH (ref 11.6–15.2)

## 2014-03-31 MED ORDER — WARFARIN SODIUM 4 MG PO TABS
4.0000 mg | ORAL_TABLET | Freq: Once | ORAL | Status: AC
  Administered 2014-03-31: 4 mg via ORAL
  Filled 2014-03-31: qty 1

## 2014-03-31 MED ORDER — WHITE PETROLATUM GEL
Status: AC
  Administered 2014-03-31: 07:00:00
  Filled 2014-03-31: qty 5

## 2014-03-31 MED ORDER — POTASSIUM CHLORIDE CRYS ER 20 MEQ PO TBCR
20.0000 meq | EXTENDED_RELEASE_TABLET | Freq: Once | ORAL | Status: AC
  Administered 2014-03-31: 20 meq via ORAL
  Filled 2014-03-31 (×2): qty 1

## 2014-03-31 NOTE — Progress Notes (Signed)
Patient ID: Robin BaizeJudith M Metheny, female   DOB: 01/04/1951, 63 y.o.   MRN: [id number]  Patient ID: Robin BaizeJudith M [name], female   DOB: 04/14/1951, 63 y.o.   MRN: [id number]    [place] PHYSICAL MEDICINE & REHABILITATION     PROGRESS NOTE  03/31/14.  Subjective/Complaints:  [age] y/o admit for CIR  With functional deficits secondary to Deconditioning after multi-medical iisssues  No new issues.  Concerned about electrolytes.  Potassium 3.5 today New Unna boots feel better Vaginal d/c improved-received single dose of difucan yesterday;  Has dermatitis below lower abdominal pannus treated with nystatin powder-improved  Past Medical History  Diagnosis Date  . Diabetes mellitus   . Hypertension   . Hyperlipidemia   . Atrial fibrillation   . Sleep apnea   . Cholelithiasis   . CAD (coronary artery disease)     atrial fibrillation    Review of Systems - Negative except as above Objective: Vital Signs: Blood pressure 94/61, pulse 73, temperature 98.3 F (36.8 C), temperature source Oral, resp. rate 19, height 5\' 6"  (1.676 m), weight 248 lb (112.492 kg), SpO2 94.00%. No results found. No results found for this basename: WBC, HGB, HCT, PLT,  in the last 72 hours  Recent Labs  03/31/14 0418  NA 135*  K 3.5*  CL 95*  GLUCOSE 90  BUN 42*  CREATININE 1.43*  CALCIUM 8.1*   CBG (last 3)   Recent Labs  03/30/14 1628 03/30/14 2117 03/31/14 0713  GLUCAP 132* 128* 84    Wt Readings from Last 3 Encounters:  03/27/14 248 lb (112.492 kg)  03/20/14 245 lb 13 oz (111.5 kg)  10/25/11 296 lb (134.265 kg)   BMET    Component Value Date/Time   NA 135* 03/31/2014 0418   K 3.5* 03/31/2014 0418   CL 95* 03/31/2014 0418   CO2 27 03/31/2014 0418   GLUCOSE 90 03/31/2014 0418   BUN 42* 03/31/2014 0418   CREATININE 1.43* 03/31/2014 0418   CALCIUM 8.1* 03/31/2014 0418   GFRNONAA 39* 03/31/2014 0418   GFRAA 45* 03/31/2014 0418   Lab Results  Component Value Date   INR 2.40* 03/31/2014   INR 2.97*  03/30/2014   INR 2.87* 03/29/2014    Intake/Output Summary (Last 24 hours) at 03/31/14 0808 Last data filed at 03/31/14 0703  Gross per 24 hour  Intake    720 ml  Output    553 ml  Net    167 ml   Patient Vitals for the past 24 hrs:  BP Temp Temp src Pulse Resp SpO2  03/31/14 0538 94/61 mmHg 98.3 F (36.8 C) Oral 73 19 94 %  03/30/14 2053 106/72 mmHg 98 F (36.7 C) Oral 84 20 98 %  03/30/14 1500 123/79 mmHg 98.4 F (36.9 C) Oral 68 22 68 %   Physical Exam:  Constitutional: She is oriented to person, place, and time. obese  HENT: oral mucosa pink and moist  Head: Normocephalic.  Eyes: EOM are normal.  Neck: Normal range of motion. Neck supple. No thyromegaly present.  Cardiovascular:  Cardiac rate controlled.Irreg irreg  No murmur  Respiratory: Effort normal and breath sounds normal. No respiratory distress.  GI: Soft. Bowel sounds are normal. She exhibits no distension.  Musculoskeletal:  1+ bilateral LE , bilateral unna dressings in place Neurological: She is alert and oriented to person, place, and time.   Bilateral lower extremities with Unna boots. Scattered ecchymoses in UE's. A few abrasions areas over toes are visible beyond  unna's.  Neck has decreased range of motion with lateral bending toward the left and rotation to the left. Mild tenderness to palpation along the cervical paraspinal muscles and lateral masses on left side. Upper extremity strength 4/5 bilateral deltoid, bicep, tricep, grip extremity strength 3/5 bilateral hip flexors 4/5 knee extensor ankle dorsi flexion plantar flexor  Assessment/Plan:  Medical Problem List and Plan:  1. Functional deficits secondary to Deconditioning after multi-medical iisssues  2. DVT Prophylaxis/Anticoagulation: Chronic Coumadin therapy for atrial fibrillation. Monitor for any bleeding episodes  3. Pain Management: Hydrocodone as needed. Monitor with increased mobility  4. Mood/depression: Wellbutrin 300 mg daily, xanax to  0.5mg  tid prn.  5. Neuropsych: This patient is capable of making decisions on his own behalf.  6. Atrial fibrillation. Continue Coumadin therapy as directed. Cardiac rate control. No chest pain or shortness of breath at present 7. Hypertension. Lasix 40 mg daily,Stopped Lopressor 12.5 mg twice a day due to Low BP. Monitor with increased mobility  8. AKI/hyponatremia/hyperkalemia. Followup per renal services. Labs are normalizing.   Creatinine baseline 1.32 --labs improving.  Potassium 3.5 today (on furosemide) will give single supplement of potassium today  -monitor 9. Acute on chronic anemia. Latest hemoglobin 8.6.  10. Chronic leg ulcers. Empiric doxycycline/Cipro. [facility]  -may be able to dc unna's soon- will need comp hose vs ACE wraps  11. Diabetes mellitus with peripheral neuropathy. Hemoglobin A1c 7.2. Lantus insulin 30 units each bedtime. Check CBGs a.c. and at bedtime.   -sugars acceptable 12. Hyperlipidemia. Zocor  13. MRSA PCR screening positive. Contact precautions      LOS (Days) 9 A FACE TO FACE EVALUATION WAS PERFORMED  Rogelia BogaKWIATKOWSKI,PETER FRANK 03/31/2014 8:08 AM

## 2014-03-31 NOTE — Progress Notes (Signed)
Physical Therapy Session Note  Patient Details  Name: Robin [name] MRN: [id number] Date of Birth: 10/29/1950  Today's Date: 03/31/2014 Time: 1103-1208 Time Calculation (min): 65 min  Short Term Goals: Week 1:  PT Short Term Goal 1 (Week 1): Pt will perform bed mobility with min A.  PT Short Term Goal 2 (Week 1): Pt will perform bed <> w/c tranfers with min A.  PT Short Term Goal 3 (Week 1): Pt will ambulate 25 ft using LRAD with min A.  PT Short Term Goal 4 (Week 1): Pt will negotiate up/down 3 stairs using 2 rails with min A.  PT Short Term Goal 5 (Week 1): Pt will propel w/c 150 ft with supervision.   Skilled Therapeutic Interventions/Progress Updates:   Pt received in w/c, amenable to therapy. Pt still most limited in transfers from lower surfaces, benefiting most from cues for weight shift. Pt demonstrates improvement with gait being able to self pace gait activities, necessary for safe home negotiation.  Pt would continue to benefit from skilled PT services to increase functional mobility.  Therapy Documentation Precautions:  Precautions Precautions: Fall Precaution Comments: Bilateral Una boots Restrictions Weight Bearing Restrictions: No Pain:  Pt denies Mobility:  Mod A for transfers throughout session, except Min A/Min guard from raised mat with RW 3x5 performed Locomotion : Ambulation Ambulation/Gait Assistance: 4: Min assist 6330' with RW Other Treatments:  Pt performs anterior weight shifts 3x5 with ventilatory cues and weight shifts 3x5 with swiss ball. Pt performs partial sit to stand transfers 2x5 with B/L with swiss ball. Pt educated on role of weight shift with transfers and limitations of using valsalva technique. Quad stretch with tibiofemoral mobs and glute stretch with contract relax performed 3x30". Seated knee flexion ROM performed 3x10. Pt educated on looking into fall alert systems for safety at home.   See FIM for current functional  status  Therapy/Group: Individual Therapy  Christia ReadingKinney, Steven G 03/31/2014, 12:52 PM

## 2014-03-31 NOTE — Progress Notes (Signed)
ANTICOAGULATION CONSULT NOTE - Follow Up Consult  Pharmacy Consult for Coumadin Indication: atrial fibrillation  No Known Allergies  Patient Measurements: Height: 5\' 6"  (167.6 cm) Weight: 248 lb (112.492 kg) IBW/kg (Calculated) : 59.3  Vital Signs: Temp: 98.3 F (36.8 C) (06/28 0538) Temp src: Oral (06/28 0538) BP: 94/61 mmHg (06/28 0538) Pulse Rate: 73 (06/28 0538)  Labs:  Recent Labs  03/29/14 0610 03/30/14 0438 03/31/14 0418  LABPROT 30.1* 30.9* 26.2*  INR 2.87* 2.97* 2.40*  CREATININE  --   --  1.43*    Estimated Creatinine Clearance: 52.6 ml/min (by C-G formula based on Cr of 1.43).  Assessment: [name] admitted 6/13 with progressive weakness and worsening of leg ulcers to start broad spectrum antibiotics. INR 2.32 on admit (5mg  daily)Transferred to rehab 6/19 for deconditioning.  PMH of HTN, HLD, A fib on AC/coumadin, IDDM, PAD, venous insufficiency, DM  Anticoagulation: Warf-Rx: Afib; INR 2.12 > 2.64 > 2.87>2.97>>2.4. Hg 8.2, plts stable on 6/22; 6/16 FOB(+). NO active bleeding currently. Pt continues on ciprofloxacin and doxycycline which may potentiate effects of coumadin.   Goal of Therapy:  INR 2-3 Monitor platelets by anticoagulation protocol: Yes   Plan:  1. Coumadin 4mg  po x 1 today. 2. Daily INR  Crystal S. Merilynn Finlandobertson, PharmD, [facility] Clinical Staff Pharmacist Pager [phone]    03/31/2014 10:52 AM

## 2014-04-01 ENCOUNTER — Encounter (HOSPITAL_COMMUNITY): Payer: BC Managed Care – PPO | Admitting: Occupational Therapy

## 2014-04-01 ENCOUNTER — Inpatient Hospital Stay (HOSPITAL_COMMUNITY): Payer: BC Managed Care – PPO | Admitting: Rehabilitation

## 2014-04-01 ENCOUNTER — Inpatient Hospital Stay (HOSPITAL_COMMUNITY): Payer: BC Managed Care – PPO | Admitting: Occupational Therapy

## 2014-04-01 DIAGNOSIS — A419 Sepsis, unspecified organism: Secondary | ICD-10-CM

## 2014-04-01 DIAGNOSIS — E669 Obesity, unspecified: Secondary | ICD-10-CM

## 2014-04-01 DIAGNOSIS — L97909 Non-pressure chronic ulcer of unspecified part of unspecified lower leg with unspecified severity: Secondary | ICD-10-CM

## 2014-04-01 DIAGNOSIS — I4891 Unspecified atrial fibrillation: Secondary | ICD-10-CM

## 2014-04-01 DIAGNOSIS — E1110 Type 2 diabetes mellitus with ketoacidosis without coma: Secondary | ICD-10-CM

## 2014-04-01 DIAGNOSIS — R5381 Other malaise: Secondary | ICD-10-CM

## 2014-04-01 LAB — GLUCOSE, CAPILLARY
GLUCOSE-CAPILLARY: 105 mg/dL — AB (ref 70–99)
Glucose-Capillary: 121 mg/dL — ABNORMAL HIGH (ref 70–99)
Glucose-Capillary: 159 mg/dL — ABNORMAL HIGH (ref 70–99)
Glucose-Capillary: 172 mg/dL — ABNORMAL HIGH (ref 70–99)

## 2014-04-01 LAB — PROTIME-INR
INR: 2.08 — ABNORMAL HIGH (ref 0.00–1.49)
Prothrombin Time: 23.4 seconds — ABNORMAL HIGH (ref 11.6–15.2)

## 2014-04-01 MED ORDER — ALPRAZOLAM 0.5 MG PO TABS
0.5000 mg | ORAL_TABLET | Freq: Three times a day (TID) | ORAL | Status: DC
  Administered 2014-04-01 – 2014-04-06 (×15): 0.5 mg via ORAL
  Filled 2014-04-01 (×4): qty 1
  Filled 2014-04-01: qty 2
  Filled 2014-04-01 (×10): qty 1

## 2014-04-01 MED ORDER — WARFARIN SODIUM 4 MG PO TABS
4.0000 mg | ORAL_TABLET | Freq: Once | ORAL | Status: AC
  Administered 2014-04-01: 4 mg via ORAL
  Filled 2014-04-01: qty 1

## 2014-04-01 NOTE — Progress Notes (Signed)
Physical Therapy Session Note  Patient Details  Name: Robin [name] MRN: [id number] Date of Birth: 03/22/1951  Today's Date: 04/01/2014 Time: 2952-84131442-1530 Time Calculation (min): 48 min  Short Term Goals: Week 2:  PT Short Term Goal 1 (Week 2): =LTG's   Skilled Therapeutic Interventions/Progress Updates:   Pt received in therapy gym seated in w/c, having just finished OT session.  Note that she was almost done completing game of Wii bowling, therefore had pt stand for 3 frames to increase standing tolerance.  Pt now able to stand at min A level with mod verbal and demonstration cues in order to show how much forward trunk lean and weight shift is needed to be able to stand.  Pt verbalized understanding, however requires repeated practice in order for carryover.   Performed 20' x 1 and another 25' x 1 gait with RW at min/guard to close S level.  Cues for upright posture and increased step lengths B.  Discussed that she is going to have middle rail put up at home (in order to reach both at same time) rather than have ramp installed, therefore performed 3, 4" steps with B handrails with mod A and mod verbal cues for stepping sequence and hand placement.  Discussed that she will need to have husband come in Thursday or Friday or both in order to have him practice these things with her to ensure safe D/C.  Pt states that her husband will be able to help and that he can come in this week.  Ended session with 2 reps sit<>stand without RW to assist in front of pt for increased anterior weight shift.  Performed both at min A level.  Assisted back to room and left in w/c with all needs in reach.   Therapy Documentation Precautions:  Precautions Precautions: Fall Precaution Comments: Bilateral Una boots Restrictions Weight Bearing Restrictions: No General: Amount of Missed PT Time (min): 12 Minutes Missed Time Reason: Other (comment) (therapist late from previous session)   Pain: Pain  Assessment Pain Score: 4  Pain Type: Acute pain Pain Location: Leg Pain Orientation: Right;Left;Upper Pain Descriptors / Indicators: Discomfort Pain Intervention(s): Medication (See eMAR)  See FIM for current functional status  Therapy/Group: Individual Therapy  Vista Deckarcell, Emily Ann 04/01/2014, 3:32 PM

## 2014-04-01 NOTE — Progress Notes (Signed)
[facility]     PROGRESS NOTE    Subjective/Complaints: No new issues New Unna boots feel better Hands feel "swollen"   Review of Systems - Negative except as above Objective: Vital Signs: Blood pressure 100/67, pulse 78, temperature 98.1 F (36.7 C), temperature source Oral, resp. rate 17, height 5\' 6"  (1.676 m), weight 112.492 kg (248 lb), SpO2 95.00%. No results found. No results found for this basename: WBC, HGB, HCT, PLT,  in the last 72 hours  Recent Labs  03/31/14 0418  NA 135*  K 3.5*  CL 95*  GLUCOSE 90  BUN 42*  CREATININE 1.43*  CALCIUM 8.1*   CBG (last 3)   Recent Labs  03/31/14 1726 03/31/14 2050 04/01/14 0745  GLUCAP 125* 156* 105*    Wt Readings from Last 3 Encounters:  03/27/14 112.492 kg (248 lb)  03/20/14 111.5 kg (245 lb 13 oz)  10/25/11 134.265 kg (296 lb)   BMET    Component Value Date/Time   NA 135* 03/31/2014 0418   K 3.5* 03/31/2014 0418   CL 95* 03/31/2014 0418   CO2 27 03/31/2014 0418   GLUCOSE 90 03/31/2014 0418   BUN 42* 03/31/2014 0418   CREATININE 1.43* 03/31/2014 0418   CALCIUM 8.1* 03/31/2014 0418   GFRNONAA 39* 03/31/2014 0418   GFRAA 45* 03/31/2014 0418    Physical Exam:  Constitutional: She is oriented to person, place, and time. obese  HENT: oral mucosa pink and moist  Head: Normocephalic.  Eyes: EOM are normal.  Neck: Normal range of motion. Neck supple. No thyromegaly present.  Cardiovascular:  Cardiac rate controlled.Irreg irreg  No murmur  Respiratory: Effort normal and breath sounds normal. No respiratory distress.  GI: Soft. Bowel sounds are normal. She exhibits no distension.  Musculoskeletal:  1+ bilateral LE , bilateral unna dressings in place Neurological: She is alert and oriented to person, place, and time.  Follows full commands. Alert and appropriate. No sensory deficits except for the feet which remain dimnished to light touch.Skin:  Bilateral lower extremities with  Unna boots. Scattered ecchymoses in UE's. A few abrasions areas over toes are visible beyond unna's. Psychiatric: She is a little anxious but generally appropriate and pleasant Neck has decreased range of motion with lateral bending toward the left and rotation to the left. Mild tenderness to palpation along the cervical paraspinal muscles and lateral masses on left side. Upper extremity strength 4/5 bilateral deltoid, bicep, tricep, grip extremity strength 3/5 bilateral hip flexors 4/5 knee extensor ankle dorsi flexion plantar flexor Assessment/Plan: 1. Functional deficits secondary to deconditioning after cellulitis/sepsis which require 3+ hours per day of interdisciplinary therapy in a comprehensive inpatient rehab setting. Physiatrist is providing close team supervision and 24 hour management of active medical problems listed below. Physiatrist and rehab team continue to assess barriers to discharge/monitor patient progress toward functional and medical goals. Discussed med f/u with wound care and PCP FIM: FIM - Bathing Bathing Steps Patient Completed: Chest;Right Arm;Left Arm Bathing: 3: Mod-Patient completes 5-7 [id number] 10 parts or 50-74% (pt completes 3/8 steps)  FIM - Upper Body Dressing/Undressing Upper body dressing/undressing steps patient completed: Thread/unthread right sleeve of front closure shirt/dress;Thread/unthread left sleeve of front closure shirt/dress;Pull shirt around back of front closure shirt/dress;Button/unbutton shirt Upper body dressing/undressing: 5: Supervision: Safety issues/verbal cues FIM - Lower Body Dressing/Undressing Lower body dressing/undressing steps patient completed: Pull pants up/down;Pull underwear up/down Lower body dressing/undressing: 3: Mod-Patient completed 50-74% of tasks  FIM - Toileting Toileting: 1:  Two helpers  FIM - Diplomatic Services operational officerToilet Transfers Toilet Transfers Assistive Devices: Building control surveyorBedside commode;Walker Toilet Transfers: 4-To toilet/BSC: Min A  (steadying Pt. > 75%);4-From toilet/BSC: Min A (steadying Pt. > 75%)  FIM - Bed/Chair Transfer Bed/Chair Transfer Assistive Devices: Therapist, occupationalWalker Bed/Chair Transfer: 3: Chair or W/C > Bed: Mod A (lift or lower assist);3: Bed > Chair or W/C: Mod A (lift or lower assist)  FIM - Locomotion: Wheelchair Distance: 80 Locomotion: Wheelchair: 0: Activity did not occur FIM - Locomotion: Ambulation Locomotion: Ambulation Assistive Devices: Designer, industrial/productWalker - Rolling Ambulation/Gait Assistance: 4: Min assist Locomotion: Ambulation: 1: Travels less than 50 ft with minimal assistance (Pt.>75%)  Comprehension Comprehension Mode: Auditory Comprehension: 7-Follows complex conversation/direction: With no assist  Expression Expression Mode: Verbal Expression: 6-Expresses complex ideas: With extra time/assistive device  Social Interaction Social Interaction: 6-Interacts appropriately with others with medication or extra time (anti-anxiety, antidepressant).  Problem Solving Problem Solving: 6-Solves complex problems: With extra time  Memory Memory: 6-More than reasonable amt of time Medical Problem List and Plan:  1. Functional deficits secondary to Deconditioning after multi-medical iisssues  2. DVT Prophylaxis/Anticoagulation: Chronic Coumadin therapy for atrial fibrillation. Monitor for any bleeding episodes  3. Pain Management: Hydrocodone as needed. Monitor with increased mobility  4. Mood/depression: Wellbutrin 300 mg daily, xanax to 0.5mg  tid prn.  5. Neuropsych: This patient is capable of making decisions on his own behalf.  6. Atrial fibrillation. Continue Coumadin therapy as directed. Cardiac rate control. No chest pain or shortness of breath at present 7. Hypertension. Lasix 40 mg daily,Stopped Lopressor 12.5 mg twice a day due to Low BP. Monitor with increased mobility  8. AKI/hyponatremia/hyperkalemia. Followup per renal services. Labs are normalizing.   Creatinine baseline 1.32 --labs  improving  -monitor 9. Acute on chronic anemia. Latest hemoglobin 8.6.  10. Chronic leg ulcers. Empiric doxycycline/Cipro. [facility]  -may be able to dc unna's soon- will need comp hose vs ACE wraps  11. Diabetes mellitus with peripheral neuropathy. Hemoglobin A1c 7.2. Lantus insulin 30 units each bedtime. Check CBGs a.c. and at bedtime.   -sugars acceptable 12. Hyperlipidemia. Zocor  13. MRSA PCR screening positive. Contact precautions      LOS (Days) 10 A FACE TO FACE EVALUATION WAS PERFORMED  Erick ColaceKIRSTEINS,ANDREW E 04/01/2014 10:13 AM

## 2014-04-01 NOTE — Progress Notes (Signed)
Occupational Therapy Weekly Progress Note  Patient Details  Name: MALIK RUFFINO MRN: [id number] Date of Birth: 1951-03-14  Beginning of progress report period: March 23, 2014 End of progress report period: April 01, 2014  Today's Date: 04/01/2014 Time: 4098-1191 Time Calculation (min): 46 min  Patient has met 0 of 5 short term goals.  Mrs. Cherubini is making steady gains with OT at this time.  Overall she can perform UB selfcare at a supervision level and needs min assist for LB bathing and mod assist for LB dressing.  Feel she could be more independent with dressing using AE however bilateral una boots limit her ability to effectively use the sockaide.  Pt overall needs min facilitation for sit to stand during LB selfcare and functional transfers.  Endurance continues to be limited as well as she needs multiple rest breaks after standing for limited periods of 2-3 mins at a time.  Feel she needs continued OT to reach current LTGS.  Feel they will need to be downgraded to supervision level for functional transfers and supervision to min assist for selfcare tasks based on progress.   Patient continues to demonstrate the following deficits: decreased balance, decreased endurance, decreased LE strength, and therefore will continue to benefit from skilled OT intervention to enhance overall performance with BADL.  Patient  progressing toward long term goals.  See goal revision..  Continue plan of care.  OT Short Term Goals Week 2:  OT Short Term Goal 1 (Week 2): Pt will perform LB bathing sit to stand with supervision. OT Short Term Goal 2 (Week 2): Pt will perform LB dressing sit to stand with supervision using AE. OT Short Term Goal 3 (Week 2): Pt will perform toilet transfers with supervision to elevated toilet or 3:1. OT Short Term Goal 4 (Week 2): Pt will perform 2 grooming tasks in standing with supervision. OT Short Term Goal 5 (Week 2): Pt will transfer from supine with HOB elevated with  min assist in preparation for selfcare tasks.   Skilled Therapeutic Interventions/Progress Updates:    Pt worked on sit to stand and standing balance in the gym.  Pt needing min assist for multiple sit to stand intervals while engaged in WII activity.  Pt continues to need min instructional cueing for hand placement with sit to stand as she tends to want to pull up on the [place] at times.  Once standing she is able to demonstrate static standing balance with min assist but needs mod demonstrational cueing to maintain upright posture with hip and lumbar extension.  Pt able to maintain standing for intervals of 1-2 mins before needing rest breaks.   Therapy Documentation Precautions:  Precautions Precautions: Fall Precaution Comments: Bilateral Una boots Restrictions Weight Bearing Restrictions: No  Pain: Pain Assessment Pain Assessment: No/denies pain Pain Score: 2  Pain Type: Acute pain Pain Location: Leg Pain Orientation: Right;Left;Upper Pain Descriptors / Indicators: Discomfort Pain Intervention(s): Medication (See eMAR) ADL: See FIM for current functional status  Therapy/Group: Individual Therapy  MCGUIRE,JAMES OTR/L 04/01/2014, 4:08 PM

## 2014-04-01 NOTE — Progress Notes (Signed)
Physical Therapy Weekly Progress Note  Patient Details  Name: TANYIKA BARROS MRN: [id number] Date of Birth: 08/11/1951  Beginning of progress report period: March 23, 2014 End of progress report period: April 01, 2014  Today's Date: 04/01/2014 Time: 0830-0900 Time Calculation (min): 30 min  Patient has met 1 of 5 short term goals.  Pt making slow but steady progress towards all STG's, however biggest limiting factor is overall weakness and body habitus during transitional movements (bed mobility and sit<>stand).  Once pt is in standing position, requires only min/guard for transfers and gait.  Have discussed pt having ramp built for increased safety upon D/C.  Have downgraded all mobility goals to S to min A for increased safety.  See goal revision for updates.   Patient continues to demonstrate the following deficits: decreased strength, decreased activity tolerance, decreased balance and therefore will continue to benefit from skilled PT intervention to enhance overall performance with activity tolerance, balance, postural control and ability to compensate for deficits.  Patient not progressing toward long term goals.  See goal revision..  Continue plan of care.  PT Short Term Goals Week 1:  PT Short Term Goal 1 (Week 1): Pt will perform bed mobility with min A.  PT Short Term Goal 1 - Progress (Week 1): Progressing toward goal PT Short Term Goal 2 (Week 1): Pt will perform bed <> w/c tranfers with min A.  PT Short Term Goal 2 - Progress (Week 1): Progressing toward goal PT Short Term Goal 3 (Week 1): Pt will ambulate 25 ft using LRAD with min A.  PT Short Term Goal 3 - Progress (Week 1): Met PT Short Term Goal 4 (Week 1): Pt will negotiate up/down 3 stairs using 2 rails with min A.  PT Short Term Goal 4 - Progress (Week 1): Progressing toward goal PT Short Term Goal 5 (Week 1): Pt will propel w/c 150 ft with supervision.  PT Short Term Goal 5 - Progress (Week 1): Progressing toward  goal Week 2:  PT Short Term Goal 1 (Week 2): =LTG's   Skilled Therapeutic Interventions/Progress Updates:   Pt received lying in bed this morning, c/o not getting grits for breakfast, but states that they are on their way.  Note that she kept perseverating on getting grits during session, distracting her at times from task.  Skilled session focused on bed mobility with HOB flat and without rails to better simulate home environment, functional transfers and standing tolerance.  Performed bed mobility with mod/max A.  Pt better able to bring LEs out of bed, however provided cues and facilitation for increased hip flex and trunk flex/rotation during task.  Performed standing from slightly elevated bed with min A today.  Ambulated to sink with min/guard and RW in order to brush teeth.  Pt wanting to sit, therefore allowed rest break with verbal encouragement to stand and brush teeth.  Performed at min/guard to close S level.  Left in w/c with all needs in reach.   Therapy Documentation Precautions:  Precautions Precautions: Fall Precaution Comments: Bilateral Una boots Restrictions Weight Bearing Restrictions: No   Pain: Pain Assessment Pain Score: 3  Pain Type: Acute pain Pain Location: Shoulder Pain Orientation: Left Pain Descriptors / Indicators: Aching Patients Stated Pain Goal: 3 Pain Intervention(s): Medication (See eMAR)  See FIM for current functional status  Therapy/Group: Individual Therapy  Denice Bors 04/01/2014, 10:41 AM

## 2014-04-01 NOTE — Plan of Care (Signed)
Problem: RH Stairs Goal: LTG Patient will ambulate up and down stairs w/assist (PT) LTG: Patient will ambulate up and down # of stairs with assistance (PT)  Outcome: Not Applicable Date Met:  54/36/06 Downgraded all mobility goals to S to min A due to decreased progress, esp during transitional movements and bed mobility.  Also D/C'd community ambulation goals, as she will likely not be appropriate for community amb at time of D/C.  Also D/C'd stair goal, as pt will have ramp installed for increased safety.

## 2014-04-01 NOTE — Progress Notes (Signed)
Interdry to folds.  Bilateral una boots in place. Patient reports she doesn't have to wear prevalon boots. Foam dressing to buttocks. Using air mattress overlay--SR's up X 4. Taking xanax PRN for anxiety. Pain managed with 1 vicodin PRN. Alfredo MartinezMurray, Jamie A

## 2014-04-01 NOTE — Progress Notes (Signed)
Occupational Therapy Session Note  Patient Details  Name: Robin [name] MRN: [id number] Date of Birth: 07/09/1951  Today's Date: 04/01/2014 Time: 0902-1002 Time Calculation (min): 60 min  Short Term Goals: Week 1:  OT Short Term Goal 1 (Week 1): Pt will perform LB bathing sit to stand with supervision. OT Short Term Goal 2 (Week 1): Pt will perform LB dressing sit to stand with supervision using AE. OT Short Term Goal 3 (Week 1): Pt will perform toilet transfers with supervision to elevated toilet or 3:1. OT Short Term Goal 4 (Week 1): Pt will perform 2 grooming tasks in standing with supervision. OT Short Term Goal 5 (Week 1): Pt will transfer from supine with HOB elevated with min assist in preparation for selfcare tasks.   Skilled Therapeutic Interventions/Progress Updates:    Pt performed bathing and dressing sit to stand at the sink.  She was able to perform sit to stand with min assist for removing his current pullover gown.  She was able to perform all UB selfcare with supervision from wheelchair.  She needed max assist to remove her gripper socks and to wash her feet as she could not reach them.  Pt with bilateral una boots so lower legs other than toes were not washed.  Pt also stated she did not need to wash her peri area as she was washed and cathed earlier this morning.  Pt integrated the reacher for donning underpants and pants with mod assist this session.   Therapy Documentation Precautions:  Precautions Precautions: Fall Precaution Comments: Bilateral Una boots Restrictions Weight Bearing Restrictions: No  Pain: Pain Assessment Pain Assessment: No/denies pain Pain Score: 3  Pain Type: Acute pain Pain Location: Shoulder Pain Orientation: Left Pain Descriptors / Indicators: Aching Patients Stated Pain Goal: 3 Pain Intervention(s): Medication (See eMAR) ADL: See FIM for current functional status  Therapy/Group: Individual Therapy  Tiberius Loftus  OTR/L 04/01/2014, 11:14 AM

## 2014-04-01 NOTE — Progress Notes (Signed)
ANTICOAGULATION CONSULT NOTE - Follow Up Consult  Pharmacy Consult for coumadin Indication: atrial fibrillation  No Known Allergies  Patient Measurements: Height: 5\' 6"  (167.6 cm) Weight: 248 lb (112.492 kg) IBW/kg (Calculated) : 59.3 Heparin Dosing Weight:   Vital Signs: Temp: 98.1 F (36.7 C) (06/29 0522) Temp src: Oral (06/29 0522) BP: 100/67 mmHg (06/29 0522) Pulse Rate: 78 (06/29 0522)  Labs:  Recent Labs  03/30/14 0438 03/31/14 0418 04/01/14 0500  LABPROT 30.9* 26.2* 23.4*  INR 2.97* 2.40* 2.08*  CREATININE  --  1.43*  --     Estimated Creatinine Clearance: 52.6 ml/min (by C-G formula based on Cr of 1.43).   Medications:  Scheduled:  . buPROPion  300 mg Oral Daily  . ciprofloxacin  500 mg Oral BID  . doxycycline  100 mg Oral Q12H  . furosemide  40 mg Oral Daily  . insulin aspart  0-9 Units Subcutaneous TID WC  . insulin glargine  30 Units Subcutaneous QHS  . nystatin   Topical BID  . simvastatin  20 mg Oral q1800  . sodium bicarbonate  650 mg Oral TID  . Warfarin - Pharmacist Dosing Inpatient   Does not apply q1800   Infusions:    Assessment: 63 yo female with afib is currently on therapeutic coumadin but INR down to 2.08 probably due to holding dose on 06/27. Goal of Therapy:  INR 2-3 Monitor platelets by anticoagulation protocol: Yes   Plan:  1) Coumadin 4 mg po x1 2) INR in am  So, Tsz-Yin 04/01/2014,8:23 AM

## 2014-04-01 NOTE — Progress Notes (Signed)
At 2000, no void after greater than 8hours without voiding, I & O cath=62750ml. At 0519, no void, I & O cath=37250ml. Alfredo MartinezMurray, Jamie A

## 2014-04-02 ENCOUNTER — Encounter (HOSPITAL_COMMUNITY): Payer: BC Managed Care – PPO | Admitting: Occupational Therapy

## 2014-04-02 ENCOUNTER — Inpatient Hospital Stay (HOSPITAL_COMMUNITY): Payer: BC Managed Care – PPO | Admitting: Rehabilitation

## 2014-04-02 ENCOUNTER — Inpatient Hospital Stay (HOSPITAL_COMMUNITY): Payer: BC Managed Care – PPO | Admitting: Occupational Therapy

## 2014-04-02 DIAGNOSIS — A419 Sepsis, unspecified organism: Secondary | ICD-10-CM

## 2014-04-02 DIAGNOSIS — I4891 Unspecified atrial fibrillation: Secondary | ICD-10-CM

## 2014-04-02 DIAGNOSIS — L97909 Non-pressure chronic ulcer of unspecified part of unspecified lower leg with unspecified severity: Secondary | ICD-10-CM

## 2014-04-02 DIAGNOSIS — R5381 Other malaise: Secondary | ICD-10-CM

## 2014-04-02 DIAGNOSIS — E669 Obesity, unspecified: Secondary | ICD-10-CM

## 2014-04-02 DIAGNOSIS — E1110 Type 2 diabetes mellitus with ketoacidosis without coma: Secondary | ICD-10-CM

## 2014-04-02 LAB — PROTIME-INR
INR: 2.37 — ABNORMAL HIGH (ref 0.00–1.49)
PROTHROMBIN TIME: 25.9 s — AB (ref 11.6–15.2)

## 2014-04-02 LAB — GLUCOSE, CAPILLARY
GLUCOSE-CAPILLARY: 86 mg/dL (ref 70–99)
GLUCOSE-CAPILLARY: 98 mg/dL (ref 70–99)
GLUCOSE-CAPILLARY: 124 mg/dL — AB (ref 70–99)
GLUCOSE-CAPILLARY: 188 mg/dL — AB (ref 70–99)
Glucose-Capillary: 89 mg/dL (ref 70–99)

## 2014-04-02 LAB — URINE MICROSCOPIC-ADD ON

## 2014-04-02 LAB — URINALYSIS, ROUTINE W REFLEX MICROSCOPIC
Bilirubin Urine: NEGATIVE
Glucose, UA: NEGATIVE mg/dL
KETONES UR: NEGATIVE mg/dL
Leukocytes, UA: NEGATIVE
Nitrite: NEGATIVE
PROTEIN: NEGATIVE mg/dL
Specific Gravity, Urine: 1.018 (ref 1.005–1.030)
UROBILINOGEN UA: 0.2 mg/dL (ref 0.0–1.0)
pH: 5 (ref 5.0–8.0)

## 2014-04-02 MED ORDER — WARFARIN SODIUM 4 MG PO TABS
4.0000 mg | ORAL_TABLET | Freq: Once | ORAL | Status: AC
Start: 2014-04-02 — End: 2014-04-02
  Administered 2014-04-02: 4 mg via ORAL
  Filled 2014-04-02: qty 1

## 2014-04-02 NOTE — Progress Notes (Signed)
Occupational Therapy Session Note  Patient Details  Name: Robin [name] MRN: [id number] Date of Birth: 11/03/1950  Today's Date: 04/02/2014 Time: 0901-1002 Time Calculation (min): 61 min  Short Term Goals: Week 2:  OT Short Term Goal 1 (Week 2): Pt will perform LB bathing sit to stand with supervision. OT Short Term Goal 2 (Week 2): Pt will perform LB dressing sit to stand with supervision using AE. OT Short Term Goal 3 (Week 2): Pt will perform toilet transfers with supervision to elevated toilet or 3:1. OT Short Term Goal 4 (Week 2): Pt will perform 2 grooming tasks in standing with supervision. OT Short Term Goal 5 (Week 2): Pt will transfer from supine with HOB elevated with min assist in preparation for selfcare tasks.   Skilled Therapeutic Interventions/Progress Updates:    Pt performed bathing and dressing during session.  She needed mod assist for sit to stand initially from the EOB to transfer to the wheelchair.  She performed all UB bathing and dressing with supervision and then she utilized the reacher for donning pants over her feet.  She needed min assist to pull the pants over her hips secondary to increased LOB posteriorly in standing.  Pt had performed peri bathing and donning underpants prior to session.  Pt finished session performing grooming tasks in sitting.  Therapy Documentation Precautions:  Precautions Precautions: Fall Precaution Comments: Bilateral Una boots Restrictions Weight Bearing Restrictions: No  Pain: Pain Assessment Pain Assessment: No/denies pain Pain Score: 0-No pain ADL: See FIM for current functional status  Therapy/Group: Individual Therapy  MCGUIRE,JAMES OTR/L 04/02/2014, 12:35 PM

## 2014-04-02 NOTE — Progress Notes (Signed)
Physical Therapy Session Note  Patient Details  Name: Robin BaizeJudith M [name] MRN: [id number] Date of Birth: 10/06/1950  Today's Date: 04/02/2014 Time: 4098-11911530-1603 Time Calculation (min): 33 min  Short Term Goals: Week 2:  PT Short Term Goal 1 (Week 2): =LTG's   Skilled Therapeutic Interventions/Progress Updates:   Pt received from OT in w/c in therapy gym.  Skilled session focused on bed mobility with use of leg lifter to determine if easier than performing sit>SL>supine.  Note getting into bed somewhat easier, however pt unable to get back into long sitting in order to get OOB, therefore had to perform rolling to get OOB.  Continues to require at least min A with leg lifter, but min/mod A when performing rolling.  Also worked on sit<>stand and stand pivot transfers with min to close S level.  Pt finally able to get into standing from slightly elevated mat at S level with continued max verbal cues for forward trunk lean and weight shift.  Assisted pt back to room and left in w/c with all needs in reach.   Therapy Documentation Precautions:  Precautions Precautions: Fall Precaution Comments: Bilateral Una boots Restrictions Weight Bearing Restrictions: No   Pain: Pain Assessment Pain Score: 3  Pain Type: Acute pain Pain Location: Leg Pain Orientation: Right;Left Pain Descriptors / Indicators: Aching;Discomfort;Heaviness Pain Intervention(s): Medication (See eMAR)   Locomotion : Ambulation Ambulation/Gait Assistance: 4: Min guard   See FIM for current functional status  Therapy/Group: Individual Therapy  Vista Deckarcell, Emily Ann 04/02/2014, 4:21 PM

## 2014-04-02 NOTE — Progress Notes (Signed)
[place] PHYSICAL MEDICINE & REHABILITATION     PROGRESS NOTE    Subjective/Complaints:    Review of Systems - Negative except as above Objective: Vital Signs: Blood pressure 100/65, pulse 89, temperature 98.3 F (36.8 C), temperature source Oral, resp. rate 18, height 5\' 6"  (1.676 m), weight 112.492 kg (248 lb), SpO2 95.00%. No results found. No results found for this basename: WBC, HGB, HCT, PLT,  in the last 72 hours  Recent Labs  03/31/14 0418  NA 135*  K 3.5*  CL 95*  GLUCOSE 90  BUN 42*  CREATININE 1.43*  CALCIUM 8.1*   CBG (last 3)   Recent Labs  04/01/14 2012 04/02/14 0415 04/02/14 0744  GLUCAP 172* 86 98    Wt Readings from Last 3 Encounters:  03/27/14 112.492 kg (248 lb)  03/20/14 111.5 kg (245 lb 13 oz)  10/25/11 134.265 kg (296 lb)   BMET    Component Value Date/Time   NA 135* 03/31/2014 0418   K 3.5* 03/31/2014 0418   CL 95* 03/31/2014 0418   CO2 27 03/31/2014 0418   GLUCOSE 90 03/31/2014 0418   BUN 42* 03/31/2014 0418   CREATININE 1.43* 03/31/2014 0418   CALCIUM 8.1* 03/31/2014 0418   GFRNONAA 39* 03/31/2014 0418   GFRAA 45* 03/31/2014 0418    Physical Exam:  Constitutional: She is oriented to person, place, and time. obese  HENT: oral mucosa pink and moist  Head: Normocephalic.  Eyes: EOM are normal.  Neck: Normal range of motion. Neck supple. No thyromegaly present.  Cardiovascular:  Cardiac rate controlled.Irreg irreg  No murmur  Respiratory: Effort normal and breath sounds normal. No respiratory distress.  GI: Soft. Bowel sounds are normal. She exhibits no distension.  Musculoskeletal:  1+ bilateral LE , bilateral unna dressings in place Neurological: She is alert and oriented to person, place, and time.  Follows full commands. Alert and appropriate. No sensory deficits except for the feet which remain dimnished to light touch.Skin:  Bilateral lower extremities with Unna boots. Scattered ecchymoses in UE's. A few abrasions areas over  toes are visible beyond unna's. Psychiatric: She is a little anxious but generally appropriate and pleasant Neck has decreased range of motion with lateral bending toward the left and rotation to the left. Mild tenderness to palpation along the cervical paraspinal muscles and lateral masses on left side. Upper extremity strength 4/5 bilateral deltoid, bicep, tricep, grip extremity strength 3/5 bilateral hip flexors 4/5 knee extensor ankle dorsi flexion plantar flexor Assessment/Plan: 1. Functional deficits secondary to deconditioning after cellulitis/sepsis which require 3+ hours per day of interdisciplinary therapy in a comprehensive inpatient rehab setting. Having problems with retention- poss diabetic cystopathy with immobility Toileting program , check PVR, check UA FIM: FIM - Bathing Bathing Steps Patient Completed: Chest;Right Arm;Left Arm;Abdomen;Right upper leg;Left upper leg Bathing: 3: Mod-Patient completes 5-7 [id number] 10 parts or 50-74%  FIM - Upper Body Dressing/Undressing Upper body dressing/undressing steps patient completed: Thread/unthread right sleeve of front closure shirt/dress;Thread/unthread left sleeve of front closure shirt/dress;Pull shirt around back of front closure shirt/dress;Button/unbutton shirt Upper body dressing/undressing: 5: Supervision: Safety issues/verbal cues FIM - Lower Body Dressing/Undressing Lower body dressing/undressing steps patient completed: Pull pants up/down;Pull underwear up/down Lower body dressing/undressing: 2: Max-Patient completed 25-49% of tasks  FIM - Toileting Toileting: 1: Total-Patient completed zero steps, helper did all 3  FIM - Diplomatic Services operational officerToilet Transfers Toilet Transfers Assistive Devices: Building control surveyorBedside commode;Walker Toilet Transfers: 4-To toilet/BSC: Min A (steadying Pt. > 75%);4-From toilet/BSC: Min A (steadying Pt. >  75%)  FIM - Bed/Chair Transfer Bed/Chair Transfer Assistive Devices: Therapist, occupationalWalker Bed/Chair Transfer: 3: Chair or W/C > Bed: Mod A  (lift or lower assist);3: Bed > Chair or W/C: Mod A (lift or lower assist)  FIM - Locomotion: Wheelchair Distance: 80 Locomotion: Wheelchair: 0: Activity did not occur FIM - Locomotion: Ambulation Locomotion: Ambulation Assistive Devices: Designer, industrial/productWalker - Rolling Ambulation/Gait Assistance: 4: Min assist Locomotion: Ambulation: 1: Travels less than 50 ft with minimal assistance (Pt.>75%)  Comprehension Comprehension Mode: Auditory Comprehension: 7-Follows complex conversation/direction: With no assist  Expression Expression Mode: Verbal Expression: 6-Expresses complex ideas: With extra time/assistive device  Social Interaction Social Interaction: 6-Interacts appropriately with others with medication or extra time (anti-anxiety, antidepressant).  Problem Solving Problem Solving: 6-Solves complex problems: With extra time  Memory Memory: 6-More than reasonable amt of time Medical Problem List and Plan:  1. Functional deficits secondary to Deconditioning after multi-medical iisssues  2. DVT Prophylaxis/Anticoagulation: Chronic Coumadin therapy for atrial fibrillation. Monitor for any bleeding episodes  3. Pain Management: Hydrocodone as needed. Monitor with increased mobility  4. Mood/depression: Wellbutrin 300 mg daily, xanax to 0.5mg  tid prn.  5. Neuropsych: This patient is capable of making decisions on his own behalf.  6. Atrial fibrillation. Continue Coumadin therapy as directed. Cardiac rate control. No chest pain or shortness of breath at present 7. Hypertension. Lasix 40 mg daily,Stopped Lopressor 12.5 mg twice a day due to Low BP. Monitor with increased mobility  8. AKI/hyponatremia/hyperkalemia. Followup per renal services. Labs are normalizing.   Creatinine baseline 1.32 --labs improving  -monitor 9. Acute on chronic anemia. Latest hemoglobin 8.6.  10. Chronic leg ulcers. Empiric doxycycline/Cipro. [facility]  -may be able to dc unna's soon- will need comp  hose vs ACE wraps  11. Diabetes mellitus with peripheral neuropathy. Hemoglobin A1c 7.2. Lantus insulin 30 units each bedtime. Check CBGs a.c. and at bedtime.   -sugars acceptable 12. Hyperlipidemia. Zocor  13. MRSA PCR screening positive. Contact precautions      LOS (Days) 11 A FACE TO FACE EVALUATION WAS PERFORMED  Erick ColaceKIRSTEINS,ANDREW E 04/02/2014 8:46 AM

## 2014-04-02 NOTE — Progress Notes (Signed)
ANTICOAGULATION CONSULT NOTE - Follow Up Consult  Pharmacy Consult for coumadin Indication: atrial fibrillation  No Known Allergies  Patient Measurements: Height: 5\' 6"  (167.6 cm) Weight: 248 lb (112.492 kg) IBW/kg (Calculated) : 59.3 Heparin Dosing Weight:   Vital Signs: Temp: 98.3 F (36.8 C) (06/30 0500) Temp src: Oral (06/30 0500) BP: 100/65 mmHg (06/30 0500) Pulse Rate: 89 (06/30 0500)  Labs:  Recent Labs  03/31/14 0418 04/01/14 0500 04/02/14 0550  LABPROT 26.2* 23.4* 25.9*  INR 2.40* 2.08* 2.37*  CREATININE 1.43*  --   --     Estimated Creatinine Clearance: 52.6 ml/min (by C-G formula based on Cr of 1.43).   Medications:  Scheduled:  . ALPRAZolam  0.5 mg Oral TID  . buPROPion  300 mg Oral Daily  . furosemide  40 mg Oral Daily  . insulin aspart  0-9 Units Subcutaneous TID WC  . insulin glargine  30 Units Subcutaneous QHS  . nystatin   Topical BID  . simvastatin  20 mg Oral q1800  . sodium bicarbonate  650 mg Oral TID  . Warfarin - Pharmacist Dosing Inpatient   Does not apply q1800   Infusions:    Assessment: 63 yo female with afib is currently on therapeutic coumadin.  INR today is 2.37. Patient is off cipro and doxy.  Goal of Therapy:  INR 2-3 Monitor platelets by anticoagulation protocol: Yes   Plan:  1) Coumadin 4mg  po x1 2) INR in am  So, Tsz-Yin 04/02/2014,8:20 AM

## 2014-04-02 NOTE — Progress Notes (Signed)
Occupational Therapy Session Note  Patient Details  Name: Robin BaizeJudith M Dohrman MRN: [id number] Date of Birth: 05/11/1951  Today's Date: 04/02/2014 Time: [id number] Time Calculation (min): 44 min  Skilled Therapeutic Interventions/Progress Updates:    Took pt down to the ADL apartment to work on bed transfers and bed mobility secondary to pt exhibiting difficulty with this in the am sessions.  Pt needed min assist to transfer to the bed from the wheelchair using the RW.  Note she takes very small steps and demonstrates a flexed posture in standing.  Once on the EOB introduced leg lifter as she has difficulty lifting her LEs into the bed.  She still needed min assist to bring them in.  Also discussed method of using a folded sheet to possibly lift both LEs together.  She was able to transfer from supine to sit EOB with supervision.  She needed mod assist to stand from the bed to return to her chair.    Therapy Documentation Precautions:  Precautions Precautions: Fall Precaution Comments: Bilateral Una boots Restrictions Weight Bearing Restrictions: No  Pain: Pain Assessment Pain Assessment: No/denies pain Pain Score: 3  Pain Type: Acute pain Pain Location: Leg Pain Orientation: Right;Left Pain Descriptors / Indicators: Aching;Discomfort;Heaviness Pain Intervention(s): Medication (See eMAR) ADL: See FIM for current functional status  Therapy/Group: Individual Therapy  Benjamyn Hestand OTR/L 04/02/2014, 4:25 PM

## 2014-04-02 NOTE — Progress Notes (Signed)
NUTRITION FOLLOW-UP  DOCUMENTATION CODES Per approved criteria  -Morbid Obesity   INTERVENTION: Glucerna Shake po daily PRN, each supplement provides 220 kcal and 10 grams of protein RD to follow for nutrition care plan  NUTRITION DIAGNOSIS: Increased nutrient needs related to wound healing as evidenced by estimated nutrition needs. Ongoing.  Goal: Pt to meet >/= 90% of their estimated nutrition needs - improving.  Monitor:  PO & supplemental intake, weight, labs, I/O's  ASSESSMENT: 63 y.o. Female with history of HTN, atrial fibrillation with Coumadin therapy, peripheral vascular disease with chronic leg wounds. Patient independent PTA living with her husband and using a straight point cane. Presented 03/16/2014 with generalized weakness and worsening of leg ulcers. Placed on vancomycin and Zosyn for chronic leg ulcers and transition to oral antibiotics. Patient was admitted for comprehensive rehabilitation program.  Continues with adequate intake, PO intake mostly 100% per flowsheet records. Ordered for CHO Modified diet with 800 ml fluid restriction. Per patient, she continues to eat well and is consuming meals as scheduled. We reviewed high protein sources in her diet and the importance of nutrition for wound healing.  Per chart, plan for d/c on 7/4.  Height: Ht Readings from Last 1 Encounters:  03/22/14 5\' 6"  (1.676 m)    Weight: Wt Readings from Last 1 Encounters:  03/27/14 248 lb (112.492 kg)  Admit 264 lb  BMI:  Body mass index is 40.05 kg/(m^2). Obese Class III  Estimated Nutritional Needs: Kcal: 1700-1900 Protein: 90-100 gm Fluid: 1.7-1.9 L  Skin: Stage I sacrum Stage III bilateral sacrum Stage II L buttocks   Diet Order: Carb Control   Intake/Output Summary (Last 24 hours) at 04/02/14 1211 Last data filed at 04/02/14 0900  Gross per 24 hour  Intake    600 ml  Output    625 ml  Net    -25 ml    Last BM: 6/30  Labs:   Recent Labs Lab  03/31/14 0418  NA 135*  K 3.5*  CL 95*  CO2 27  BUN 42*  CREATININE 1.43*  CALCIUM 8.1*  GLUCOSE 90    CBG (last 3)   Recent Labs  04/01/14 2012 04/02/14 0415 04/02/14 0744  GLUCAP 172* 86 98    Scheduled Meds: . ALPRAZolam  0.5 mg Oral TID  . buPROPion  300 mg Oral Daily  . furosemide  40 mg Oral Daily  . insulin aspart  0-9 Units Subcutaneous TID WC  . insulin glargine  30 Units Subcutaneous QHS  . nystatin   Topical BID  . simvastatin  20 mg Oral q1800  . sodium bicarbonate  650 mg Oral TID  . warfarin  4 mg Oral ONCE-1800  . Warfarin - Pharmacist Dosing Inpatient   Does not apply q1800    Continuous Infusions:    Jarold MottoSamantha Worley MS, RD, LDN Inpatient Registered Dietitian Pager: [phone] After-hours pager: [phone]

## 2014-04-02 NOTE — Progress Notes (Addendum)
Physical Therapy Session Note  Patient Details  Name: Robin BaizeJudith M Aiello MRN: [id number] Date of Birth: 09/15/1951  Today's Date: 04/02/2014 Time: 1103-1211 Time Calculation (min): 68 min  Short Term Goals: Week 2:  PT Short Term Goal 1 (Week 2): =LTG's   Skilled Therapeutic Interventions/Progress Updates:   Pt received sitting in w/c in room, agreeable to therapy.  Requested to make phone call to mother in order to have her get pts dirty clothing, as she was informed that she could not use washing machine due to contact precautions.  Assisted pt to/from therapy gym in w/c at total A level.  Skilled session focused on sit<>stands, stair training, bed mobility, gait and discussion of assist at home.  Performed 3, 4" steps with B handrails x 2 reps with mod A.  Discussed how to have pt assist and where to place RW, hands for increased safety.  Ambulated x 30' with RW at min/guard level.  Continues to require intermittent cues for upright posture and increased stride length.  Also continue to verbalize and demonstrate increased forward trunk lean for more adequate standing.  Pt verbalized understanding, however still requires up to min A for standing.  Performed bed mobility at ADL apt in order to better simulate home.  Requires mod A for sit<>supine in order to assist with managing BLEs.  Ended session with lengthy discussion with pt and mother about who is going to assist pt during the day when husband is at work.  Discussed that w/c will not fit into restroom, therefore will need bedside commode, however will still need someone to assist her.  Also discussed hiring home health aide, however they also do not come every day.  Also spoke with CSW about these issues to perhaps look into pt hiring outside help during the day.  Pt verbalized understanding.  Assisted back to room and left in w/c with all needs in reach.   Therapy Documentation Precautions:  Precautions Precautions: Fall Precaution  Comments: Bilateral Una boots Restrictions Weight Bearing Restrictions: No   Pain: Pain Assessment Pain Assessment: No/denies pain Pain Score: 0-No pain   Locomotion : Ambulation Ambulation/Gait Assistance: 4: Min guard   See FIM for current functional status  Therapy/Group: Individual Therapy  Vista Deckarcell, Emily Ann 04/02/2014, 12:51 PM

## 2014-04-03 ENCOUNTER — Inpatient Hospital Stay (HOSPITAL_COMMUNITY): Payer: BC Managed Care – PPO | Admitting: Rehabilitation

## 2014-04-03 ENCOUNTER — Encounter (HOSPITAL_COMMUNITY): Payer: BC Managed Care – PPO | Admitting: Occupational Therapy

## 2014-04-03 ENCOUNTER — Inpatient Hospital Stay (HOSPITAL_COMMUNITY): Payer: BC Managed Care – PPO | Admitting: Occupational Therapy

## 2014-04-03 LAB — GLUCOSE, CAPILLARY
Glucose-Capillary: 89 mg/dL (ref 70–99)
Glucose-Capillary: 118 mg/dL — ABNORMAL HIGH (ref 70–99)
Glucose-Capillary: 91 mg/dL (ref 70–99)
Glucose-Capillary: 156 mg/dL — ABNORMAL HIGH (ref 70–99)

## 2014-04-03 LAB — PROTIME-INR
INR: 2.92 — ABNORMAL HIGH (ref 0.00–1.49)
Prothrombin Time: 30.5 seconds — ABNORMAL HIGH (ref 11.6–15.2)

## 2014-04-03 LAB — URINE CULTURE

## 2014-04-03 MED ORDER — WARFARIN SODIUM 2 MG PO TABS
2.0000 mg | ORAL_TABLET | Freq: Once | ORAL | Status: AC
  Administered 2014-04-03: 2 mg via ORAL
  Filled 2014-04-03: qty 1

## 2014-04-03 NOTE — Progress Notes (Signed)
ANTICOAGULATION CONSULT NOTE - Follow Up Consult  Pharmacy Consult for coumadin Indication: atrial fibrillation  No Known Allergies  Patient Measurements: Height: 5\' 6"  (167.6 cm) Weight: 248 lb (112.492 kg) IBW/kg (Calculated) : 59.3 Heparin Dosing Weight:   Vital Signs: Temp: 98.2 F (36.8 C) (07/01 0545) Temp src: Oral (07/01 0545) BP: 94/61 mmHg (07/01 0545) Pulse Rate: 77 (07/01 0545)  Labs:  Recent Labs  04/01/14 0500 04/02/14 0550 04/03/14 0625  LABPROT 23.4* 25.9* 30.5*  INR 2.08* 2.37* 2.92*    Estimated Creatinine Clearance: 52.6 ml/min (by C-G formula based on Cr of 1.43).   Medications:  Scheduled:  . ALPRAZolam  0.5 mg Oral TID  . buPROPion  300 mg Oral Daily  . furosemide  40 mg Oral Daily  . insulin aspart  0-9 Units Subcutaneous TID WC  . insulin glargine  30 Units Subcutaneous QHS  . nystatin   Topical BID  . simvastatin  20 mg Oral q1800  . sodium bicarbonate  650 mg Oral TID  . Warfarin - Pharmacist Dosing Inpatient   Does not apply q1800   Infusions:    Assessment: 63 yo female with afib is currently on therapeutic coumadin, but INR up to 2.92 from 2.37. Goal of Therapy:  INR 2-3 Monitor platelets by anticoagulation protocol: Yes   Plan:  1) Coumadin 2mg  po x1 tonigte 2) INR in am  Robin [name], Tsz-Yin 04/03/2014,8:25 AM

## 2014-04-03 NOTE — Progress Notes (Signed)
Physical Therapy Session Note  Patient Details  Name: Robin BaizeJudith M Dillion MRN: [id number] Date of Birth: 03/27/1951  Today's Date: 04/03/2014 Time: 1430-1520 Time Calculation (min): 50 min  Short Term Goals: Week 2:  PT Short Term Goal 1 (Week 2): =LTG's   Skilled Therapeutic Interventions/Progress Updates:   Pt received sitting in w/c in room, finishing discussion with CSW regarding having husband come in on Friday for family training.  Assisted pt to/from therapy gym via w/c at total A.  Skilled session focused on stair negotiation for home entry, bed mobility for quality and improved technique, and also car transfer to prepare for D/C on Saturday.  Performed 3, 4" steps forwards with B handrails at mod A level today.  Continues to rely heavily on UEs when performing stairs with cues for upright posture and stepping sequence.  Pt states "I felt I did better yesterday with this."  Expressed that it could be due to fatigue from earlier therapies.  Then worked on gait and bed mobility in ADL apt to better simulate home, despite probable need for hospital bed.  Requires mod A for sit>SL>supine and for scooting hips over in bed via bridging for LEs, however did note pt did better with supine>SL>sit and requires only heavy min A for task.  Sit<>stand from bed requires up to mod A for task.  Ended session with car transfer.  Requires S for actual transfer, however min A to stand from w/c and mod/max A to stand from car.  Will have pt continue to practice for safe D/C. Assisted pt back to room and left in w/c with all needs in reach.    Therapy Documentation Precautions:  Precautions Precautions: Fall Precaution Comments: Bilateral Una boots Restrictions Weight Bearing Restrictions: No   Vital Signs: Therapy Vitals Temp: 97.9 F (36.6 C) Temp src: Oral Pulse Rate: 73 Resp: 19 BP: 124/74 mmHg Patient Position (if appropriate): Sitting Oxygen Therapy SpO2: 100 % O2 Device: None (Room  air) Pain: pt with no c/o pain during session.         See FIM for current functional status  Therapy/Group: Individual Therapy  Vista Deckarcell, Keagon Glascoe Ann 04/03/2014, 6:33 PM

## 2014-04-03 NOTE — Progress Notes (Signed)
Occupational Therapy Session Note  Patient Details  Name: Robin [name] MRN: [id number] Date of Birth: 03/18/1951  Today's Date: 04/03/2014 Time: 4098-11910930-1032 Time Calculation (min): 62 min  Short Term Goals: Week 2:  OT Short Term Goal 1 (Week 2): Pt will perform LB bathing sit to stand with supervision. OT Short Term Goal 2 (Week 2): Pt will perform LB dressing sit to stand with supervision using AE. OT Short Term Goal 3 (Week 2): Pt will perform toilet transfers with supervision to elevated toilet or 3:1. OT Short Term Goal 4 (Week 2): Pt will perform 2 grooming tasks in standing with supervision. OT Short Term Goal 5 (Week 2): Pt will transfer from supine with HOB elevated with min assist in preparation for selfcare tasks.   Skilled Therapeutic Interventions/Progress Updates:    Pt performed toileting and toilet transfer to begin session.  She needed mod assist for initial transfer to the toilet and then back to the EOB.  Pt needed bed elevated in order to perform sit to stand as she could not stand up initially.  Pt transferred to the wheelchair and performed UB bathing with supervision.  She was then able to donn UB clothing with supervision.  Did not attempt LB bathing as nursing was going to come in and assist with washing peri area and applying powder.  Pt not comfortable with female therapist assisting with this.    Therapy Documentation Precautions:  Precautions Precautions: Fall Precaution Comments: Bilateral Una boots Restrictions Weight Bearing Restrictions: No  Pain: Pain Assessment Pain Assessment: Faces Faces Pain Scale: Hurts a little bit Pain Type: Acute pain Pain Location: Leg Pain Orientation: Right;Left Pain Intervention(s): Medication (See eMAR);RN made aware ADL: See FIM for current functional status  Therapy/Group: Individual Therapy  Elberta Lachapelle OTR/L 04/03/2014, 11:34 AM

## 2014-04-03 NOTE — Progress Notes (Signed)
Social Work Patient ID: Robin [name], female   DOB: 01/02/1951, 62 y.o.   MRN: [id number] Met with pt and spoke with husband via telephone to discuss team conference progression toward goals and need for family education with husband. Aware pt is requiring 24 hr care due to difficulty in sit to stand.  Have given them a private duty list to hire this assistance.  Have scheduled family education for Friday At 1;00-3;00 with husband and will see how this goes.  Discussing DME and what she needs, continue to work on discharge plans.

## 2014-04-03 NOTE — Progress Notes (Signed)
Occupational Therapy Session Note  Patient Details  Name: Robin [name] MRN: [id number] Date of Birth: 12/03/1950  Today's Date: 04/03/2014 Time: [id number] Time Calculation (min): 41 min  Short Term Goals: Week 2:  OT Short Term Goal 1 (Week 2): Pt will perform LB bathing sit to stand with supervision. OT Short Term Goal 2 (Week 2): Pt will perform LB dressing sit to stand with supervision using AE. OT Short Term Goal 3 (Week 2): Pt will perform toilet transfers with supervision to elevated toilet or 3:1. OT Short Term Goal 4 (Week 2): Pt will perform 2 grooming tasks in standing with supervision. OT Short Term Goal 5 (Week 2): Pt will transfer from supine with HOB elevated with min assist in preparation for selfcare tasks.   Skilled Therapeutic Interventions/Progress Updates:  Patient resting in w/c upon arrival.  [facility] with focus on activity tolerance, sit><stand, dynamic balance, scooting forward and backward while seated, forward weight shifts with sit><stand for a more effective and efficient transitional movement.  Patient required mod cues and occasional mod assist to scoot hips forward and back so as not to shear her bottom and back of her thighs.  Patient unable to perform hygiene following BM and required assist to pull up pants.  Therapy Documentation Precautions:  Precautions Precautions: Fall Precaution Comments: Bilateral Una boots Restrictions Weight Bearing Restrictions: No Pain: Denies pain See FIM for current functional status  Therapy/Group: Individual Therapy  Neizan Debruhl 04/03/2014, 2:31 PM

## 2014-04-03 NOTE — Progress Notes (Signed)
[place] PHYSICAL MEDICINE & REHABILITATION     PROGRESS NOTE    Subjective/Complaints: Working with PT FR discussed pt states it is more strictly enforced the last day or 2   Review of Systems - Negative except as above Objective: Vital Signs: Blood pressure 94/61, pulse 77, temperature 98.2 F (36.8 C), temperature source Oral, resp. rate 18, height 5\' 6"  (1.676 m), weight 112.492 kg (248 lb), SpO2 94.00%. No results found. No results found for this basename: WBC, HGB, HCT, PLT,  in the last 72 hours No results found for this basename: NA, K, CL, CO, GLUCOSE, BUN, CREATININE, CALCIUM,  in the last 72 hours CBG (last 3)   Recent Labs  04/02/14 1613 04/02/14 2130 04/03/14 0737  GLUCAP 124* 188* 89    Wt Readings from Last 3 Encounters:  03/27/14 112.492 kg (248 lb)  03/20/14 111.5 kg (245 lb 13 oz)  10/25/11 134.265 kg (296 lb)   BMET    Component Value Date/Time   NA 135* 03/31/2014 0418   K 3.5* 03/31/2014 0418   CL 95* 03/31/2014 0418   CO2 27 03/31/2014 0418   GLUCOSE 90 03/31/2014 0418   BUN 42* 03/31/2014 0418   CREATININE 1.43* 03/31/2014 0418   CALCIUM 8.1* 03/31/2014 0418   GFRNONAA 39* 03/31/2014 0418   GFRAA 45* 03/31/2014 0418    Physical Exam:  Constitutional: She is oriented to person, place, and time. obese  HENT: oral mucosa pink and moist  Head: Normocephalic.  Eyes: EOM are normal.  Neck: Normal range of motion. Neck supple. No thyromegaly present.  Cardiovascular:  Cardiac rate controlled.Irreg irreg  No murmur  Respiratory: Effort normal and breath sounds normal. No respiratory distress.  GI: Soft. Bowel sounds are normal. She exhibits no distension.  Musculoskeletal:  1+ bilateral LE , bilateral unna dressings in place Neurological: She is alert and oriented to person, place, and time.  Follows full commands. Alert and appropriate. No sensory deficits except for the feet which remain dimnished to light touch.Skin:  Bilateral lower  extremities with Unna boots. Scattered ecchymoses in UE's. A few abrasions areas over toes are visible beyond unna's. Psychiatric: She is a little anxious but generally appropriate and pleasant Neck has decreased range of motion with lateral bending toward the left and rotation to the left. Mild tenderness to palpation along the cervical paraspinal muscles and lateral masses on left side. Upper extremity strength 4/5 bilateral deltoid, bicep, tricep, grip extremity strength 3/5 bilateral hip flexors 4/5 knee extensor ankle dorsi flexion plantar flexor Assessment/Plan: 1. Functional deficits secondary to deconditioning after cellulitis/sepsis which require 3+ hours per day of interdisciplinary therapy in a comprehensive inpatient rehab setting.  FIM: FIM - Bathing Bathing Steps Patient Completed: Chest;Right Arm;Left Arm;Abdomen;Right upper leg;Left upper leg Bathing: 3: Mod-Patient completes 5-7 7065f 10 parts or 50-74%  FIM - Upper Body Dressing/Undressing Upper body dressing/undressing steps patient completed: Thread/unthread right sleeve of pullover shirt/dresss;Thread/unthread left sleeve of pullover shirt/dress;Put head through opening of pull over shirt/dress;Pull shirt over trunk Upper body dressing/undressing: 5: Supervision: Safety issues/verbal cues FIM - Lower Body Dressing/Undressing Lower body dressing/undressing steps patient completed: Pull pants up/down;Pull underwear up/down Lower body dressing/undressing: 2: Max-Patient completed 25-49% of tasks  FIM - Toileting Toileting: 1: Total-Patient completed zero steps, helper did all 3  FIM - Diplomatic Services operational officerToilet Transfers Toilet Transfers Assistive Devices: Building control surveyorBedside commode;Walker Toilet Transfers: 4-To toilet/BSC: Min A (steadying Pt. > 75%);4-From toilet/BSC: Min A (steadying Pt. > 75%)  FIM - Press photographerBed/Chair Transfer Bed/Chair Transfer  Assistive Devices: Therapist, occupationalWalker Bed/Chair Transfer: 3: Chair or W/C > Bed: Mod A (lift or lower assist);3: Bed > Chair  or W/C: Mod A (lift or lower assist);3: Sit > Supine: Mod A (lifting assist/Pt. 50-74%/lift 2 legs)  FIM - Locomotion: Wheelchair Distance: 80 Locomotion: Wheelchair: 0: Activity did not occur FIM - Locomotion: Ambulation Locomotion: Ambulation Assistive Devices: Designer, industrial/productWalker - Rolling Ambulation/Gait Assistance: 4: Min guard Locomotion: Ambulation: 1: Travels less than 50 ft with minimal assistance (Pt.>75%)  Comprehension Comprehension Mode: Auditory Comprehension: 7-Follows complex conversation/direction: With no assist  Expression Expression Mode: Verbal Expression: 6-Expresses complex ideas: With extra time/assistive device  Social Interaction Social Interaction: 6-Interacts appropriately with others with medication or extra time (anti-anxiety, antidepressant).  Problem Solving Problem Solving: 6-Solves complex problems: With extra time  Memory Memory: 6-More than reasonable amt of time Medical Problem List and Plan:  1. Functional deficits secondary to Deconditioning after multi-medical iisssues  2. DVT Prophylaxis/Anticoagulation: Chronic Coumadin therapy for atrial fibrillation. Monitor for any bleeding episodes  3. Pain Management: Hydrocodone as needed. Monitor with increased mobility  4. Mood/depression: Wellbutrin 300 mg daily, xanax to 0.5mg  tid prn.  5. Neuropsych: This patient is capable of making decisions on his own behalf.  6. Atrial fibrillation. Continue Coumadin therapy as directed. Cardiac rate control. No chest pain or shortness of breath at present 7. Hypertension. Lasix 40 mg daily,Stopped Lopressor 12.5 mg twice a day due to Low BP. Monitor with increased mobility  8. AKI/hyponatremia/hyperkalemia. Followup per renal services. Labs are normalizing.   Creatinine baseline 1.32 --labs improving  -monitor, improving will loosen FR slightly 9. Acute on chronic anemia. Latest hemoglobin 8.6.  10. Chronic leg ulcers. Empiric doxycycline/Cipro. [facility]  -may be able to dc unna's soon- will need comp hose vs ACE wraps  11. Diabetes mellitus with peripheral neuropathy. Hemoglobin A1c 7.2. Lantus insulin 30 units each bedtime. Check CBGs a.c. and at bedtime.   -sugars acceptable 12. Hyperlipidemia. Zocor  13. MRSA PCR screening positive. Contact precautions      LOS (Days) 12 A FACE TO FACE EVALUATION WAS PERFORMED  Claudette LawsKIRSTEINS,Escarlet Saathoff E 04/03/2014 9:44 AM

## 2014-04-03 NOTE — Progress Notes (Signed)
Physical Therapy Session Note  Patient Details  Name: Robin BaizeJudith M Gravatt MRN: [id number] Date of Birth: 11/27/1950  Today's Date: 04/03/2014 Time: [id number] Time Calculation (min): 62 min  Short Term Goals: Week 2:  PT Short Term Goal 1 (Week 2): =LTG's   Skilled Therapeutic Interventions/Progress Updates:   Pt received sitting in w/c in room, looking for ipad.  Assisted pt with locating ipad and then pt requesting to brush teeth.  PT agreeable, however gave condition of having pt stand whole time to brush teeth.  Pt continues to require mod A to stand from w/c (lower surface) and S once up to RW and at sink while she brushed teeth x 4 mins.  MD in room during session to assess while pt also wanted to discuss fluid intake restrictions.  MD agreed to increased amount.  Remainder of session focused on w/c mobility for increased strength and activity tolerance x 80' at S level.  Also worked on functional sit<>stand from mat at varying levels and gait training with RW.  Performed 4 sit<>stand with min/mod A, however was unable to perform from lower surface on last rep without max A due to increased fatigue and lower surface.  Ended with gait x 15' and another 5833' x 1 with RW at close S level.  Continue to provide cues for upright posture and increased stride length.  Pt assisted back to room and left in w/c with all needs in reach.   Therapy Documentation Precautions:  Precautions Precautions: Fall Precaution Comments: Bilateral Una boots Restrictions Weight Bearing Restrictions: No   Pain: Pain Assessment Pain Assessment: Faces Faces Pain Scale: Hurts a little bit Pain Type: Acute pain Pain Location: Leg Pain Orientation: Right;Left Pain Intervention(s): Medication (See eMAR);RN made aware Locomotion : Ambulation Ambulation/Gait Assistance: 5: Supervision Wheelchair Mobility Distance: 80   See FIM for current functional status  Therapy/Group: Individual Therapy  Vista Deckarcell, Watt Geiler  Ann 04/03/2014, 11:42 AM

## 2014-04-03 NOTE — Patient Care Conference (Signed)
Inpatient RehabilitationTeam Conference and Plan of Care Update Date: 04/03/2014   Time: 11;40 AM    Patient Name: Robin [name]      Medical Record Number: [id number]  Date of Birth: 04/23/1951 Sex: Female         Room/Bed: 4W06C/4W06C-01 Payor Info: Payor: [employer]UE [employer]UE [employer] / Plan: [employer] PPO / Product Type: *No Product type* /    Admitting Diagnosis: Deconditioned BLE cellutitis  MRSA  Admit Date/Time:  03/22/2014  6:27 PM Admission Comments: No comment available   Primary Diagnosis:  <principal problem not specified> Principal Problem: <principal problem not specified>  Patient Active Problem List   Diagnosis Date Noted  . Physical deconditioning 03/22/2014  . Ulcers of both lower legs, chronic venous stasis 03/21/2014  . Hyperkalemia 03/16/2014  . Hyponatremia 03/16/2014  . DKA (diabetic ketoacidoses) 03/16/2014  . Sepsis 03/16/2014  . Atrial fibrillation 08/07/2013  . Varicose veins of lower extremities with ulcer 10/25/2011  . Varicose veins of lower extremities with other complications 10/18/2011  . Venous insufficiency 07/12/2011    Expected Discharge Date: Expected Discharge Date: 04/06/14  Team Members Present: Physician leading conference: Dr. Claudette LawsAndrew Kirsteins Social Worker Present: Dossie DerBecky Ty Buntrock, LCSW Nurse Present: Darnelle BosNastassia Cave, RN PT Present: Harriet ButteEmily Parcell, PT OT Present: Perrin MalteseJames McGuire, OT SLP Present: Fae PippinMelissa Bowie, SLP PPS Coordinator present : Edson SnowballBecky Windsor, Chapman FitchPT;Marie Noel, RN, CRRN     Current Status/Progress Goal Weekly Team Focus  Medical   sit to stand very difficult  adaptive equipment  D/C planning   Bowel/Bladder   [facility], continent of bowel and bladder. LBM 6/30  To remain continent   Timed toileting   Swallow/Nutrition/ Hydration     wfl        ADL's   supervision for UB selfcare, min to mod assist for LB selfcare,  min assist for functional transfers with use of the RW  downgraded to supervision/min assist level   functional transfers, bed mobility, standing balance functional transfers, building endurance   Mobility   Currently requires mod A for bed mobility, min A for sit<>stand, once standing requires close S for short distance ambulation in controlled and carpeted environment.  Still limited with overall weakness and limited endurance.  Will likely need to hire somewhat at D/C to assist when husband is working.   downgraded goals to min A overall and S for ambulation.   transfers, bed mobility, gait, balance, strengthening, pt/family education/training.    Communication     wfl        Safety/Cognition/ Behavioral Observations  Pt is alert and oriented to safety plan and precautions  Pt to remain alert and oriented to safety plan and precautions  Reorient pt to safety plan and precautions as needed    Pain   C/O pain x1, Norco 1 tab given  Pain less than or equal to 3  Asses pain Q shift and PRN   Skin   Celluitis of ABD and bilat LE, Groin excorreated, Stage 2 on buttocks, foam dressing in place  No new skin breakdown  Asses skin qshift and as needed for breakdown       *See Care Plan and progress notes for long and short-term goals.  Barriers to Discharge: still needs assist for Sit/Stand    Possible Resolutions to Barriers:  equipment    Discharge Planning/Teaching Needs:  Home with husband who plans to come in Friday for education.  Providing options regarding possibility of hiring some assistance at  home while husband works      Team Discussion:  Sit/stand difficult for pt and will need assistance with this-so will need 24 hr care at home. Need family education and will arrange.  Pt doing well, just not as quickly as thought-endurance issues  Revisions to Treatment Plan:  Downgraded goals to supervision/min level   Continued Need for Acute Rehabilitation Level of Care: The patient requires daily medical management by a physician with specialized training in physical medicine and  rehabilitation for the following conditions: Daily direction of a multidisciplinary physical rehabilitation program to ensure safe treatment while eliciting the highest outcome that is of practical value to the patient.: Yes Daily medical management of patient stability for increased activity during participation in an intensive rehabilitation regime.: Yes Daily analysis of laboratory values and/or radiology reports with any subsequent need for medication adjustment of medical intervention for : Post surgical problems;Neurological problems  Lucy ChrisDupree, Kamilah Correia G 04/03/2014, 2:11 PM

## 2014-04-03 NOTE — Plan of Care (Signed)
Problem: RH Wheelchair Mobility Goal: LTG Patient will propel w/c in community environment (PT) LTG: Patient will propel wheelchair in community environment, # of feet with assist (PT)  Outcome: Not Applicable Date Met:  04/03/14 Pt will not have enough endurance at time of D/C to perform community w/c mobility, therefore will D/C goal.      

## 2014-04-04 ENCOUNTER — Inpatient Hospital Stay (HOSPITAL_COMMUNITY): Payer: BC Managed Care – PPO | Admitting: Occupational Therapy

## 2014-04-04 ENCOUNTER — Encounter (HOSPITAL_COMMUNITY): Payer: BC Managed Care – PPO | Admitting: Occupational Therapy

## 2014-04-04 ENCOUNTER — Inpatient Hospital Stay (HOSPITAL_COMMUNITY): Payer: BC Managed Care – PPO | Admitting: Rehabilitation

## 2014-04-04 DIAGNOSIS — E1110 Type 2 diabetes mellitus with ketoacidosis without coma: Secondary | ICD-10-CM

## 2014-04-04 DIAGNOSIS — A419 Sepsis, unspecified organism: Secondary | ICD-10-CM

## 2014-04-04 DIAGNOSIS — I4891 Unspecified atrial fibrillation: Secondary | ICD-10-CM

## 2014-04-04 DIAGNOSIS — E669 Obesity, unspecified: Secondary | ICD-10-CM

## 2014-04-04 DIAGNOSIS — L97909 Non-pressure chronic ulcer of unspecified part of unspecified lower leg with unspecified severity: Secondary | ICD-10-CM

## 2014-04-04 DIAGNOSIS — R5381 Other malaise: Secondary | ICD-10-CM

## 2014-04-04 LAB — GLUCOSE, CAPILLARY
GLUCOSE-CAPILLARY: 85 mg/dL (ref 70–99)
GLUCOSE-CAPILLARY: 122 mg/dL — AB (ref 70–99)
Glucose-Capillary: 92 mg/dL (ref 70–99)
Glucose-Capillary: 138 mg/dL — ABNORMAL HIGH (ref 70–99)

## 2014-04-04 LAB — PROTIME-INR
INR: 3.26 — AB (ref 0.00–1.49)
Prothrombin Time: 33.2 seconds — ABNORMAL HIGH (ref 11.6–15.2)

## 2014-04-04 MED ORDER — WARFARIN SODIUM 2 MG PO TABS
2.0000 mg | ORAL_TABLET | Freq: Once | ORAL | Status: AC
  Administered 2014-04-04: 2 mg via ORAL
  Filled 2014-04-04: qty 1

## 2014-04-04 MED ORDER — BETHANECHOL CHLORIDE 25 MG PO TABS
25.0000 mg | ORAL_TABLET | Freq: Every day | ORAL | Status: DC
  Administered 2014-04-04 – 2014-04-05 (×2): 25 mg via ORAL
  Filled 2014-04-04 (×3): qty 1

## 2014-04-04 NOTE — Progress Notes (Signed)
ANTICOAGULATION CONSULT NOTE - Follow Up Consult  Pharmacy Consult for coumadin Indication: atrial fibrillation  No Known Allergies  Patient Measurements: Height: 5\' 6"  (167.6 cm) Weight: 248 lb (112.492 kg) IBW/kg (Calculated) : 59.3 Heparin Dosing Weight:   Vital Signs: Temp: 97.9 F (36.6 C) (07/01 2105) Temp src: Oral (07/01 2105) BP: 107/64 mmHg (07/02 0605) Pulse Rate: 81 (07/02 0605)  Labs:  Recent Labs  04/02/14 0550 04/03/14 0625 04/04/14 0430  LABPROT 25.9* 30.5* 33.2*  INR 2.37* 2.92* 3.26*    Estimated Creatinine Clearance: 52.6 ml/min (by C-G formula based on Cr of 1.43).   Medications:  Scheduled:  . ALPRAZolam  0.5 mg Oral TID  . buPROPion  300 mg Oral Daily  . furosemide  40 mg Oral Daily  . insulin aspart  0-9 Units Subcutaneous TID WC  . insulin glargine  30 Units Subcutaneous QHS  . nystatin   Topical BID  . simvastatin  20 mg Oral q1800  . sodium bicarbonate  650 mg Oral TID  . Warfarin - Pharmacist Dosing Inpatient   Does not apply q1800   Infusions:    Assessment: 63 yo female with afib is currently on supratherapeutic coumadin.  INR today is 2.92 from 3.26.   Goal of Therapy:  INR 2-3 Monitor platelets by anticoagulation protocol: Yes   Plan:  1) Coumadin 2mg  po x1 2) INR in am  Dawnetta Copenhaver, Tsz-Yin 04/04/2014,8:26 AM

## 2014-04-04 NOTE — Progress Notes (Signed)
Occupational Therapy Session Note  Patient Details  Name: Robin BaizeJudith M [name] MRN: [id number] Date of Birth: 12/14/1950  Today's Date: 04/04/2014 Time: 1300-1330 Time Calculation (min): 30 min  Skilled Therapeutic Interventions/Progress Updates:    Pt worked on standing balance during session and simulated clothing management for toileting tasks.  She was able to stand with min assist from the wheelchair and initially alternate pulling down her shorts with one hand at a time. She was able to progress to using both UEs to pull them up without needing to stabilize one on the walker.  Worked on standing endurance to finish session with pt maintaining standing at sink while therapist assisted her with shampooing and rinsing her hair.  She was able to maintain standing for 5 mins without needing a rest break and only supervision to maintain.   Therapy Documentation Precautions:  Precautions Precautions: Fall Precaution Comments: Bilateral Una boots Restrictions Weight Bearing Restrictions: No  Pain: Pain Assessment Pain Assessment: No/denies pain Pain Score: 5  Pain Type: Acute pain Pain Location: Sacrum Pain Descriptors / Indicators: Aching Pain Intervention(s): Medication (See eMAR) ADL: See FIM for current functional status  Therapy/Group: Individual Therapy  Jequan Shahin OTR/L 04/04/2014, 3:23 PM

## 2014-04-04 NOTE — Progress Notes (Signed)
[place] PHYSICAL MEDICINE & REHABILITATION     PROGRESS NOTE    Subjective/Complaints: Only requiring cath at noc, PVR~37300ml last noc, voids well during the day   Review of Systems - Negative except as above Objective: Vital Signs: Blood pressure 107/64, pulse 81, temperature 97.9 F (36.6 C), temperature source Oral, resp. rate 18, height 5\' 6"  (1.676 m), weight 112.492 kg (248 lb), SpO2 94.00%. No results found. No results found for this basename: WBC, HGB, HCT, PLT,  in the last 72 hours No results found for this basename: NA, K, CL, CO, GLUCOSE, BUN, CREATININE, CALCIUM,  in the last 72 hours CBG (last 3)   Recent Labs  04/03/14 1703 04/03/14 2113 04/04/14 0742  GLUCAP 91 156* 92    Wt Readings from Last 3 Encounters:  03/27/14 112.492 kg (248 lb)  03/20/14 111.5 kg (245 lb 13 oz)  10/25/11 134.265 kg (296 lb)   BMET    Component Value Date/Time   NA 135* 03/31/2014 0418   K 3.5* 03/31/2014 0418   CL 95* 03/31/2014 0418   CO2 27 03/31/2014 0418   GLUCOSE 90 03/31/2014 0418   BUN 42* 03/31/2014 0418   CREATININE 1.43* 03/31/2014 0418   CALCIUM 8.1* 03/31/2014 0418   GFRNONAA 39* 03/31/2014 0418   GFRAA 45* 03/31/2014 0418    Physical Exam:  Constitutional: She is oriented to person, place, and time. obese  HENT: oral mucosa pink and moist  Head: Normocephalic.  Eyes: EOM are normal.  Neck: Normal range of motion. Neck supple. No thyromegaly present.  Cardiovascular:  Cardiac rate controlled.Irreg irreg  No murmur  Respiratory: Effort normal and breath sounds normal. No respiratory distress.  GI: Soft. Bowel sounds are normal. She exhibits no distension.  Musculoskeletal:  1+ bilateral LE , bilateral unna dressings in place Neurological: She is alert and oriented to person, place, and time.  Follows full commands. Alert and appropriate. No sensory deficits except for the feet which remain dimnished to light touch.Skin:  Bilateral lower extremities with Unna  boots. Scattered ecchymoses in UE's. A few abrasions areas over toes are visible beyond unna's. Psychiatric: She is a little anxious but generally appropriate and pleasant Neck has decreased range of motion with lateral bending toward the left and rotation to the left. Mild tenderness to palpation along the cervical paraspinal muscles and lateral masses on left side. Upper extremity strength 4/5 bilateral deltoid, bicep, tricep, grip extremity strength 3/5 bilateral hip flexors 4/5 knee extensor ankle dorsi flexion plantar flexor Assessment/Plan: 1. Functional deficits secondary to deconditioning after cellulitis/sepsis which require 3+ hours per day of interdisciplinary therapy in a comprehensive inpatient rehab setting.  FIM: FIM - Bathing Bathing Steps Patient Completed: Chest;Right Arm;Left Arm;Abdomen;Right upper leg;Left upper leg Bathing: 3: Mod-Patient completes 5-7 5047f 10 parts or 50-74%  FIM - Upper Body Dressing/Undressing Upper body dressing/undressing steps patient completed: Thread/unthread right sleeve of pullover shirt/dresss;Thread/unthread left sleeve of pullover shirt/dress;Put head through opening of pull over shirt/dress;Pull shirt over trunk Upper body dressing/undressing: 5: Supervision: Safety issues/verbal cues FIM - Lower Body Dressing/Undressing Lower body dressing/undressing steps patient completed: Pull pants up/down;Pull underwear up/down Lower body dressing/undressing: 2: Max-Patient completed 25-49% of tasks  FIM - Toileting Toileting steps completed by patient: Adjust clothing prior to toileting Toileting: 2: Max-Patient completed 1 of 3 steps  FIM - Diplomatic Services operational officerToilet Transfers Toilet Transfers Assistive Devices: Building control surveyorBedside commode;Walker Toilet Transfers: 3-To toilet/BSC: Mod A (lift or lower assist);4-From toilet/BSC: Min A (steadying Pt. > 75%)  FIM -  Bed/Chair Transport plannerTransfer Bed/Chair Transfer Assistive Devices: Therapist, occupationalWalker Bed/Chair Transfer: 3: Chair or W/C > Bed: Mod A  (lift or lower assist);3: Bed > Chair or W/C: Mod A (lift or lower assist);3: Sit > Supine: Mod A (lifting assist/Pt. 50-74%/lift 2 legs)  FIM - Locomotion: Wheelchair Distance: 80 Locomotion: Wheelchair: 2: Travels 50 - 149 ft with supervision, cueing or coaxing FIM - Locomotion: Ambulation Locomotion: Ambulation Assistive Devices: Designer, industrial/productWalker - Rolling Ambulation/Gait Assistance: 5: Supervision Locomotion: Ambulation: 1: Travels less than 50 ft with supervision/safety issues  Comprehension Comprehension Mode: Auditory Comprehension: 7-Follows complex conversation/direction: With no assist  Expression Expression Mode: Verbal Expression: 6-Expresses complex ideas: With extra time/assistive device  Social Interaction Social Interaction: 6-Interacts appropriately with others with medication or extra time (anti-anxiety, antidepressant).  Problem Solving Problem Solving: 6-Solves complex problems: With extra time  Memory Memory: 6-More than reasonable amt of time Medical Problem List and Plan:  1. Functional deficits secondary to Deconditioning after multi-medical iisssues  2. DVT Prophylaxis/Anticoagulation: Chronic Coumadin therapy for atrial fibrillation. Monitor for any bleeding episodes  3. Pain Management: Hydrocodone as needed. Monitor with increased mobility  4. Mood/depression: Wellbutrin 300 mg daily, xanax to 0.5mg  tid prn.  5. Neuropsych: This patient is capable of making decisions on his own behalf.  6. Atrial fibrillation. Continue Coumadin therapy as directed. Cardiac rate control. No chest pain or shortness of breath at present 7. Hypertension. Lasix 40 mg daily,Stopped Lopressor 12.5 mg twice a day due to Low BP. Monitor with increased mobility  8. AKI/hyponatremia/hyperkalemia. Followup per renal services. Labs are normalizing.   Creatinine baseline 1.32 --labs improving  -monitor, improving will loosen FR slightly 9. Acute on chronic anemia. Latest hemoglobin 8.6.  10.  Chronic leg ulcers. Empiric doxycycline/Cipro. [facility]  -may be able to dc unna's soon- will need comp hose vs ACE wraps  11. Diabetes mellitus with peripheral neuropathy. Hemoglobin A1c 7.2. Lantus insulin 30 units each bedtime. Check CBGs a.c. and at bedtime.   -sugars acceptable 12. Hyperlipidemia. Zocor  13. MRSA PCR screening positive. Contact precautions  14.  Diabetic cystopathy overall improving will cont toileting, add qhs urecholine    LOS (Days) 13 A FACE TO FACE EVALUATION WAS PERFORMED  Claudette LawsKIRSTEINS,ANDREW E 04/04/2014 10:32 AM

## 2014-04-04 NOTE — Progress Notes (Addendum)
Physical Therapy Session Note  Patient Details  Name: Robin BaizeJudith M Conteh MRN: [id number] Date of Birth: 01/05/1951  Today's Date: 04/04/2014 Time: 0830-0928 Time Calculation (min): 58 min  Short Term Goals: Week 2:  PT Short Term Goal 1 (Week 2): =LTG's   Skilled Therapeutic Interventions/Progress Updates:   Pt received lying in bed, agreeable to therapy.  Performed bed mobility with air in bed, and with use of bed rail and bed elevated since she will have hospital bed at home, therefore able to perform at min A level today.  Attempted to stand with air in bed, however was unsafe, as PT worried about forward translation, therefore let air out of bed for safety and then could elevate bed for pt to stand at S level and transfer to w/c at S level with cues for increased step lengths and upright posture.  Performed sit<>stand with min A from w/c in order to don shorts/underwear prior to session.  Assisted pt to/from therapy gym via w/c at total A level in order for skilled session focused on stair negotiation, gait training and car transfer.  Performed 3, 4" steps with B handrail with mod A (light mod) with continued cues for sequencing.  Added another w/c cushion under current cushion for height and ease of standing.  Pt able to stand with S from w/c at this height.  Ambulated x 5933' with RW at S level with cues for upright posture and increased stride length.  Ended session with car transfer at mod A level.  Note much easier to assist LEs today and increased knee flex due to decreased swelling in LEs.  Discussed sitting in recliner at home vs w/c to elevate LEs to prevent increased swelling.  Pt in agreement.  Assisted pt back to room and transferred to recliner with w/c cushion to add height.  Left with legs elevated and all needs in reach.   Therapy Documentation Precautions:  Precautions Precautions: Fall Precaution Comments: Bilateral Una boots Restrictions Weight Bearing Restrictions: No    Pain: Pain Assessment Pain Assessment: No/denies pain   Locomotion : Ambulation Ambulation/Gait Assistance: 5: Supervision   See FIM for current functional status  Therapy/Group: Individual Therapy  Vista Deckarcell, Aleshia Cartelli Ann 04/04/2014, 12:27 PM

## 2014-04-04 NOTE — Progress Notes (Signed)
Occupational Therapy Session Note  Patient Details  Name: Robin [name] MRN: [id number] Date of Birth: 01/25/1951  Today's Date: 04/04/2014 Time: 1005-1105 Time Calculation (min): 60 min  Short Term Goals: Week 2:  OT Short Term Goal 1 (Week 2): Pt will perform LB bathing sit to stand with supervision. OT Short Term Goal 2 (Week 2): Pt will perform LB dressing sit to stand with supervision using AE. OT Short Term Goal 3 (Week 2): Pt will perform toilet transfers with supervision to elevated toilet or 3:1. OT Short Term Goal 4 (Week 2): Pt will perform 2 grooming tasks in standing with supervision. OT Short Term Goal 5 (Week 2): Pt will transfer from supine with HOB elevated with min assist in preparation for selfcare tasks.   Skilled Therapeutic Interventions/Progress Updates:    Pt worked on [employer] bathing and dressing during session.  She had finished toileting and donning underpants and shorts prior to OT session.  She was able to complete UB bathing and dressing with increased time and supervision.  She also performed grooming in sitting from the wheelchair level.  Discussed DME needs and use for home based on pt's setup.  Also re-emphasized the need for pt to have 24 hour supervision.   Therapy Documentation Precautions:  Precautions Precautions: Fall Precaution Comments: Bilateral Una boots Restrictions Weight Bearing Restrictions: No  Pain: Pain Assessment Pain Assessment: No/denies pain ADL: See FIM for current functional status  Therapy/Group: Individual Therapy  Voula Waln OTR/L 04/04/2014, 12:14 PM

## 2014-04-04 NOTE — Progress Notes (Signed)
Physical Therapy Session Note  Patient Details  Name: Robin BaizeJudith M Francis MRN: [id number] Date of Birth: 02/14/1951  Today's Date: 04/04/2014 Time: [id number] Time Calculation (min): 47 min  Short Term Goals: Week 2:  PT Short Term Goal 1 (Week 2): =LTG's   Skilled Therapeutic Interventions/Progress Updates:   Pt received sitting in w/c in room, agreeable to therapy session.  Skilled session focused on gait training with RW for increased distance and improved quality and car transfers for safe D/C on Saturday.  Performed gait x 35' x 1 and another 25' x 1 with RW (provided with wider RW for better positioning inside of RW) at S level.  Requires min A to get into standing, however have noted marked improvement over last few days with task.  Continue to provide cues for upright posture, increased stride length and decreased UE WB to increase WB through LEs.  Ended session with car transfer at mod A level (requires assist for BLEs into car).  She continues to have increased difficulty standing from car due to low surface, however has also improved with this as well.  Pt assisted back to room and transferred to recliner in order to elevate LEs.  All needs in reach.   Therapy Documentation Precautions:  Precautions Precautions: Fall Precaution Comments: Bilateral Una boots Restrictions Weight Bearing Restrictions: No   Vital Signs: Therapy Vitals Temp: 97.4 F (36.3 C) Temp src: Oral Pulse Rate: 69 Resp: 18 BP: 106/67 mmHg Patient Position (if appropriate): Sitting Oxygen Therapy SpO2: 98 % O2 Device: None (Room air) Pain: Pain Assessment Pain Assessment: No/denies pain Pain Score: 5  Pain Type: Acute pain Pain Location: Sacrum Pain Descriptors / Indicators: Aching Pain Intervention(s): Medication (See eMAR)   Locomotion : Ambulation Ambulation/Gait Assistance: 5: Supervision   See FIM for current functional status  Therapy/Group: Individual Therapy  Vista Deckarcell, Olivea Sonnen  Ann 04/04/2014, 4:13 PM

## 2014-04-05 ENCOUNTER — Inpatient Hospital Stay (HOSPITAL_COMMUNITY): Payer: BC Managed Care – PPO | Admitting: Rehabilitation

## 2014-04-05 ENCOUNTER — Inpatient Hospital Stay (HOSPITAL_COMMUNITY): Payer: BC Managed Care – PPO | Admitting: *Deleted

## 2014-04-05 ENCOUNTER — Ambulatory Visit (HOSPITAL_COMMUNITY): Payer: BC Managed Care – PPO | Admitting: Rehabilitation

## 2014-04-05 LAB — GLUCOSE, CAPILLARY
GLUCOSE-CAPILLARY: 142 mg/dL — AB (ref 70–99)
Glucose-Capillary: 158 mg/dL — ABNORMAL HIGH (ref 70–99)
Glucose-Capillary: 109 mg/dL — ABNORMAL HIGH (ref 70–99)
Glucose-Capillary: 181 mg/dL — ABNORMAL HIGH (ref 70–99)

## 2014-04-05 LAB — PROTIME-INR
INR: 3.33 — ABNORMAL HIGH (ref 0.00–1.49)
Prothrombin Time: 33.8 seconds — ABNORMAL HIGH (ref 11.6–15.2)

## 2014-04-05 MED ORDER — FUROSEMIDE 40 MG PO TABS: 40.0000 mg | ORAL_TABLET | Freq: Every day | ORAL | Status: AC

## 2014-04-05 MED ORDER — WARFARIN SODIUM 2 MG PO TABS: 2.0000 mg | ORAL_TABLET | Freq: Every day | ORAL | Status: DC

## 2014-04-05 MED ORDER — ALPRAZOLAM 0.5 MG PO TABS: 0.5000 mg | ORAL_TABLET | Freq: Three times a day (TID) | ORAL | Status: AC

## 2014-04-05 MED ORDER — NYSTATIN 100000 UNIT/GM EX POWD: 1.0000 | Freq: Two times a day (BID) | CUTANEOUS | Status: DC

## 2014-04-05 MED ORDER — INSULIN GLARGINE 100 UNIT/ML ~~LOC~~ SOLN: 30.0000 [IU] | Freq: Every day | SUBCUTANEOUS | Status: AC

## 2014-04-05 MED ORDER — HYDROCODONE-ACETAMINOPHEN 5-325 MG PO TABS: 1.0000 | ORAL_TABLET | ORAL | Status: AC | PRN

## 2014-04-05 MED ORDER — BUPROPION HCL ER (XL) 300 MG PO TB24: 300.0000 mg | ORAL_TABLET | Freq: Every day | ORAL | Status: AC

## 2014-04-05 MED ORDER — BETHANECHOL CHLORIDE 25 MG PO TABS: 25.0000 mg | ORAL_TABLET | Freq: Every day | ORAL | Status: AC

## 2014-04-05 MED ORDER — SIMVASTATIN 20 MG PO TABS: 20.0000 mg | ORAL_TABLET | Freq: Every day | ORAL | Status: AC

## 2014-04-05 MED ORDER — WARFARIN SODIUM 2 MG PO TABS
2.0000 mg | ORAL_TABLET | Freq: Every day | ORAL | Status: DC
  Administered 2014-04-05: 2 mg via ORAL
  Filled 2014-04-05 (×2): qty 1

## 2014-04-05 MED ORDER — SODIUM BICARBONATE 650 MG PO TABS: 650.0000 mg | ORAL_TABLET | Freq: Three times a day (TID) | ORAL | Status: AC

## 2014-04-05 NOTE — Progress Notes (Signed)
Robin [name] PHYSICAL MEDICINE & REHABILITATION     PROGRESS NOTE    Subjective/Complaints: No cath last noc Started qhs urecholine No pain Bowels ok   Review of Systems - Negative except as above Objective: Vital Signs: Blood pressure 100/55, pulse 70, temperature 97.7 F (36.5 C), temperature source Oral, resp. rate 18, height 5\' 6"  (1.676 m), weight 112.492 kg (248 lb), SpO2 98.00%. No results found. No results found for this basename: WBC, HGB, HCT, PLT,  in the last 72 hours No results found for this basename: NA, K, CL, CO, GLUCOSE, BUN, CREATININE, CALCIUM,  in the last 72 hours CBG (last 3)   Recent Labs  04/04/14 1708 04/04/14 2111 04/05/14 0746  GLUCAP 122* 138* 142*    Wt Readings from Last 3 Encounters:  03/27/14 112.492 kg (248 lb)  03/20/14 111.5 kg (245 lb 13 oz)  10/25/11 134.265 kg (296 lb)   BMET    Component Value Date/Time   NA 135* 03/31/2014 0418   K 3.5* 03/31/2014 0418   CL 95* 03/31/2014 0418   CO2 27 03/31/2014 0418   GLUCOSE 90 03/31/2014 0418   BUN 42* 03/31/2014 0418   CREATININE 1.43* 03/31/2014 0418   CALCIUM 8.1* 03/31/2014 0418   GFRNONAA 39* 03/31/2014 0418   GFRAA 45* 03/31/2014 0418    Physical Exam:  Constitutional: She is oriented to person, place, and time. obese  HENT: oral mucosa pink and moist  Head: Normocephalic.  Eyes: EOM are normal.  Neck: Normal range of motion. Neck supple. No thyromegaly present.  Cardiovascular:  Cardiac rate controlled.Irreg irreg  No murmur  Respiratory: Effort normal and breath sounds normal. No respiratory distress.  GI: Soft. Bowel sounds are normal. She exhibits no distension.  Musculoskeletal:  1+ bilateral LE , bilateral unna dressings in place Neurological: She is alert and oriented to person, place, and time.  Follows full commands. Alert and appropriate. No sensory deficits except for the feet which remain dimnished to light touch.Skin:  Bilateral lower extremities with Unna boots.  Scattered ecchymoses in UE's. A few abrasions areas over toes are visible beyond unna's. Psychiatric: She is a little anxious but generally appropriate and pleasant Neck has decreased range of motion with lateral bending toward the left and rotation to the left. Mild tenderness to palpation along the cervical paraspinal muscles and lateral masses on left side. Upper extremity strength 4/5 bilateral deltoid, bicep, tricep, grip extremity strength 3/5 bilateral hip flexors 4/5 knee extensor ankle dorsi flexion plantar flexor Assessment/Plan: 1. Functional deficits secondary to deconditioning after cellulitis/sepsis which require 3+ hours per day of interdisciplinary therapy in a comprehensive inpatient rehab setting. Plan D/C in am after MD rounds FIM: FIM - Bathing Bathing Steps Patient Completed: Chest;Right Arm;Left Arm;Abdomen;Right upper leg;Left upper leg Bathing: 3: Mod-Patient completes 5-7 [id number] 10 parts or 50-74%  FIM - Upper Body Dressing/Undressing Upper body dressing/undressing steps patient completed: Thread/unthread right sleeve of pullover shirt/dresss;Thread/unthread left sleeve of pullover shirt/dress;Put head through opening of pull over shirt/dress;Pull shirt over trunk;Thread/unthread right sleeve of front closure shirt/dress;Thread/unthread left sleeve of front closure shirt/dress;Pull shirt around back of front closure shirt/dress;Button/unbutton shirt Upper body dressing/undressing: 5: Supervision: Safety issues/verbal cues FIM - Lower Body Dressing/Undressing Lower body dressing/undressing steps patient completed: Pull pants up/down;Pull underwear up/down Lower body dressing/undressing: 2: Max-Patient completed 25-49% of tasks  FIM - Toileting Toileting steps completed by patient: Adjust clothing prior to toileting Toileting: 2: Max-Patient completed 1 of 3 steps  FIM - Best boyToilet Transfers Toilet Transfers  Assistive Devices: Bedside commode;Walker Toilet Transfers: 3-To  toilet/BSC: Mod A (lift or lower assist);4-From toilet/BSC: Min A (steadying Pt. > 75%)  FIM - Bed/Chair Transfer Bed/Chair Transfer Assistive Devices: Arm rests;HOB elevated (will have hospital bed at home) Bed/Chair Transfer: 5: Supine > Sit: Supervision (verbal cues/safety issues);4: Bed > Chair or W/C: Min A (steadying Pt. > 75%)  FIM - Locomotion: Wheelchair Distance: 100 Locomotion: Wheelchair: 2: Travels 50 - 149 ft with supervision, cueing or coaxing FIM - Locomotion: Ambulation Locomotion: Ambulation Assistive Devices: Designer, industrial/productWalker - Rolling Ambulation/Gait Assistance: 5: Supervision Locomotion: Ambulation: 1: Travels less than 50 ft with supervision/safety issues  Comprehension Comprehension Mode: Auditory Comprehension: 7-Follows complex conversation/direction: With no assist  Expression Expression Mode: Verbal Expression: 6-Expresses complex ideas: With extra time/assistive device  Social Interaction Social Interaction: 6-Interacts appropriately with others with medication or extra time (anti-anxiety, antidepressant).  Problem Solving Problem Solving: 6-Solves complex problems: With extra time  Memory Memory: 6-More than reasonable amt of time Medical Problem List and Plan:  1. Functional deficits secondary to Deconditioning after multi-medical iisssues  2. DVT Prophylaxis/Anticoagulation: Chronic Coumadin therapy for atrial fibrillation. Monitor for any bleeding episodes  3. Pain Management: Hydrocodone as needed. Monitor with increased mobility  4. Mood/depression: Wellbutrin 300 mg daily, xanax to 0.5mg  tid prn.  5. Neuropsych: This patient is capable of making decisions on his own behalf.  6. Atrial fibrillation. Continue Coumadin therapy as directed. Cardiac rate control. No chest pain or shortness of breath at present 7. Hypertension. Lasix 40 mg daily,Stopped Lopressor 12.5 mg twice a day due to Low BP. Monitor with increased mobility  8.  AKI/hyponatremia/hyperkalemia. Followup per renal services. Labs are normalizing.   Creatinine baseline 1.32 --labs improving  -monitor, improving will loosen FR slightly 9. Acute on chronic anemia. Latest hemoglobin 8.6.  10. Chronic leg ulcers. Empiric doxycycline/Cipro. [facility]  -may be able to dc unna's soon- will need comp hose vs ACE wraps  11. Diabetes mellitus with peripheral neuropathy. Hemoglobin A1c 7.2. Lantus insulin 30 units each bedtime. Check CBGs a.c. and at bedtime.   -sugars acceptable 12. Hyperlipidemia. Zocor  13. MRSA PCR screening positive. Contact precautions  14.  Diabetic cystopathy overall improving will cont toileting, add qhs urecholine    LOS (Days) 14 A FACE TO FACE EVALUATION WAS PERFORMED  Claudette LawsKIRSTEINS,ANDREW E 04/05/2014 10:39 AM

## 2014-04-05 NOTE — Progress Notes (Addendum)
ANTICOAGULATION CONSULT NOTE - Follow Up Consult  Pharmacy Consult for coumadin Indication: atrial fibrillation  No Known Allergies  Patient Measurements: Height: 5\' 6"  (167.6 cm) Weight: 248 lb (112.492 kg) IBW/kg (Calculated) : 59.3  Vital Signs: Temp: 97.7 F (36.5 C) (07/03 0500) Temp src: Oral (07/03 0500) BP: 100/55 mmHg (07/03 0500) Pulse Rate: 70 (07/03 0500)  Labs:  Recent Labs  04/03/14 0625 04/04/14 0430 04/05/14 0530  LABPROT 30.5* 33.2* 33.8*  INR 2.92* 3.26* 3.33*    Estimated Creatinine Clearance: 52.6 ml/min (by C-G formula based on Cr of 1.43).   Medications:  Scheduled:  . ALPRAZolam  0.5 mg Oral TID  . bethanechol  25 mg Oral QHS  . buPROPion  300 mg Oral Daily  . furosemide  40 mg Oral Daily  . insulin aspart  0-9 Units Subcutaneous TID WC  . insulin glargine  30 Units Subcutaneous QHS  . nystatin   Topical BID  . simvastatin  20 mg Oral q1800  . sodium bicarbonate  650 mg Oral TID  . warfarin  2 mg Oral q1800  . Warfarin - Pharmacist Dosing Inpatient   Does not apply q1800   Infusions:    Assessment: Robin [name] resumed on warfarin from PTA with a slightly SUPRAtherapeutic INR today (INR 3.33 << 3.26, goal of 2-3), No CBC since 6/22 - will order for 7/4 AM. No overt s/sx of bleeding noted.   Noted discharge summary written with discharge dose of 2 mg daily (dose also entered for this evening). Likely okay - will need a repeat PT/INR check by Monday, 7/6   Goal of Therapy:  INR 2-3 Monitor platelets by anticoagulation protocol: Yes   Plan:  1. Warfarin 2 mg x 1 dose at 1800 today (if still here, dose entered by PA) 2. Will continue to monitor for any signs/symptoms of bleeding and will follow up with PT/INR in the a.m. (if still here)  Georgina PillionElizabeth Manvi Guilliams, PharmD, BCPS Clinical Pharmacist Pager: [phone] 04/05/2014 12:48 PM

## 2014-04-05 NOTE — Progress Notes (Signed)
Occupational Therapy Session Note  Patient Details  Name: Robin [name] MRN: [id number] Date of Birth: 1951-03-07  Today's Date: 04/05/2014 Time: 1100-1230   (90 min) Time Calculation (min): 90 min  Short Term Goals: Week 1:  OT Short Term Goal 1 (Week 1): Pt will perform LB bathing sit to stand with supervision. OT Short Term Goal 1 - Progress (Week 1): Not met OT Short Term Goal 2 (Week 1): Pt will perform LB dressing sit to stand with supervision using AE. OT Short Term Goal 2 - Progress (Week 1): Not met OT Short Term Goal 3 (Week 1): Pt will perform toilet transfers with supervision to elevated toilet or 3:1. OT Short Term Goal 3 - Progress (Week 1): Not met OT Short Term Goal 4 (Week 1): Pt will perform 2 grooming tasks in standing with supervision. OT Short Term Goal 4 - Progress (Week 1): Not met OT Short Term Goal 5 (Week 1): Pt will transfer from supine with HOB elevated with min assist in preparation for selfcare tasks.  OT Short Term Goal 5 - Progress (Week 1): Not met Week 2:  OT Short Term Goal 1 (Week 2): Pt will perform LB bathing sit to stand with supervision. OT Short Term Goal 2 (Week 2): Pt will perform LB dressing sit to stand with supervision using AE. OT Short Term Goal 3 (Week 2): Pt will perform toilet transfers with supervision to elevated toilet or 3:1. OT Short Term Goal 4 (Week 2): Pt will perform 2 grooming tasks in standing with supervision. OT Short Term Goal 5 (Week 2): Pt will transfer from supine with HOB elevated with min assist in preparation for selfcare tasks.   Skilled Therapeutic Interventions/Progress Updates:       1st session:  Pt. Sitting in wc upon OT arrival. Focus of treatment was bed mobility, transfers, , standing balance, therapeutic activities, AE instruction.  Pt transferred to bed with minimal assist from sit to stand and was mod assist from sitting to supine.  Performed LB bathing and indepth cleaning and mod assist with powders  And  skin care cloth place under skin folds.  Pt transitioned from supine to sit with bed rails only  (supervision).    Pt performed UB bathing and dressing in wc with set up assist.  Did LB dressing using reacher with increased time and min assist for sit to stand.  Pt. Stated she felt stronger and better today.     Left pt in wc with lunch tray and call bell,phone within reach.    Therapy Documentation Precautions:  Precautions Precautions: Fall Precaution Comments: Bilateral Una boots Restrictions Weight Bearing Restrictions: No      Pain: Pain Assessment Pain Assessment: 0-10 Pain Score: 4/10  1st session Pain Type: Acute pain Pain Location: Shoulder Pain Orientation: Left Pain Descriptors / Indicators: Aching Pain Intervention(s): Medication (See eMAR)     2nd session:  Time:  1400-1445  (45 min) Pain:  3/10 Individual session:  Pt's husband, son, and mother present at family education.  Went over toilet, tub transfers with husband returning demonstration after education.  Emphasized hand placement when ambulating pt and giving appropriate cues.  Pt was minimal assist with sit to stand and min guard with ambulating about 5-6 feet to toilet.  Ambulated to tub bench.  Needed assist getting both legs over side of tub.  Pt does not plan to shower for several weeks after discharge.  Went over bathing and dressing session with pt standing  at sink to don and doff pants with SBA.  Pt and family returned to room.  Transferred pt to recliner and assisted with comfort level.  Pt left with all items in reach.        See FIM for current functional status  Therapy/Group: Individual Therapy  Lisa Roca 04/05/2014, 12:25 PM

## 2014-04-05 NOTE — Progress Notes (Signed)
Social Work Patient ID: Robin [name], female   DOB: 1951-07-27, 63 y.o.   MRN: [id number] Met with pt to discuss if all DME delivered to home, everything was except for the [facility].  Have contacted [facility] to deliver bsc to pt's hospital room  Before tomorrow her discharge date.  Pt reports everything went well and she is ready for discharge tomorrow.

## 2014-04-05 NOTE — Progress Notes (Signed)
Physical Therapy Discharge Summary  Patient Details  Name: Robin [name] MRN: [id number] Date of Birth: [birth year]  Today's Date: 04/05/2014 Time: 2706-2376 and 1300-1350 Time Calculation (min): 45 min and 50 mins  Patient has met 10 of 10 long term goals due to improved activity tolerance, improved balance, improved postural control, increased strength, ability to compensate for deficits and improved coordination.  Patient to discharge at an ambulatory level Supervision for short distances and mod I for w/c mobility.   Patient's care partner is independent to provide the necessary physical assistance at discharge.  Reasons goals not met: n/a  Recommendation:  Patient will benefit from ongoing skilled PT services in home health setting to continue to advance safe functional mobility, address ongoing impairments in decreased balance, decreased endurance, decreased overall strength, and minimize fall risk.  Equipment: hospital bed, w/c, cushion, RW  Reasons for discharge: treatment goals met and discharge from hospital  Patient/family agrees with progress made and goals achieved: Yes  PT treatment/Intervention: AM session:  Pt received lying in bed asleep this morning, agreeable to therapy.  Note she was upset that she had not gotten meds or eaten breakfast prior to session, however she knew that this session needed to get done.  Performed bed mobility with bed rails and with HOB elevated as she will have hospital bed at S level.  Increased difficulty standing initially, however with bed elevated able to stand and transfer at min A level.  RN in room to provide pain meds.  Pt self propelled x 100' at mod I level with BUEs and LEs (intermittently).  Once in hallway at gym, performed 40' gait x 1 with RW in controlled environment with S.  Continue to provide cues for upright posture and increased stride lengths.  Pt assisted back to room and left in w/c with all needs in reach.    PM session:   Pt received sitting in w/c in room, husband and family present for family education and training.  Session focused on car transfers and stair training.  PT performed with pt first, providing cues to husband on how to assist safely.  Performed car transfer at mod A (from simulated personal car height) and had to assist from in front of pt for better body mechanics.  Pts husband returned demonstration with mod cues and demonstration for weight shifting with pt to avoid "lifting."  Performed stairs with pt x 2 reps of 3, 4" steps with B handrails step to, forwards.  Husband returned demonstration, again with cues for assisting.  Did not have time to ambulate again with pt, however provided verbal and demonstration education on where to guard pt, as she is at S level with gait.  Pt and husband verbalized understanding for all aspects of assisting pt.  Pt assisted to gym for next OT session.    PT Discharge Precautions/Restrictions Precautions Precautions: Fall Precaution Comments: Bilateral Una boots Restrictions Weight Bearing Restrictions: No    Cognition Overall Cognitive Status: Within Functional Limits for tasks assessed Arousal/Alertness: Awake/alert Orientation Level: Oriented X4 Memory: Impaired Memory Impairment: Decreased recall of new information Awareness: Appears intact Safety/Judgment: Appears intact Comments: Continues to be somewhat anxious with mobility, however does well with cuing.  Sensation Sensation Light Touch: Impaired by gross assessment Stereognosis: Appears Intact Hot/Cold: Appears Intact Proprioception: Appears Intact Additional Comments: Continues to have decreased sensation in BLEs, also difficult to fully assess due to unna boots.  Coordination Gross Motor Movements are Fluid and Coordinated: Yes Fine Motor Movements  are Fluid and Coordinated: Yes Motor  Motor Motor: Abnormal postural alignment and control Motor - Discharge Observations: Pt continues to  demonstrate overall general weakness, increased neck, trunk flexion in sitting and standing.   Mobility Bed Mobility Bed Mobility: Supine to Sit;Sit to Supine Supine to Sit: HOB elevated;With rails;5: Supervision Supine to Sit Details: Verbal cues for sequencing;Verbal cues for technique;Verbal cues for precautions/safety Sit to Supine: 4: Min assist;With rail;HOB elevated Transfers Transfers: Yes Sit to Stand: 4: Min assist;With armrests;From chair/3-in-1 Sit to Stand Details: Verbal cues for sequencing;Verbal cues for technique;Verbal cues for precautions/safety;Manual facilitation for weight shifting Stand to Sit: 4: Min assist;With armrests;To chair/3-in-1 Stand to Sit Details (indicate cue type and reason): Verbal cues for sequencing;Verbal cues for technique;Verbal cues for precautions/safety Stand Pivot Transfers: 4: Min assist Stand Pivot Transfer Details: Verbal cues for sequencing;Verbal cues for technique;Verbal cues for precautions/safety;Manual facilitation for weight shifting Locomotion  Ambulation Ambulation: Yes Ambulation/Gait Assistance: 5: Supervision Assistive device: Rolling walker Gait Gait: Yes Gait Pattern: Impaired Gait Pattern: Step-to pattern;Decreased step length - left;Shuffle;Trunk flexed;Decreased stride length Stairs / Additional Locomotion Stairs: Yes Stairs Assistance: 3: Mod assist Stairs Assistance Details: Verbal cues for sequencing;Verbal cues for technique;Verbal cues for precautions/safety;Manual facilitation for weight shifting Stair Management Technique: Two rails;Step to pattern;Forwards Height of Stairs: 4 Architect: Yes Wheelchair Assistance: 6: Modified independent (Device/Increase time) Environmental health practitioner: Both upper extremities;Both lower extermities Wheelchair Parts Management: Independent  Trunk/Postural Assessment  Cervical Assessment Cervical Assessment: Exceptions to [facility] Cervical  Strength Overall Cervical Strength Comments: forward head, able to achieve neutral with cuing Thoracic Assessment Thoracic Assessment: Exceptions to [facility] Thoracic Strength Overall Thoracic Strength Comments: kyphotic Lumbar Assessment Lumbar Assessment: Exceptions to [facility] Lumbar Strength Overall Lumbar Strength Comments: posterior pelvic tilt Postural Control Postural Control: Deficits on evaluation Postural Limitations: Depends heavily on UEs  Balance Balance Balance Assessed: Yes Static Sitting Balance Static Sitting - Balance Support: Feet supported Static Sitting - Level of Assistance: 6: Modified independent (Device/Increase time) Dynamic Sitting Balance Dynamic Sitting - Balance Support: Feet supported Dynamic Sitting - Level of Assistance: 6: Modified independent (Device/Increase time) Static Standing Balance Static Standing - Balance Support: During functional activity Static Standing - Level of Assistance: 5: Stand by assistance Dynamic Standing Balance Dynamic Standing - Balance Support: During functional activity Dynamic Standing - Level of Assistance: 5: Stand by assistance Extremity Assessment      RLE Assessment RLE Assessment: Exceptions to [facility] RLE Strength RLE Overall Strength: Deficits RLE Overall Strength Comments: generalized weakness, 3+ to 4-/5 throughout, difficult to assess ankle DF/PF and knee flex (painful calf) due to UNA boot LLE Assessment LLE Assessment: Exceptions to [facility] LLE Strength LLE Overall Strength: Deficits LLE Overall Strength Comments: generalized weakness L > R, 4- to 4/5 throughout, difficult to assess ankle DF/PF and knee flex (painful calf) due to UNA boot  See FIM for current functional status  Denice Bors 04/05/2014, 3:52 PM

## 2014-04-05 NOTE — Progress Notes (Signed)
Social Work Discharge Note Discharge Note  The overall goal for the admission was met for:   Discharge location: [place] MOM TO ASSIST.  HIRING PRIVATE DUTY FOR WHEN HUSBAND WORKING  Length of Stay: Yes-15 DAYS  Discharge activity level: Yes-SUPERVISION/MIN LEVEL  Home/community participation: Yes  Services provided included: MD, RD, PT, OT, RN, CM, TR, Pharmacy and SW  Financial Services: Private Insurance: [place] BCBS  Follow-up services arranged: Home Health: [facility]-PT,OT,RN,AIDE, DME: [place] BED, WIDE ROLLING WALKER, BSC and Patient/Family has no preference for HH/DME agencies  Comments (or additional information):HUSBAND HERE TODAY TO [facility], [place] FEELS COMFORTABLE WITH HER CARE. DME DELIVERED TO HOME. READY FOR D/C TOMORROW.  WORKING ON PRIVATE DUTY CAREGIVER.  PT'S MOM ALSO HERE TO ATTEND THERAPIES WITH  Patient/Family verbalized understanding of follow-up arrangements: Yes  Individual responsible for coordination of the follow-up plan: ROBERT-HUSBAND  Confirmed correct DME delivered: Elease Hashimoto 04/05/2014    Elease Hashimoto

## 2014-04-05 NOTE — Discharge Summary (Signed)
NAMSilvestre Moment:  [name], Robin             ACCOUNT NO.:  [id number]  MEDICAL RECORD NO.:  [id number]  LOCATION:  [id number]                        FACILITY:  [facility]  PHYSICIAN:  Mariam Dollaraniel Angiulli, P.A.  DATE OF BIRTH:  [birth year]  DATE OF ADMISSION:  03/20/2014 DATE OF DISCHARGE:  04/06/2014                              DISCHARGE SUMMARY   DISCHARGE DIAGNOSES: 1. Functional deficits secondary to deconditioning multi medical. 2. Chronic Coumadin for atrial fibrillation. 3. Pain management. 4. Depression. 5. Hypertension. 6. Acute kidney insufficiency, resolving. 7. Acute on chronic anemia. 8. Chronic leg ulcers. 9. Diabetes mellitus with peripheral neuropathy. 10.Hyperlipidemia. 11.MRSA PCR screening positive.  HISTORY OF PRESENT ILLNESS:  This is a 63 year old right-handed female with history of hypertension, chronic Coumadin therapy for atrial fibrillation, chronic leg wounds who was independent prior to admission living with her husband, using a straight point cane.  Presented on March 16, 2014, with generalized weakness, worsening of leg ulcers.  Noted hyperkalemia 7.2 as well as hyponatremia 111, BUN 139, creatinine 2.28. The patient did receive Kayexalate for hyperkalemia.  Intravenous fluids initiated for hyponitremia with normal saline.  Renal Services consulted in relation to acute renal failure.  Renal ultrasound showing no hydronephrosis.  It was felt that her severe hyponatremia possibly related to excess water consumption on top of AKI.  Placed on broad- spectrum antibiotics for chronic leg ulcers.  X-rays of right and left foot showed no evidence of osteomyelitis.  Renal function continued to improve.  Sodium level up to 130.  She remained on chronic Coumadin therapy.  Contact precautions for MRSA PCR screening positive.  The patient was admitted for comprehensive rehab program.  PAST MEDICAL HISTORY:  See discharge diagnoses.  SOCIAL HISTORY:  Lives with spouse.  FUNCTIONAL  HISTORY:  Prior to admission independent with a cane. Functional status upon admission to rehab services was moderate assist sit to stand, +2 physical assist stand and pivot transfers, minutes to mod assist for activities of daily living.  PHYSICAL EXAMINATION:  VITAL SIGNS:  Blood pressure 80/54, pulse 73, temperature 98, respirations 17. GENERAL:  This was an alert female, oriented x3. HEENT:  Pupils round and reactive to light. LUNGS:  Clear to auscultation. CARDIAC:  Rate controlled. ABDOMEN:  Soft, nontender.  Good bowel sounds. EXTREMITIES:  Bilateral lower extremities with dry dressings in place. Scattered ecchymoses upper extremities, stage II gluteal wound left with surrounding excoriated skin.  REHABILITATION HOSPITAL COURSE:  Patient was admitted to inpatient rehab services with therapies initiated on a 3-hour daily basis consisting of physical therapy, occupational therapy, and rehabilitation nursing.  The following issues were addressed during the patient's rehabilitation stay.  Pertaining to Robin [name] functional deficits secondary to deconditioning multi medical, she continued to participate fully with her therapies.  She remained on chronic Coumadin therapy for atrial fibrillation.  Latest INR of 3.26, no bleeding episodes.  Followed by [facility] with a home health nurse arranged.  Pain management with the use of hydrocodone and good results.  Blood pressures remained well controlled.  No orthostatic changes.  The acute on chronic kidney injury followed by Renal Services.  Renal function continued to improve with latest creatinine of 1.43, which was at her  baseline.  Sodium improved to 135 and monitored.  She did have a history of diabetes mellitus, peripheral neuropathy.  Hemoglobin A1c 7.2.  She remained on insulin therapy.  Full diabetic teaching.  Chronic leg ulcers.  She completed empiric courses of antibiotics followed by wound care nurse.  She  remained afebrile.  Contact precautions for MRSA PCR screening positive.  The patient received weekly collaborative interdisciplinary team conferences to discuss estimated length of stay, family teaching, and any barriers to discharge.  Sessions focused on gait training with rolling walker for increase distance and improved quality and car transfers.  Performed ambulation 35 feet rolling walker at supervision level required minimal assist to get in to standing.  Car transfers at [facility] level.  Activities of daily living, she was able to complete upper body bathing and dressing with increased time and supervision.  Overall, her strength and endurance continued to improve. She was encouraged overall progress.  Plans were made for home health physical and occupational therapy and discharge taking place.  DISCHARGE MEDICATIONS:  Included: 1. Xanax 0.5 mg p.o. t.i.d. 2. Urecholine 25 mg p.o. at bedtime. 3. Wellbutrin 300 mg p.o. daily. 4. Lasix 40 mg p.o. daily. 5. Hydrocodone 1 tablet every 4 hours as needed moderate pain,     dispense of #90 tablets. 6. Lantus insulin 30 units subcutaneously at bedtime. 7. Zocor 20 mg p.o. daily. 8. Sodium bicarbonate 650 mg p.o. t.i.d. 9. Coumadin 2 mg daily adjusted accordingly for an INR of 2.0-3.0.  DIET:  Diabetic diet.  SPECIAL INSTRUCTIONS:  The patient would follow up with home health nurse to check INR on April 09, 2014, results to [facility] [phone].  The patient would follow up with Dr. Claudette LawsAndrew Kirsteins, the outpatient rehab center as needed.  Dr. Casimiro NeedleAlvin Powell [facility]al Services as needed.  Dr. Laurann Montanaynthia White, medical management, appointment to be made. A home health nurse had been arranged.  The patient would continue with Unna boots bilaterally as advised for chronic leg ulcers.     Mariam Dollaraniel Angiulli, P.A.     DA/MEDQ  D:  04/05/2014  T:  04/05/2014  Job:  [id number]  cc:   Erick ColaceAndrew E. Kirsteins, M.D. Mindi SlickerAlvin C. Lowell GuitarPowell,  M.D. [facility] Stacie Acresynthia S. Cliffton AstersWhite, M.D.

## 2014-04-05 NOTE — Progress Notes (Signed)
Social Work Patient ID: Robin [name], female   DOB: 12/15/1950, 63 y.o.   MRN: [id number] Pt requires a hospital bed for positioning and pain control.  She needs to elevate her legs and is unable to adequately do this with wedge pillow or Other type pillow.  She also has PVD and B-lower extremity wounds that need to be elevated at night.

## 2014-04-05 NOTE — Discharge Summary (Signed)
Discharge summary job [id number]

## 2014-04-05 NOTE — Discharge Instructions (Signed)
Inpatient Rehab Discharge Instructions  Jewel BaizeJudith M Pollack Discharge date and time: No discharge date for patient encounter.   Activities/Precautions/ Functional Status: Activity: activity as tolerated Diet: diabetic diet Wound Care: keep wound clean and dry Functional status:  ___ No restrictions     ___ Walk up steps independently ___ 24/7 supervision/assistance   ___ Walk up steps with assistance ___ Intermittent supervision/assistance  ___ Bathe/dress independently ___ Walk with walker     ___ Bathe/dress with assistance ___ Walk Independently    ___ Shower independently _x__ Walk with assistance    ___ Shower with assistance ___ No alcohol     ___ Return to work/school ________  Special Instructions: Home health nurse to check INR on 04/09/2014  [facility](Jeremy Smart pharmacist) [phone]   Continued bilateral Unna boots for chronic leg ulcers and changes directed   COMMUNITY REFERRALS UPON DISCHARGE:    Home Health:   PT, OT, RN, AIDE  Agency:[facility] Phone:[phone] Date of last service:04/06/2014    Medical Equipment/Items Ordered:HOSPITAL BED, WHEELCHAIR, BSC, WIDE Levan HurstROLLING WALKER  Agency/Supplier:[facility]    [phone] Other:PRIVATE DUTY AGENCY LIST-PT AND HUSBAND TO FOLLOW UP WITH    My questions have been answered and I understand these instructions. I will adhere to these goals and the provided educational materials after my discharge from the hospital.  Patient/Caregiver Signature _______________________________ Date __________  Clinician Signature _______________________________________ Date __________  Please bring this form and your medication list with you to all your follow-up doctor's appointments.

## 2014-04-06 LAB — GLUCOSE, CAPILLARY
Glucose-Capillary: 113 mg/dL — ABNORMAL HIGH (ref 70–99)
Glucose-Capillary: 151 mg/dL — ABNORMAL HIGH (ref 70–99)

## 2014-04-06 LAB — CBC
HEMATOCRIT: 32.9 % — AB (ref 36.0–46.0)
HEMOGLOBIN: 9.5 g/dL — AB (ref 12.0–15.0)
MCH: 23.2 pg — ABNORMAL LOW (ref 26.0–34.0)
MCHC: 28.9 g/dL — ABNORMAL LOW (ref 30.0–36.0)
MCV: 80.2 fL (ref 78.0–100.0)
Platelets: 284 10*3/uL (ref 150–400)
RBC: 4.1 MIL/uL (ref 3.87–5.11)
RDW: 25.6 % — AB (ref 11.5–15.5)
WBC: 5.6 10*3/uL (ref 4.0–10.5)

## 2014-04-06 LAB — PROTIME-INR
INR: 4 — ABNORMAL HIGH (ref 0.00–1.49)
Prothrombin Time: 39 seconds — ABNORMAL HIGH (ref 11.6–15.2)

## 2014-04-06 NOTE — Progress Notes (Signed)
Patient ID: Robin [name], female   DOB: 10/19/1950, 63 y.o.   MRN: [id number]    Patient ID: Robin [name], female   DOB: 05/11/1951, 63 y.o.   MRN: [id number]  Patient ID: Robin [name], female   DOB: 08/11/1951, [age] y.o.   MRN: [id number]    [place] [facility]ICAL MEDICINE & REHABILITATION     PROGRESS NOTE  04/06/14.  Subjective/Complaints:  63 y/o admit for CIR  With functional deficits secondary to Deconditioning after multi-medical iisssues  No new issues.  Anxious about d/c today.  Unna boots feel better   Past Medical History  Diagnosis Date  . Diabetes mellitus   . Hypertension   . Hyperlipidemia   . Atrial fibrillation   . Sleep apnea   . Cholelithiasis   . CAD (coronary artery disease)     atrial fibrillation    Review of Systems - Negative except as above Objective: Vital Signs: Blood pressure 119/68, pulse 85, temperature 98.3 F (36.8 C), temperature source Oral, resp. rate 18, height 5\' 6"  (1.676 m), weight 248 lb (112.492 kg), SpO2 94.00%. No results found.  Recent Labs  04/06/14 0448  WBC 5.6  HGB 9.5*  HCT 32.9*  PLT 284   No results found for this basename: NA, K, CL, CO, GLUCOSE, BUN, CREATININE, CALCIUM,  in the last 72 hours CBG (last 3)   Recent Labs  04/05/14 1642 04/05/14 2059 04/06/14 0717  GLUCAP 109* 181* 113*    Wt Readings from Last 3 Encounters:  03/27/14 248 lb (112.492 kg)  03/20/14 245 lb 13 oz (111.5 kg)  10/25/11 296 lb (134.265 kg)   BMET    Component Value Date/Time   NA 135* 03/31/2014 0418   K 3.5* 03/31/2014 0418   CL 95* 03/31/2014 0418   CO2 27 03/31/2014 0418   GLUCOSE 90 03/31/2014 0418   BUN 42* 03/31/2014 0418   CREATININE 1.43* 03/31/2014 0418   CALCIUM 8.1* 03/31/2014 0418   GFRNONAA 39* 03/31/2014 0418   GFRAA 45* 03/31/2014 0418   Lab Results  Component Value Date   INR 4.00* 04/06/2014   INR 3.33* 04/05/2014   INR 3.26* 04/04/2014    Intake/Output Summary (Last 24 hours) at 04/06/14 0829 Last  data filed at 04/06/14 0359  Gross per 24 hour  Intake    960 ml  Output      0 ml  Net    960 ml   Patient Vitals for the past 24 hrs:  BP Temp Temp src Pulse Resp SpO2  04/06/14 0500 119/68 mmHg 98.3 F (36.8 C) Oral 85 18 94 %  04/05/14 2300 124/69 mmHg 97.5 F (36.4 C) Oral 80 18 96 %  04/05/14 1500 136/73 mmHg 98 F (36.7 C) Oral 86 18 96 %   Physical Exam:  Constitutional: She is oriented to person, place, and time. obese  HENT: oral mucosa pink and moist  Head: Normocephalic.  Eyes: EOM are normal.  Neck: Normal range of motion. Neck supple. No thyromegaly present.  Cardiovascular:  Cardiac rate controlled.Irreg irreg  No murmur  Respiratory: Effort normal and breath sounds normal. No respiratory distress.  GI: Soft. Bowel sounds are normal. She exhibits no distension.  Musculoskeletal:  1+ bilateral LE , bilateral unna dressings in place Neurological: She is alert and oriented to person, place, and time.   Bilateral lower extremities with Unna boots. Scattered ecchymoses in UE's. A few abrasions areas over toes are visible beyond unna's.  Neck has decreased  range of motion with lateral bending toward the left and rotation to the left. Mild tenderness to palpation along the cervical paraspinal muscles and lateral masses on left side. Upper extremity strength 4/5 bilateral deltoid, bicep, tricep, grip extremity strength 3/5 bilateral hip flexors 4/5 knee extensor ankle dorsi flexion plantar flexor  Assessment/Plan:  Medical Problem List and Plan:  1. Functional deficits secondary to Deconditioning after multi-medical iisssues  2. DVT Prophylaxis/Anticoagulation: Chronic Coumadin therapy for atrial fibrillation. Monitor for any bleeding episodes  INR up to 4 today; will need close OP f/u. 3. Pain Management: Hydrocodone as needed. Monitor with increased mobility  4. Mood/depression: Wellbutrin 300 mg daily, xanax to 0.5mg  tid prn.  5. Neuropsych: This patient is capable  of making decisions on his own behalf.  6. Atrial fibrillation. Continue Coumadin therapy as directed. Cardiac rate control. No chest pain or shortness of breath at present 7. Hypertension. Lasix 40 mg daily,Stopped Lopressor 12.5 mg twice a day due to Low BP. Monitor with increased mobility  8. Chronic leg ulcers. Empiric doxycycline/Cipro. [facility]  -may be able to dc unna's soon- will need comp hose vs ACE wraps  9. Diabetes mellitus with peripheral neuropathy. Hemoglobin A1c 7.2. Lantus insulin 30 units each bedtime. Check CBGs a.c. and at bedtime.   -sugars acceptable 10. Hyperlipidemia. Zocor  11. Stable for d/c; Home PT/OT setup.      LOS (Days) 15 A FACE TO FACE EVALUATION WAS PERFORMED  Robin [name] 04/06/2014 8:29 AM

## 2014-04-06 NOTE — Progress Notes (Signed)
ANTICOAGULATION CONSULT NOTE - Follow Up Consult  Pharmacy Consult for coumadin Indication: atrial fibrillation  No Known Allergies  Patient Measurements: Height: 5\' 6"  (167.6 cm) Weight: 248 lb (112.492 kg) IBW/kg (Calculated) : 59.3  Vital Signs: Temp: 98.3 F (36.8 C) (07/04 0500) Temp src: Oral (07/04 0500) BP: 119/68 mmHg (07/04 0500) Pulse Rate: 85 (07/04 0500)  Labs:  Recent Labs  04/04/14 0430 04/05/14 0530 04/06/14 0448  HGB  --   --  9.5*  HCT  --   --  32.9*  PLT  --   --  284  LABPROT 33.2* 33.8* 39.0*  INR 3.26* 3.33* 4.00*    Estimated Creatinine Clearance: 52.6 ml/min (by C-G formula based on Cr of 1.43).   Medications:  Scheduled:  . ALPRAZolam  0.5 mg Oral TID  . bethanechol  25 mg Oral QHS  . buPROPion  300 mg Oral Daily  . furosemide  40 mg Oral Daily  . insulin aspart  0-9 Units Subcutaneous TID WC  . insulin glargine  30 Units Subcutaneous QHS  . nystatin   Topical BID  . simvastatin  20 mg Oral q1800  . sodium bicarbonate  650 mg Oral TID  . Warfarin - Pharmacist Dosing Inpatient   Does not apply q1800   Infusions:    Assessment: 62 YOF resumed on warfarin from PTA with a SUPRAtherapeutic INR today (INR 4 << 3.33, goal of 2-3), CBC today stable from prior CBC on 6/22 - no overt s/sx of bleeding noted.   Discharge summary was written on 7/3 with an active warfarin dose entered by the PA. Left order active since dose was likely okay (a little higher than we had originally planned to dose) - pt remains in the hospital on 7/4 and INR has continued to rise. Will plan to hold warfarin dose today.  If the patient is discharged today - would recommend holding today and tomorrow - and getting a repeat PT/INR check by Monday, 7/6.   Goal of Therapy:  INR 2-3 Monitor platelets by anticoagulation protocol: Yes   Plan:  1. Hold warfarin dose today 2. Will continue to monitor for any signs/symptoms of bleeding and will follow up with PT/INR in  the a.m. (if still here)  Georgina PillionElizabeth Sherial Ebrahim, PharmD, BCPS Clinical Pharmacist Pager: [phone] 04/06/2014 8:36 AM

## 2014-04-06 NOTE — Progress Notes (Signed)
[facility] wanting to make sure UNA boots get changed today. Una boots in place with redness to toes.  Left shoulder pain managed by 1 PRN vicodin and Kpad. Alfredo MartinezMurray, Donnajean Chesnut A

## 2014-04-06 NOTE — Progress Notes (Signed)
[facility] called. Bedside commode sent yesterday not wide enough. Patient requesting BSC liked using in the hospital. Patient made aware a bariatric bedside commode for her would be out of pocket and made aware of cost verbalized by [facility] caller. Patient verbalized understanding. All other equipment already delivered to patient's house. Discharge instructions given via Harvel Ricksan Anguilli PA yesterday. Patient denied any questions. Orders in for home health nurse to change UNA boots. Patient vitals stable. Patient discharged home about [id number] with family and all dc instructions/belongings.

## 2014-04-08 NOTE — Progress Notes (Signed)
Occupational Therapy Discharge Summary  Patient Details  Name: Robin [name] MRN: [id number] Date of Birth: 1950/12/28  Today's Date: 04/08/2014  Patient has met 7 of 9 long term goals due to improved activity tolerance, improved balance and ability to compensate for deficits.  Patient to discharge at [facility] Assist level.  Patient's care partner is independent to provide the necessary physical assistance at discharge.    Reasons goals not met: Pt continues to need mod assist for LB dressing  Recommendation:  Patient will benefit from ongoing skilled OT services in home health setting to continue to advance functional skills in the area of BADL.  Feel pt will need 24 hour supervision initially secondary to still needing assist for all sit to stand transitions related to toileting, selfcare, and general transfers.  She continues to exhibit decreased overall flexibility and strength in her LEs resulting in the need for AE and DME to increased independence.  Recommend continued HHOT to further increase overall independence in order to reach modified independent level.    Equipment: 3:1 and RW  Reasons for discharge: treatment goals met and discharge from hospital  Patient/family agrees with progress made and goals achieved: Yes  OT Discharge Precautions/Restrictions  Precautions Precautions: Fall Precaution Comments: Bilateral Una boots Restrictions Weight Bearing Restrictions: No  ADL  See FIM scale for details  Vision/Perception  Vision- History Baseline Vision/History: Wears glasses Wears Glasses: At all times Patient Visual Report: No change from baseline Vision- Assessment Vision Assessment?: No apparent visual deficits  Cognition Overall Cognitive Status: Within Functional Limits for tasks assessed Arousal/Alertness: Awake/alert Orientation Level: Oriented X4 Memory: Impaired Awareness: Appears intact Safety/Judgment: Appears intact Sensation  WFLs for  bilateral UEs Motor  Motor Motor - Discharge Observations: Pt continues to demonstrate overall general weakness, increased neck, trunk flexion in sitting and standing.  Mobility  Bed Mobility Bed Mobility: Supine to Sit;Sit to Supine Supine to Sit: HOB elevated;With rails;5: Supervision Supine to Sit Details: Verbal cues for sequencing;Verbal cues for technique;Verbal cues for precautions/safety Sit to Supine: 4: Min assist;With rail;HOB elevated Sit to Supine - Details: Verbal cues for safe use of DME/AE Transfers Transfers: Sit to Stand;Stand to Sit Sit to Stand: 4: Min assist;With armrests;Without upper extremity assist;From chair/3-in-1 Sit to Stand Details: Verbal cues for sequencing;Verbal cues for technique;Verbal cues for precautions/safety;Manual facilitation for weight shifting Stand to Sit: 4: Min assist;With armrests;To chair/3-in-1;With upper extremity assist Stand to Sit Details (indicate cue type and reason): Verbal cues for sequencing;Verbal cues for technique;Verbal cues for precautions/safety  Trunk/Postural Assessment  Cervical Assessment Cervical Assessment: Exceptions to [facility] Cervical Strength Overall Cervical Strength Comments: forward head, able to achieve neutral with cuing Thoracic Assessment Thoracic Assessment: Exceptions to [facility] Thoracic Strength Overall Thoracic Strength Comments: thoracic kyphosis Lumbar Assessment Lumbar Assessment: Exceptions to [facility] Lumbar Strength Overall Lumbar Strength Comments: Sits in posterior pelvic tilt with decreased lumbar extension  Balance Balance Balance Assessed: Yes Dynamic Sitting Balance Dynamic Sitting - Balance Support: Feet supported Dynamic Sitting - Level of Assistance: 6: Modified independent (Device/Increase time) Sitting balance - Comments:   Static Standing Balance Static Standing - Balance Support: During functional activity Dynamic Standing Balance Dynamic Standing - Balance Support: During functional  activity Dynamic Standing - Level of Assistance: 5: Stand by assistance Extremity/Trunk Assessment RUE Strength RUE Overall Strength Comments: AROM shoulder flexion 0-120 degrees bilaterally, all other joints AROM WFLs, strength 4/5 throughout. LUE AROM (degrees) LUE Overall AROM Comments: AROM shoulder flexion 0-120 degrees bilaterally, all other joints AROM  WFLs, shoulder strength 4/5 throughout, all other joints 4/5.  See FIM for current functional status  Robin [name] OTR/L 04/08/2014, 12:52 PM

## 2014-04-08 NOTE — Plan of Care (Signed)
Problem: RH Dressing Goal: LTG Patient will perform lower body dressing w/assist (OT) LTG: Patient will perform lower body dressing with assist, with/without cues in positioning using equipment (OT)  Outcome: Not Met (add Reason) Pt continues to need mod assist.  Problem: RH Simple Meal Prep Goal: LTG Patient will perform simple meal prep w/assist (OT) LTG: Patient will perform simple meal prep with assistance, with/without cues (OT).  Outcome: Not Met (add Reason) Goal not addressed

## 2014-04-09 ENCOUNTER — Ambulatory Visit (INDEPENDENT_AMBULATORY_CARE_PROVIDER_SITE_OTHER): Payer: BC Managed Care – PPO | Admitting: Cardiology

## 2014-04-09 LAB — POCT INR: INR: 3.1

## 2014-04-15 ENCOUNTER — Emergency Department (HOSPITAL_COMMUNITY): Payer: BC Managed Care – PPO

## 2014-04-15 ENCOUNTER — Telehealth: Payer: Self-pay | Admitting: Cardiology

## 2014-04-15 ENCOUNTER — Inpatient Hospital Stay (HOSPITAL_COMMUNITY)
Admission: EM | Admit: 2014-04-15 | Discharge: 2014-05-04 | DRG: 291 | Disposition: E | Payer: BC Managed Care – PPO | Attending: Internal Medicine | Admitting: Internal Medicine

## 2014-04-15 ENCOUNTER — Encounter (HOSPITAL_COMMUNITY): Payer: Self-pay | Admitting: Emergency Medicine

## 2014-04-15 DIAGNOSIS — I4891 Unspecified atrial fibrillation: Secondary | ICD-10-CM

## 2014-04-15 DIAGNOSIS — R7989 Other specified abnormal findings of blood chemistry: Secondary | ICD-10-CM

## 2014-04-15 DIAGNOSIS — L97919 Non-pressure chronic ulcer of unspecified part of right lower leg with unspecified severity: Secondary | ICD-10-CM | POA: Diagnosis present

## 2014-04-15 DIAGNOSIS — E119 Type 2 diabetes mellitus without complications: Secondary | ICD-10-CM

## 2014-04-15 DIAGNOSIS — L538 Other specified erythematous conditions: Secondary | ICD-10-CM | POA: Diagnosis present

## 2014-04-15 DIAGNOSIS — E872 Acidosis, unspecified: Secondary | ICD-10-CM | POA: Diagnosis not present

## 2014-04-15 DIAGNOSIS — Z9119 Patient's noncompliance with other medical treatment and regimen: Secondary | ICD-10-CM

## 2014-04-15 DIAGNOSIS — J9620 Acute and chronic respiratory failure, unspecified whether with hypoxia or hypercapnia: Secondary | ICD-10-CM

## 2014-04-15 DIAGNOSIS — Z6841 Body Mass Index (BMI) 40.0 and over, adult: Secondary | ICD-10-CM

## 2014-04-15 DIAGNOSIS — K761 Chronic passive congestion of liver: Secondary | ICD-10-CM | POA: Diagnosis present

## 2014-04-15 DIAGNOSIS — N189 Chronic kidney disease, unspecified: Secondary | ICD-10-CM

## 2014-04-15 DIAGNOSIS — I959 Hypotension, unspecified: Secondary | ICD-10-CM | POA: Diagnosis not present

## 2014-04-15 DIAGNOSIS — J9601 Acute respiratory failure with hypoxia: Secondary | ICD-10-CM

## 2014-04-15 DIAGNOSIS — R0602 Shortness of breath: Secondary | ICD-10-CM

## 2014-04-15 DIAGNOSIS — R319 Hematuria, unspecified: Secondary | ICD-10-CM

## 2014-04-15 DIAGNOSIS — I4819 Other persistent atrial fibrillation: Secondary | ICD-10-CM

## 2014-04-15 DIAGNOSIS — Z79899 Other long term (current) drug therapy: Secondary | ICD-10-CM

## 2014-04-15 DIAGNOSIS — N390 Urinary tract infection, site not specified: Secondary | ICD-10-CM

## 2014-04-15 DIAGNOSIS — J189 Pneumonia, unspecified organism: Secondary | ICD-10-CM

## 2014-04-15 DIAGNOSIS — I5043 Acute on chronic combined systolic (congestive) and diastolic (congestive) heart failure: Principal | ICD-10-CM | POA: Diagnosis present

## 2014-04-15 DIAGNOSIS — Z91199 Patient's noncompliance with other medical treatment and regimen due to unspecified reason: Secondary | ICD-10-CM

## 2014-04-15 DIAGNOSIS — Z794 Long term (current) use of insulin: Secondary | ICD-10-CM

## 2014-04-15 DIAGNOSIS — I517 Cardiomegaly: Secondary | ICD-10-CM

## 2014-04-15 DIAGNOSIS — K769 Liver disease, unspecified: Secondary | ICD-10-CM | POA: Diagnosis present

## 2014-04-15 DIAGNOSIS — N183 Chronic kidney disease, stage 3 unspecified: Secondary | ICD-10-CM

## 2014-04-15 DIAGNOSIS — L97209 Non-pressure chronic ulcer of unspecified calf with unspecified severity: Secondary | ICD-10-CM | POA: Diagnosis present

## 2014-04-15 DIAGNOSIS — E0821 Diabetes mellitus due to underlying condition with diabetic nephropathy: Secondary | ICD-10-CM | POA: Diagnosis present

## 2014-04-15 DIAGNOSIS — J810 Acute pulmonary edema: Secondary | ICD-10-CM

## 2014-04-15 DIAGNOSIS — I83009 Varicose veins of unspecified lower extremity with ulcer of unspecified site: Secondary | ICD-10-CM | POA: Diagnosis present

## 2014-04-15 DIAGNOSIS — I5033 Acute on chronic diastolic (congestive) heart failure: Secondary | ICD-10-CM

## 2014-04-15 DIAGNOSIS — B372 Candidiasis of skin and nail: Secondary | ICD-10-CM | POA: Diagnosis present

## 2014-04-15 DIAGNOSIS — E8809 Other disorders of plasma-protein metabolism, not elsewhere classified: Secondary | ICD-10-CM | POA: Diagnosis present

## 2014-04-15 DIAGNOSIS — Z8249 Family history of ischemic heart disease and other diseases of the circulatory system: Secondary | ICD-10-CM

## 2014-04-15 DIAGNOSIS — Z66 Do not resuscitate: Secondary | ICD-10-CM | POA: Diagnosis not present

## 2014-04-15 DIAGNOSIS — R601 Generalized edema: Secondary | ICD-10-CM

## 2014-04-15 DIAGNOSIS — I5032 Chronic diastolic (congestive) heart failure: Secondary | ICD-10-CM

## 2014-04-15 DIAGNOSIS — J9602 Acute respiratory failure with hypercapnia: Secondary | ICD-10-CM

## 2014-04-15 DIAGNOSIS — L97929 Non-pressure chronic ulcer of unspecified part of left lower leg with unspecified severity: Secondary | ICD-10-CM

## 2014-04-15 DIAGNOSIS — I10 Essential (primary) hypertension: Secondary | ICD-10-CM | POA: Diagnosis present

## 2014-04-15 DIAGNOSIS — I872 Venous insufficiency (chronic) (peripheral): Secondary | ICD-10-CM

## 2014-04-15 DIAGNOSIS — N058 Unspecified nephritic syndrome with other morphologic changes: Secondary | ICD-10-CM | POA: Diagnosis present

## 2014-04-15 DIAGNOSIS — I129 Hypertensive chronic kidney disease with stage 1 through stage 4 chronic kidney disease, or unspecified chronic kidney disease: Secondary | ICD-10-CM | POA: Diagnosis present

## 2014-04-15 DIAGNOSIS — E669 Obesity, unspecified: Secondary | ICD-10-CM

## 2014-04-15 DIAGNOSIS — E876 Hypokalemia: Secondary | ICD-10-CM | POA: Diagnosis not present

## 2014-04-15 DIAGNOSIS — T45515A Adverse effect of anticoagulants, initial encounter: Secondary | ICD-10-CM | POA: Diagnosis present

## 2014-04-15 DIAGNOSIS — I482 Chronic atrial fibrillation, unspecified: Secondary | ICD-10-CM

## 2014-04-15 DIAGNOSIS — R791 Abnormal coagulation profile: Secondary | ICD-10-CM | POA: Diagnosis present

## 2014-04-15 DIAGNOSIS — Z7901 Long term (current) use of anticoagulants: Secondary | ICD-10-CM

## 2014-04-15 DIAGNOSIS — L89109 Pressure ulcer of unspecified part of back, unspecified stage: Secondary | ICD-10-CM | POA: Diagnosis present

## 2014-04-15 DIAGNOSIS — T502X5A Adverse effect of carbonic-anhydrase inhibitors, benzothiadiazides and other diuretics, initial encounter: Secondary | ICD-10-CM | POA: Diagnosis not present

## 2014-04-15 DIAGNOSIS — D649 Anemia, unspecified: Secondary | ICD-10-CM | POA: Diagnosis not present

## 2014-04-15 DIAGNOSIS — E1169 Type 2 diabetes mellitus with other specified complication: Secondary | ICD-10-CM | POA: Diagnosis present

## 2014-04-15 DIAGNOSIS — E785 Hyperlipidemia, unspecified: Secondary | ICD-10-CM | POA: Diagnosis present

## 2014-04-15 DIAGNOSIS — Z515 Encounter for palliative care: Secondary | ICD-10-CM

## 2014-04-15 DIAGNOSIS — E46 Unspecified protein-calorie malnutrition: Secondary | ICD-10-CM | POA: Diagnosis present

## 2014-04-15 DIAGNOSIS — L8992 Pressure ulcer of unspecified site, stage 2: Secondary | ICD-10-CM | POA: Diagnosis present

## 2014-04-15 DIAGNOSIS — I509 Heart failure, unspecified: Secondary | ICD-10-CM

## 2014-04-15 DIAGNOSIS — I5031 Acute diastolic (congestive) heart failure: Secondary | ICD-10-CM | POA: Diagnosis present

## 2014-04-15 DIAGNOSIS — I2510 Atherosclerotic heart disease of native coronary artery without angina pectoris: Secondary | ICD-10-CM | POA: Diagnosis present

## 2014-04-15 DIAGNOSIS — N179 Acute kidney failure, unspecified: Secondary | ICD-10-CM | POA: Diagnosis present

## 2014-04-15 DIAGNOSIS — E662 Morbid (severe) obesity with alveolar hypoventilation: Secondary | ICD-10-CM

## 2014-04-15 DIAGNOSIS — L97909 Non-pressure chronic ulcer of unspecified part of unspecified lower leg with unspecified severity: Secondary | ICD-10-CM

## 2014-04-15 DIAGNOSIS — G4733 Obstructive sleep apnea (adult) (pediatric): Secondary | ICD-10-CM

## 2014-04-15 DIAGNOSIS — E1129 Type 2 diabetes mellitus with other diabetic kidney complication: Secondary | ICD-10-CM | POA: Diagnosis present

## 2014-04-15 HISTORY — DX: Chronic kidney disease, unspecified: N18.9

## 2014-04-15 HISTORY — DX: Shortness of breath: R06.02

## 2014-04-15 LAB — COMPREHENSIVE METABOLIC PANEL
ALK PHOS: 79 U/L (ref 39–117)
ALT: 6 U/L (ref 0–35)
AST: 14 U/L (ref 0–37)
Albumin: 2.4 g/dL — ABNORMAL LOW (ref 3.5–5.2)
Anion gap: 15 (ref 5–15)
BILIRUBIN TOTAL: 0.6 mg/dL (ref 0.3–1.2)
BUN: 21 mg/dL (ref 6–23)
CALCIUM: 8.6 mg/dL (ref 8.4–10.5)
CO2: 26 meq/L (ref 19–32)
Chloride: 98 mEq/L (ref 96–112)
Creatinine, Ser: 1.1 mg/dL (ref 0.50–1.10)
GFR, EST AFRICAN AMERICAN: 61 mL/min — AB (ref >90)
GFR, EST NON AFRICAN AMERICAN: 53 mL/min — AB (ref >90)
GLUCOSE: 138 mg/dL — AB (ref 70–99)
POTASSIUM: 3.5 meq/L — AB (ref 3.7–5.3)
SODIUM: 139 meq/L (ref 137–147)
Total Protein: 5.7 g/dL — ABNORMAL LOW (ref 6.0–8.3)

## 2014-04-15 LAB — URINALYSIS, ROUTINE W REFLEX MICROSCOPIC
Glucose, UA: NEGATIVE mg/dL
KETONES UR: 15 mg/dL — AB
NITRITE: NEGATIVE
PH: 5 (ref 5.0–8.0)
PROTEIN: 100 mg/dL — AB
Specific Gravity, Urine: 1.024 (ref 1.005–1.030)
Urobilinogen, UA: 0.2 mg/dL (ref 0.0–1.0)

## 2014-04-15 LAB — CBC WITH DIFFERENTIAL/PLATELET
BASOS PCT: 1 % (ref 0–1)
Basophils Absolute: 0.1 10*3/uL (ref 0.0–0.1)
EOS PCT: 1 % (ref 0–5)
Eosinophils Absolute: 0.1 10*3/uL (ref 0.0–0.7)
HCT: 36.7 % (ref 36.0–46.0)
HEMOGLOBIN: 10.5 g/dL — AB (ref 12.0–15.0)
Lymphocytes Relative: 9 % — ABNORMAL LOW (ref 12–46)
Lymphs Abs: 0.7 10*3/uL (ref 0.7–4.0)
MCH: 23.3 pg — AB (ref 26.0–34.0)
MCHC: 28.6 g/dL — ABNORMAL LOW (ref 30.0–36.0)
MCV: 81.4 fL (ref 78.0–100.0)
Monocytes Absolute: 0.7 10*3/uL (ref 0.1–1.0)
Monocytes Relative: 9 % (ref 3–12)
NEUTROS PCT: 80 % — AB (ref 43–77)
Neutro Abs: 6 10*3/uL (ref 1.7–7.7)
Platelets: 305 10*3/uL (ref 150–400)
RBC: 4.51 MIL/uL (ref 3.87–5.11)
RDW: 24.1 % — ABNORMAL HIGH (ref 11.5–15.5)
WBC: 7.6 10*3/uL (ref 4.0–10.5)

## 2014-04-15 LAB — GLUCOSE, CAPILLARY
GLUCOSE-CAPILLARY: 97 mg/dL (ref 70–99)
Glucose-Capillary: 140 mg/dL — ABNORMAL HIGH (ref 70–99)

## 2014-04-15 LAB — URINE MICROSCOPIC-ADD ON

## 2014-04-15 LAB — PROTIME-INR
INR: 4.28 — ABNORMAL HIGH (ref 0.00–1.49)
Prothrombin Time: 41.1 seconds — ABNORMAL HIGH (ref 11.6–15.2)

## 2014-04-15 LAB — STREP PNEUMONIAE URINARY ANTIGEN: STREP PNEUMO URINARY ANTIGEN: NEGATIVE

## 2014-04-15 LAB — TROPONIN I
Troponin I: <0.3 ng/mL (ref <0.30)
Troponin I: <0.3 ng/mL (ref <0.30)
Troponin I: <0.3 ng/mL (ref <0.30)

## 2014-04-15 LAB — PRO B NATRIURETIC PEPTIDE: Pro B Natriuretic peptide (BNP): 9984 pg/mL — ABNORMAL HIGH (ref 0–125)

## 2014-04-15 LAB — HIV ANTIBODY (ROUTINE TESTING W REFLEX): HIV 1&2 Ab, 4th Generation: NONREACTIVE

## 2014-04-15 MED ORDER — INSULIN ASPART 100 UNIT/ML ~~LOC~~ SOLN
0.0000 [IU] | Freq: Three times a day (TID) | SUBCUTANEOUS | Status: DC
  Administered 2014-04-16: 2 [IU] via SUBCUTANEOUS
  Administered 2014-04-17: 3 [IU] via SUBCUTANEOUS
  Administered 2014-04-18: 2 [IU] via SUBCUTANEOUS
  Administered 2014-04-19: 3 [IU] via SUBCUTANEOUS
  Administered 2014-04-20 – 2014-04-26 (×7): 2 [IU] via SUBCUTANEOUS
  Administered 2014-04-26: 11 [IU] via SUBCUTANEOUS
  Administered 2014-04-30: 2 [IU] via SUBCUTANEOUS

## 2014-04-15 MED ORDER — ALPRAZOLAM 0.5 MG PO TABS
0.5000 mg | ORAL_TABLET | Freq: Three times a day (TID) | ORAL | Status: DC
  Administered 2014-04-15 – 2014-04-21 (×15): 0.5 mg via ORAL
  Filled 2014-04-15 (×15): qty 1

## 2014-04-15 MED ORDER — BUPROPION HCL ER (XL) 300 MG PO TB24
300.0000 mg | ORAL_TABLET | Freq: Every day | ORAL | Status: DC
  Administered 2014-04-15 – 2014-04-28 (×14): 300 mg via ORAL
  Filled 2014-04-15 (×18): qty 1

## 2014-04-15 MED ORDER — ACETAMINOPHEN 325 MG PO TABS: 650.0000 mg | ORAL_TABLET | Freq: Four times a day (QID) | ORAL | Status: DC | PRN

## 2014-04-15 MED ORDER — INSULIN ASPART 100 UNIT/ML ~~LOC~~ SOLN: 0.0000 [IU] | Freq: Every day | SUBCUTANEOUS | Status: DC

## 2014-04-15 MED ORDER — SIMVASTATIN 20 MG PO TABS
20.0000 mg | ORAL_TABLET | Freq: Every day | ORAL | Status: DC
  Administered 2014-04-15 – 2014-04-28 (×14): 20 mg via ORAL
  Filled 2014-04-15 (×18): qty 1

## 2014-04-15 MED ORDER — VANCOMYCIN HCL 500 MG IV SOLR
500.0000 mg | INTRAVENOUS | Status: DC
  Filled 2014-04-15: qty 500

## 2014-04-15 MED ORDER — ONDANSETRON HCL 4 MG PO TABS: 4.0000 mg | ORAL_TABLET | Freq: Four times a day (QID) | ORAL | Status: DC | PRN

## 2014-04-15 MED ORDER — INSULIN GLARGINE 100 UNIT/ML ~~LOC~~ SOLN
30.0000 [IU] | Freq: Every day | SUBCUTANEOUS | Status: DC
  Administered 2014-04-15 – 2014-04-16 (×2): 30 [IU] via SUBCUTANEOUS
  Filled 2014-04-15 (×4): qty 0.3

## 2014-04-15 MED ORDER — ACETAMINOPHEN 650 MG RE SUPP: 650.0000 mg | Freq: Four times a day (QID) | RECTAL | Status: DC | PRN

## 2014-04-15 MED ORDER — WARFARIN - PHARMACIST DOSING INPATIENT
Freq: Every day | Status: DC
  Administered 2014-04-25: 18:00:00

## 2014-04-15 MED ORDER — VANCOMYCIN HCL IN DEXTROSE 1-5 GM/200ML-% IV SOLN
1000.0000 mg | Freq: Two times a day (BID) | INTRAVENOUS | Status: DC
  Administered 2014-04-15: 1000 mg via INTRAVENOUS
  Filled 2014-04-15 (×2): qty 200

## 2014-04-15 MED ORDER — DEXTROSE 5 % IV SOLN: 2.0000 g | Freq: Two times a day (BID) | INTRAVENOUS | Status: DC

## 2014-04-15 MED ORDER — SODIUM CHLORIDE 0.9 % IJ SOLN
3.0000 mL | Freq: Two times a day (BID) | INTRAMUSCULAR | Status: DC
  Administered 2014-04-15 – 2014-04-25 (×15): 3 mL via INTRAVENOUS

## 2014-04-15 MED ORDER — WARFARIN SODIUM 5 MG PO TABS: 5.0000 mg | ORAL_TABLET | Freq: Every day | ORAL | Status: DC

## 2014-04-15 MED ORDER — MORPHINE SULFATE 2 MG/ML IJ SOLN: 2.0000 mg | INTRAMUSCULAR | Status: DC | PRN

## 2014-04-15 MED ORDER — ONDANSETRON HCL 4 MG/2ML IJ SOLN
4.0000 mg | Freq: Four times a day (QID) | INTRAMUSCULAR | Status: DC | PRN
  Administered 2014-04-17: 4 mg via INTRAVENOUS
  Filled 2014-04-15: qty 2

## 2014-04-15 MED ORDER — DEXTROSE 5 % IV SOLN
1.0000 g | Freq: Three times a day (TID) | INTRAVENOUS | Status: DC
  Administered 2014-04-15 – 2014-04-16 (×3): 1 g via INTRAVENOUS
  Filled 2014-04-15 (×5): qty 1

## 2014-04-15 MED ORDER — VANCOMYCIN HCL 10 G IV SOLR
1500.0000 mg | Freq: Once | INTRAVENOUS | Status: AC
  Administered 2014-04-15: 1500 mg via INTRAVENOUS
  Filled 2014-04-15: qty 1500

## 2014-04-15 MED ORDER — HYDROCODONE-ACETAMINOPHEN 5-325 MG PO TABS
1.0000 | ORAL_TABLET | ORAL | Status: DC | PRN
  Administered 2014-04-15 – 2014-04-23 (×12): 1 via ORAL
  Filled 2014-04-15 (×12): qty 1

## 2014-04-15 MED ORDER — FUROSEMIDE 10 MG/ML IJ SOLN
40.0000 mg | Freq: Every day | INTRAMUSCULAR | Status: DC
  Administered 2014-04-15: 40 mg via INTRAVENOUS
  Filled 2014-04-15 (×2): qty 4

## 2014-04-15 NOTE — H&P (Signed)
[facility] History and Physical  INETTA DICKE ZOX:[id number] DOB: 11/04/1950 DOA: 04/26/2014  Referring physician: Emergency Department PCP: [name], MD  Specialists:   Chief Complaint: SOB  HPI: Robin [name] is a 63 y.o. female  With a hx of DM, afib, HTN, CAD who presents to the ED with increasing swelling and sob over the past 4-5 days. Pt was recently discharged from inpatient rehab with generalized weakness and functional decline in the setting of chronic venous stasis ulcers and acute renal failure. The patient was noted to have hyponatremia while in rehab with concerns for volume overload secondary to excess water consumption in the setting of acute renal failure. The patient's sodium was eventually improved with fluid restriction. Pt was ultimately discharged from rehab with PO lasix. Since discharge, pt reports being told to increase hydration. Pt had since noted worsening sob and decreased exercise tolerance with edema extending through the upper extremities. Pt then presented to the ED where BNP was noted to be 9984 with CXR findings of pulm edema with B new lower lobe consolidation suggestive of PNA. Hospitalist was consulted for consideration for admission.  Review of Systems:  Per above, the remainder of the 10pt ros reviewed and are neg  Past Medical History  Diagnosis Date  . Diabetes mellitus   . Hypertension   . Hyperlipidemia   . Atrial fibrillation   . Sleep apnea   . Cholelithiasis   . CAD (coronary artery disease)     atrial fibrillation   Past Surgical History  Procedure Laterality Date  . Tonsillectomy  1970s  . Hemorroidectomy  1990s  . Choledochal cyst excision  1980s  . Parathyroidectomy    . Endovenous ablation saphenous vein w/ laser  08-02-2011  Right greater saphenous vein   Social History:  reports that she has never smoked. She has never used smokeless tobacco. She reports that she does not drink alcohol or use illicit  drugs.  where does patient live--home, ALF, SNF? and with whom if at home?  Can patient participate in ADLs?  No Known Allergies  Family History  Problem Relation Age of Onset  . Hypertension Mother   . Heart disease Father 26    MI  . COPD Father   . Other Brother     heart issues  . Heart disease Brother   . Other Brother     heart issues  . Heart disease Brother   . Drug abuse Brother     (be sure to complete)  Prior to Admission medications   Medication Sig Start Date End Date Taking? Authorizing Provider  ALPRAZolam Prudy Feeler) 0.5 MG tablet Take 1 tablet (0.5 mg total) by mouth 3 (three) times daily. 04/05/14  Yes Daniel J Angiulli, PA-C  bethanechol (URECHOLINE) 25 MG tablet Take 1 tablet (25 mg total) by mouth at bedtime. 04/05/14  Yes Daniel J Angiulli, PA-C  buPROPion (WELLBUTRIN XL) 300 MG 24 hr tablet Take 1 tablet (300 mg total) by mouth daily. 04/05/14  Yes Daniel J Angiulli, PA-C  furosemide (LASIX) 40 MG tablet Take 1 tablet (40 mg total) by mouth daily. 04/05/14  Yes Daniel J Angiulli, PA-C  HYDROcodone-acetaminophen (NORCO/VICODIN) 5-325 MG per tablet Take 1 tablet by mouth every 4 (four) hours as needed for moderate pain. 04/05/14  Yes Daniel J Angiulli, PA-C  insulin glargine (LANTUS) 100 UNIT/ML injection Inject 0.3 mLs (30 Units total) into the skin at bedtime. 04/05/14  Yes Daniel J Angiulli, PA-C  nystatin (MYCOSTATIN/NYSTOP) 100000 UNIT/GM  POWD Apply 1 g topically every evening.   Yes Historical Provider, MD  simvastatin (ZOCOR) 20 MG tablet Take 1 tablet (20 mg total) by mouth daily. 04/05/14  Yes Daniel J Angiulli, PA-C  sodium bicarbonate 650 MG tablet Take 1 tablet (650 mg total) by mouth 3 (three) times daily. 04/05/14  Yes Daniel J Angiulli, PA-C  warfarin (COUMADIN) 5 MG tablet Take 5 mg by mouth daily at 6 PM. Take as directed by coumadin clinic   Yes Historical Provider, MD   Physical Exam: Filed Vitals:   2014-05-13 0930 05/13/14 0945 May 13, 2014 1100 May 13, 2014 1103  BP:  118/62 131/71 130/62 130/62  Pulse: 87 87 89   Temp:      TempSrc:      Resp: 19 27 25 29   Height:      Weight:      SpO2: 94% 95% 98% 98%     General:  Awake, in nad  Eyes: PERRL B  ENT: membranes moist, dentition fair  Neck: trachea midline, neck supple  Cardiovascular: regular, s1, s2  Respiratory: normal resp effort, no wheezing  Abdomen: soft, nondistended  Skin: normal skin turgor, B venous stasis changes noted   Musculoskeletal: perfused, no clubbing, 2+ pitting edema through the UE B  Psychiatric: mood/affect normal // no auditory/visual hallucinations  Neurologic: cn2-12 grossly intact, strength/sensation intact  Labs on Admission:  Basic Metabolic Panel:  Recent Labs Lab 2014-05-13 0931  NA 139  K 3.5*  CL 98  CO2 26  GLUCOSE 138*  BUN 21  CREATININE 1.10  CALCIUM 8.6   Liver Function Tests:  Recent Labs Lab 05-13-2014 0931  AST 14  ALT 6  ALKPHOS 79  BILITOT 0.6  PROT 5.7*  ALBUMIN 2.4*   No results found for this basename: LIPASE, AMYLASE,  in the last 168 hours No results found for this basename: AMMONIA,  in the last 168 hours CBC:  Recent Labs Lab 05-13-2014 0931  WBC 7.6  NEUTROABS 6.0  HGB 10.5*  HCT 36.7  MCV 81.4  PLT 305   Cardiac Enzymes:  Recent Labs Lab 05/13/14 0931  TROPONINI <0.30    BNP (last 3 results)  Recent Labs  May 13, 2014 0931  PROBNP 9984.0*   CBG: No results found for this basename: GLUCAP,  in the last 168 hours  Radiological Exams on Admission: Dg Chest 2 View  May 13, 2014   CLINICAL DATA:  Shortness of breath, mean to RIGHT side, history hypertension, diabetes, atrial fibrillation, coronary artery disease  EXAM: CHEST  2 VIEW  COMPARISON:  03/16/2014  FINDINGS: Minimal enlargement of cardiac silhouette.  Pulmonary vascular congestion.  Small RIGHT pleural effusion.  New opacification of the RIGHT lower lobe compatible with pneumonia.  Associated volume loss in RIGHT hemi thorax.  Atelectasis  versus consolidation in retrocardiac LEFT lower lobe as well.  No pneumothorax.  Bones demineralized.  IMPRESSION: Enlargement of cardiac silhouette with pulmonary vascular congestion.  BILATERAL new lower lobe consolidation compatible with pneumonia.  Small RIGHT pleural effusion.   Electronically Signed   By: Ulyses Southward M.D.   On: 2014/05/13 10:29    Assessment/Plan Principal Problem:   Acute CHF (congestive heart failure) Active Problems:   Atrial fibrillation   Ulcers of both lower legs, chronic venous stasis   HCAP (healthcare-associated pneumonia)   Diabetes mellitus type 2 in obese   1. Acute CHF exacerbation 1. Elevated BNP of just under 10,000 2. Will cont on scheduled IV lasix 3. Cont strict i/o and daily wts  4. Will check TSH and serial cardiac enzymes 5. Will repeat 2d echo, last study in 2007 6. Admit to med-tele 2. Afib 1. Rate controlled 2. Cont coumadin per home regimen 3. Venous stasis 1. Appear stable 4. HCAP 1. New B lower lobe infiltrates on cxr 2. Will cont with cefepime and vanc 3. No leukocytosis 4. Afebrile 5. DM 1. Recent a1c of 7.2 in 6/15 2. Cont on home lantus with SSI coverage 6. DVT prophylaxis 1. Coumadin per above  Code Status: Full (must indicate code status--if unknown or must be presumed, indicate so) Family Communication: Pt in room (indicate person spoken with, if applicable, with phone number if by telephone) Disposition Plan: Pending (indicate anticipated LOS)  Time spent: 40min  CHIU, STEPHEN K [facility] Pager [phone]  If 7PM-7AM, please contact night-coverage [email or web address] Password [email or web address] 04/23/2014, 11:57 AM

## 2014-04-15 NOTE — Progress Notes (Signed)
Report given to receiving RN. Patient in bed resting. No verbal complaints and no signs or symptoms of distress or discomfort noted.  

## 2014-04-15 NOTE — Telephone Encounter (Signed)
°  Donnita with [facility] called in that she drew patients PT/INR on Saturday 04/13/14 and PT 31.9 and INR 3.09. Please call and advise.

## 2014-04-15 NOTE — Telephone Encounter (Signed)
Patient actually showed up at the ER this morning with edema and SOB.  [facility] notified of this.

## 2014-04-15 NOTE — Progress Notes (Signed)
  Echocardiogram 2D Echocardiogram has been performed.  Arvil ChacoFoster, Mohannad Olivero 04/20/2014, 3:11 PM

## 2014-04-15 NOTE — ED Notes (Signed)
Discharged from hospital 7/4 and since then increases swelling all extremities, general fatigue, and shortness of breath at rest at night.

## 2014-04-15 NOTE — Progress Notes (Signed)
ANTICOAGULATION/ANTIBIOTIC CONSULT NOTE - Initial Consult  Pharmacy Consult for Vancomycin and Cefepime and Warfarin Indication: HCAP and afib  No Known Allergies  Patient Measurements: Height: 5\' 5"  (165.1 cm) Weight: 250 lb (113.399 kg) IBW/kg (Calculated) : 57  Vital Signs: Temp: 98 F (36.7 C) (07/13 0856) Temp src: Oral (07/13 0856) BP: 118/74 mmHg (07/13 1200) Pulse Rate: 73 (07/13 1200)  Labs:  Recent Labs  04/04/2014 0931  HGB 10.5*  HCT 36.7  PLT 305  LABPROT 41.1*  INR 4.28*  CREATININE 1.10  TROPONINI <0.30    Estimated Creatinine Clearance: 67.5 ml/min (by C-G formula based on Cr of 1.1). No results found for this basename: VANCOTROUGH, Leodis Binet, VANCORANDOM, GENTTROUGH, GENTPEAK, GENTRANDOM, TOBRATROUGH, TOBRAPEAK, TOBRARND, AMIKACINPEAK, AMIKACINTROU, AMIKACIN,  in the last 72 hours   Microbiology: Recent Results (from the past 720 hour(s))  CULTURE, BLOOD (ROUTINE X 2)     Status: None   Collection Time    03/16/14  1:35 PM      Result Value Ref Range Status   Specimen Description BLOOD LEFT HAND   Final   Special Requests BOTTLES DRAWN AEROBIC AND ANAEROBIC 10CC   Final   Culture  Setup Time     Final   Value: 03/16/2014 18:20     Performed at [facility]   Culture     Final   Value: NO GROWTH 5 DAYS     Performed at [facility]   Report Status 03/22/2014 FINAL   Final  CULTURE, BLOOD (ROUTINE X 2)     Status: None   Collection Time    03/16/14  1:45 PM      Result Value Ref Range Status   Specimen Description BLOOD LEFT ARM   Final   Special Requests BOTTLES DRAWN AEROBIC AND ANAEROBIC 10CC   Final   Culture  Setup Time     Final   Value: 03/16/2014 18:20     Performed at [facility]   Culture     Final   Value: NO GROWTH 5 DAYS     Performed at [facility]   Report Status 03/22/2014 FINAL   Final  MRSA PCR SCREENING     Status: Abnormal   Collection Time    03/16/14  3:20 PM      Result Value Ref  Range Status   MRSA by PCR POSITIVE (*) NEGATIVE Final   Comment:            The GeneXpert MRSA Assay (FDA     approved for NASAL specimens     only), is one component of a     comprehensive MRSA colonization     surveillance program. It is not     intended to diagnose MRSA     infection nor to guide or     monitor treatment for     MRSA infections.     RESULT CALLED TO, READ BACK BY AND VERIFIED WITH:     A.[email or web address],RN [id number] 03/16/14 M.CAMPBELL  WOUND CULTURE     Status: None   Collection Time    03/16/14  3:48 PM      Result Value Ref Range Status   Specimen Description WOUND   Final   Special Requests RIGHT LEG   Final   Gram Stain     Final   Value: RARE WCP RARE SQUAMOUS EPITHELIAL CELLS PRESENT     NO ORGANISMS SEEN     Performed at [facility]  Culture     Final   Value: MODERATE STAPHYLOCOCCUS AUREUS     Note: SUSCEPTIBILITIES PERFORMED ON PREVIOUS CULTURE WITHIN THE LAST 5 DAYS.     Performed at [facility]   Report Status 03/20/2014 FINAL   Final  WOUND CULTURE     Status: None   Collection Time    03/16/14  5:08 PM      Result Value Ref Range Status   Specimen Description WOUND   Final   Special Requests LEFT LEG   Final   Gram Stain     Final   Value: FEW WBC PRESENT, PREDOMINANTLY PMN     RARE SQUAMOUS EPITHELIAL CELLS PRESENT     FEW GRAM POSITIVE COCCI IN PAIRS     FEW GRAM NEGATIVE COCCI     RARE GRAM NEGATIVE RODS   Culture     Final   Value: MODERATE STAPHYLOCOCCUS AUREUS     Note: RIFAMPIN AND GENTAMICIN SHOULD NOT BE USED AS SINGLE DRUGS FOR TREATMENT OF STAPH INFECTIONS.     Performed at [facility]   Report Status 03/20/2014 FINAL   Final   Organism ID, Bacteria STAPHYLOCOCCUS AUREUS   Final  MRSA PCR SCREENING     Status: None   Collection Time    03/22/14  1:31 PM      Result Value Ref Range Status   MRSA by PCR NEGATIVE  NEGATIVE Final   Comment:            The GeneXpert MRSA Assay (FDA     approved for NASAL  specimens     only), is one component of a     comprehensive MRSA colonization     surveillance program. It is not     intended to diagnose MRSA     infection nor to guide or     monitor treatment for     MRSA infections.     DELTA CHECK NOTED  URINE CULTURE     Status: None   Collection Time    04/02/14  5:18 PM      Result Value Ref Range Status   Specimen Description URINE, CLEAN CATCH   Final   Special Requests NONE   Final   Culture  Setup Time     Final   Value: 04/02/2014 19:21     Performed at [facility] Count     Final   Value: 70,000 COLONIES/ML     Performed at [facility]   Culture     Final   Value: Multiple bacterial morphotypes present, none predominant. Suggest appropriate recollection if clinically indicated.     Performed at [facility]   Report Status 04/03/2014 FINAL   Final    Medical History: Past Medical History  Diagnosis Date  . Diabetes mellitus   . Hypertension   . Hyperlipidemia   . Atrial fibrillation   . Sleep apnea   . Cholelithiasis   . CAD (coronary artery disease)     atrial fibrillation    Medications:  See electronic med rec  Assessment: 63 y.o. female presents with SOB, swelling. Pt recently discharged from inpt rehab on 04/06/14.  AC: Pt on coumadin PTA for afib. Admit INR 4.28 (supratherapeutic). Hgb stable. No bleeding noted.  ID: Pt to continue Vanc and Cefepime for HCAP. Home dodse 5mg  daily - last dose on 7/12. Afeb. Wbc wnl. Vanc 1.5gm IV given in ED 1120. Cefepime not given yet.  7/13 Vanc>> 7/13 Cefepime>>  Bld x2>> 7/13 Urine>> Sputum>>  Goal of Therapy:  Vancomycin trough level 15-20 mcg/ml; INR 2-3  Plan:  1. Hold coumadin tonight 2. Daily INR 3. Vancomycin 500mg  IV now - extra dose to equal 2gm load then start Vancomycin 1gm IV q12h. 4. Continue Cefepime 1gm IV q8h. 5. F/u renal function, pt's clinical condition, vanc trough at C[facility]  Christoper Fabianaron Denitra Donaghey, PharmD,  BCPS Clinical pharmacist, pager [phone] Feb 21, 2014,12:47 PM

## 2014-04-15 NOTE — ED Provider Notes (Signed)
CSN: [id number]     Arrival date & time 04/29/2014  1478 History   First MD Initiated Contact with Patient 04/16/2014 [phone]     Chief Complaint  Patient presents with  . Fatigue  . Leg Swelling     (Consider location/radiation/quality/duration/timing/severity/associated sxs/prior Treatment) Patient is a 63 y.o. female presenting with shortness of breath. The history is provided by the patient.  Shortness of Breath Severity:  Mild Onset quality:  Gradual Duration:  4 days Timing:  Constant Progression:  Worsening Chronicity:  New Context comment:  W/ activity and now at rest Relieved by:  Nothing Worsened by:  Nothing tried Ineffective treatments:  None tried Associated symptoms: cough and wheezing   Associated symptoms: no abdominal pain, no chest pain, no fever, no headaches, no neck pain and no vomiting   Cough:    Cough characteristics:  Non-productive Wheezing:    Severity:  Mild   Timing:  Rare   Past Medical History  Diagnosis Date  . Diabetes mellitus   . Hypertension   . Hyperlipidemia   . Atrial fibrillation   . Sleep apnea   . Cholelithiasis   . CAD (coronary artery disease)     atrial fibrillation   Past Surgical History  Procedure Laterality Date  . Tonsillectomy  1970s  . Hemorroidectomy  1990s  . Choledochal cyst excision  1980s  . Parathyroidectomy    . Endovenous ablation saphenous vein w/ laser  08-02-2011  Right greater saphenous vein   Family History  Problem Relation Age of Onset  . Hypertension Mother   . Heart disease Father 17    MI  . COPD Father   . Other Brother     heart issues  . Heart disease Brother   . Other Brother     heart issues  . Heart disease Brother   . Drug abuse Brother    History  Substance Use Topics  . Smoking status: Never Smoker   . Smokeless tobacco: Never Used  . Alcohol Use: No   OB History   Grav Para Term Preterm Abortions TAB SAB Ect Mult Living                 Review of Systems   Constitutional: Negative for fever and fatigue.  HENT: Negative for congestion and drooling.   Eyes: Negative for pain.  Respiratory: Positive for cough, shortness of breath and wheezing.   Cardiovascular: Negative for chest pain.  Gastrointestinal: Negative for nausea, vomiting, abdominal pain and diarrhea.  Genitourinary: Negative for dysuria and hematuria.  Musculoskeletal: Negative for back pain, gait problem and neck pain.       Edema   Skin: Negative for color change.  Neurological: Negative for dizziness and headaches.  Hematological: Negative for adenopathy.  Psychiatric/Behavioral: Negative for behavioral problems.  All other systems reviewed and are negative.     Allergies  Review of patient's allergies indicates no known allergies.  Home Medications   Prior to Admission medications   Medication Sig Start Date End Date Taking? Authorizing Provider  albuterol (PROVENTIL) (2.5 MG/3ML) 0.083% nebulizer solution Take 3 mLs (2.5 mg total) by nebulization every 6 (six) hours as needed for wheezing or shortness of breath. 03/22/14   Ripudeep Jenna Luo, MD  ALPRAZolam Prudy Feeler) 0.5 MG tablet Take 1 tablet (0.5 mg total) by mouth 3 (three) times daily. 04/05/14   Mcarthur Rossetti Angiulli, PA-C  bethanechol (URECHOLINE) 25 MG tablet Take 1 tablet (25 mg total) by mouth at bedtime. 04/05/14  Mcarthur Rossettianiel J Angiulli, PA-C  buPROPion (WELLBUTRIN XL) 300 MG 24 hr tablet Take 1 tablet (300 mg total) by mouth daily. 04/05/14   Mcarthur Rossettianiel J Angiulli, PA-C  furosemide (LASIX) 40 MG tablet Take 1 tablet (40 mg total) by mouth daily. 04/05/14   Mcarthur Rossettianiel J Angiulli, PA-C  HYDROcodone-acetaminophen (NORCO/VICODIN) 5-325 MG per tablet Take 1 tablet by mouth every 4 (four) hours as needed for moderate pain. 04/05/14   Mcarthur Rossettianiel J Angiulli, PA-C  insulin glargine (LANTUS) 100 UNIT/ML injection Inject 0.3 mLs (30 Units total) into the skin at bedtime. 04/05/14   Mcarthur Rossettianiel J Angiulli, PA-C  nystatin (MYCOSTATIN/NYSTOP) 100000 UNIT/GM POWD  Apply 1 Bottle topically 2 (two) times daily. 04/05/14   Mcarthur Rossettianiel J Angiulli, PA-C  simvastatin (ZOCOR) 20 MG tablet Take 1 tablet (20 mg total) by mouth daily. 04/05/14   Mcarthur Rossettianiel J Angiulli, PA-C  sodium bicarbonate 650 MG tablet Take 1 tablet (650 mg total) by mouth 3 (three) times daily. 04/05/14   Mcarthur Rossettianiel J Angiulli, PA-C  warfarin (COUMADIN) 5 MG tablet Take as directed by coumadin clinic    Historical Provider, MD   BP 133/71  Pulse 90  Temp(Src) 98 F (36.7 C) (Oral)  Resp 18  Ht 5\' 5"  (1.651 m)  Wt 250 lb (113.399 kg)  BMI 41.60 kg/m2  SpO2 97% Physical Exam  Nursing note and vitals reviewed. Constitutional: She is oriented to person, place, and time. She appears well-developed and well-nourished.  HENT:  Head: Normocephalic and atraumatic.  Mouth/Throat: Oropharynx is clear and moist. No oropharyngeal exudate.  Eyes: Conjunctivae and EOM are normal. Pupils are equal, round, and reactive to light.  Neck: Normal range of motion. Neck supple.  Cardiovascular: Normal rate, regular rhythm, normal heart sounds and intact distal pulses.  Exam reveals no gallop and no friction rub.   No murmur heard. Pulmonary/Chest: Effort normal and breath sounds normal. No respiratory distress. She has no wheezes.  Diminished breath sounds in the lung bases.  Abdominal: Soft. Bowel sounds are normal. There is no tenderness. There is no rebound and no guarding.  Large central abdominal wall hernia.  Musculoskeletal: Normal range of motion. She exhibits edema. She exhibits no tenderness.  Moderate pitting edema of all extremities. Extending to the sacrum and the lower extremities. Extending to the upper arm in the upper extremities.  Stage I sacral ulcer on the left upper gluteal cleft.  Mild erythema and old-appearing abrasions to bilateral lower extremities after removal of bandages.  Neurological: She is alert and oriented to person, place, and time.  Skin: Skin is warm and dry.  Psychiatric: She has a  normal mood and affect. Her behavior is normal.    ED Course  Procedures (including critical care time) Labs Review Labs Reviewed  URINALYSIS, ROUTINE W REFLEX MICROSCOPIC - Abnormal; Notable for the following:    Color, Urine AMBER (*)    APPearance CLOUDY (*)    Hgb urine dipstick LARGE (*)    Bilirubin Urine MODERATE (*)    Ketones, ur 15 (*)    Protein, ur 100 (*)    Leukocytes, UA MODERATE (*)    All other components within normal limits  CBC WITH DIFFERENTIAL - Abnormal; Notable for the following:    Hemoglobin 10.5 (*)    MCH 23.3 (*)    MCHC 28.6 (*)    RDW 24.1 (*)    Neutrophils Relative % 80 (*)    Lymphocytes Relative 9 (*)    All other components within normal limits  COMPREHENSIVE METABOLIC PANEL - Abnormal; Notable for the following:    Potassium 3.5 (*)    Glucose, Bld 138 (*)    Total Protein 5.7 (*)    Albumin 2.4 (*)    GFR calc non Af Amer 53 (*)    GFR calc Af Amer 61 (*)    All other components within normal limits  PROTIME-INR - Abnormal; Notable for the following:    Prothrombin Time 41.1 (*)    INR 4.28 (*)    All other components within normal limits  PRO B NATRIURETIC PEPTIDE - Abnormal; Notable for the following:    Pro B Natriuretic peptide (BNP) 9984.0 (*)    All other components within normal limits  URINE MICROSCOPIC-ADD ON - Abnormal; Notable for the following:    Squamous Epithelial / LPF FEW (*)    Bacteria, UA MANY (*)    All other components within normal limits  URINE CULTURE  CULTURE, BLOOD (ROUTINE X 2)  CULTURE, BLOOD (ROUTINE X 2)  CULTURE, EXPECTORATED SPUTUM-ASSESSMENT  GRAM STAIN  TROPONIN I  TROPONIN I  GLUCOSE, CAPILLARY  TROPONIN I  HIV ANTIBODY (ROUTINE TESTING)  LEGIONELLA ANTIGEN, URINE  STREP PNEUMONIAE URINARY ANTIGEN  TROPONIN I  COMPREHENSIVE METABOLIC PANEL  CBC  TSH  LIPID PANEL  PROTIME-INR    Imaging Review Dg Chest 2 View  04/14/2014   CLINICAL DATA:  Shortness of breath, mean to RIGHT side,  history hypertension, diabetes, atrial fibrillation, coronary artery disease  EXAM: CHEST  2 VIEW  COMPARISON:  03/16/2014  FINDINGS: Minimal enlargement of cardiac silhouette.  Pulmonary vascular congestion.  Small RIGHT pleural effusion.  New opacification of the RIGHT lower lobe compatible with pneumonia.  Associated volume loss in RIGHT hemi thorax.  Atelectasis versus consolidation in retrocardiac LEFT lower lobe as well.  No pneumothorax.  Bones demineralized.  IMPRESSION: Enlargement of cardiac silhouette with pulmonary vascular congestion.  BILATERAL new lower lobe consolidation compatible with pneumonia.  Small RIGHT pleural effusion.   Electronically Signed   By: Ulyses Southward M.D.   On: 04/10/2014 10:29     EKG Interpretation   Date/Time:  Monday April 15 2014 09:15:03 EDT Ventricular Rate:  84 PR Interval:    QRS Duration: 85 QT Interval:  392 QTC Calculation: 463 R Axis:   89 Text Interpretation:  Atrial fibrillation Ventricular premature complex  Borderline right axis deviation Low voltage, extremity and precordial  leads Nonspecific T abnormalities, lateral leads Confirmed by Perel Hauschild   MD, Jayzen Paver ([id number]) on 04/28/2014 9:25:38 AM      MDM   Final diagnoses:  HCAP (healthcare-associated pneumonia)  SOB (shortness of breath)  Anasarca  UTI (lower urinary tract infection)  Hematuria  Elevated brain natriuretic peptide (BNP) level    9:20 AM 63 y.o. female w hx of DM, HTN, CAD, afib on coumadin, s/p recent admission for AKI, electrolyte abn's and rehab who presents with gradual worsening of shortness of breath over the last 4 days. She also notes hearing and occasional wheeze. She has had a mild nonproductive cough. She notes worsening pitting edema in her extremities. Also orthopnea. She denies any cp or fever. She is afebrile, hypoxic on RA w/ O2 sat showing 86%. VS otherwise unremarkable. Will start with screening labs and imaging.  Found to have bilateral lower lobe pna.  Will cover for HCAP. Also possible UTI.     Junius Argyle, MD 04/07/2014 1736

## 2014-04-15 NOTE — ED Notes (Signed)
EDP at bedside  

## 2014-04-16 DIAGNOSIS — R609 Edema, unspecified: Secondary | ICD-10-CM

## 2014-04-16 DIAGNOSIS — I509 Heart failure, unspecified: Secondary | ICD-10-CM

## 2014-04-16 DIAGNOSIS — J9600 Acute respiratory failure, unspecified whether with hypoxia or hypercapnia: Secondary | ICD-10-CM

## 2014-04-16 DIAGNOSIS — I872 Venous insufficiency (chronic) (peripheral): Secondary | ICD-10-CM

## 2014-04-16 DIAGNOSIS — E119 Type 2 diabetes mellitus without complications: Secondary | ICD-10-CM

## 2014-04-16 DIAGNOSIS — J9601 Acute respiratory failure with hypoxia: Secondary | ICD-10-CM | POA: Diagnosis present

## 2014-04-16 DIAGNOSIS — E669 Obesity, unspecified: Secondary | ICD-10-CM

## 2014-04-16 LAB — GLUCOSE, CAPILLARY
Glucose-Capillary: 111 mg/dL — ABNORMAL HIGH (ref 70–99)
Glucose-Capillary: 129 mg/dL — ABNORMAL HIGH (ref 70–99)
Glucose-Capillary: 104 mg/dL — ABNORMAL HIGH (ref 70–99)
Glucose-Capillary: 116 mg/dL — ABNORMAL HIGH (ref 70–99)

## 2014-04-16 LAB — COMPREHENSIVE METABOLIC PANEL
ALBUMIN: 2.4 g/dL — AB (ref 3.5–5.2)
ALT: 6 U/L (ref 0–35)
AST: 12 U/L (ref 0–37)
Alkaline Phosphatase: 75 U/L (ref 39–117)
Anion gap: 13 (ref 5–15)
BUN: 20 mg/dL (ref 6–23)
CO2: 27 meq/L (ref 19–32)
Calcium: 8.2 mg/dL — ABNORMAL LOW (ref 8.4–10.5)
Chloride: 100 mEq/L (ref 96–112)
Creatinine, Ser: 1.11 mg/dL — ABNORMAL HIGH (ref 0.50–1.10)
GFR calc Af Amer: 61 mL/min — ABNORMAL LOW (ref >90)
GFR calc non Af Amer: 52 mL/min — ABNORMAL LOW (ref >90)
Glucose, Bld: 117 mg/dL — ABNORMAL HIGH (ref 70–99)
Potassium: 3.6 mEq/L — ABNORMAL LOW (ref 3.7–5.3)
SODIUM: 140 meq/L (ref 137–147)
Total Bilirubin: 0.7 mg/dL (ref 0.3–1.2)
Total Protein: 5.6 g/dL — ABNORMAL LOW (ref 6.0–8.3)

## 2014-04-16 LAB — CBC
HEMATOCRIT: 34.3 % — AB (ref 36.0–46.0)
HEMOGLOBIN: 9.8 g/dL — AB (ref 12.0–15.0)
MCH: 23 pg — AB (ref 26.0–34.0)
MCHC: 28.6 g/dL — ABNORMAL LOW (ref 30.0–36.0)
MCV: 80.5 fL (ref 78.0–100.0)
Platelets: 306 10*3/uL (ref 150–400)
RBC: 4.26 MIL/uL (ref 3.87–5.11)
RDW: 23.6 % — ABNORMAL HIGH (ref 11.5–15.5)
WBC: 9.4 10*3/uL (ref 4.0–10.5)

## 2014-04-16 LAB — URINE CULTURE

## 2014-04-16 LAB — PROTIME-INR
INR: 5.19 — AB (ref 0.00–1.49)
Prothrombin Time: 47.3 seconds — ABNORMAL HIGH (ref 11.6–15.2)

## 2014-04-16 LAB — LEGIONELLA ANTIGEN, URINE: LEGIONELLA ANTIGEN, URINE: NEGATIVE

## 2014-04-16 LAB — TSH: TSH: 7.08 u[IU]/mL — ABNORMAL HIGH (ref 0.350–4.500)

## 2014-04-16 LAB — LIPID PANEL
CHOL/HDL RATIO: 2.7 ratio
Cholesterol: 96 mg/dL (ref 0–200)
HDL: 36 mg/dL — ABNORMAL LOW (ref >39)
LDL Cholesterol: 38 mg/dL (ref 0–99)
TRIGLYCERIDES: 108 mg/dL (ref <150)
VLDL: 22 mg/dL (ref 0–40)

## 2014-04-16 LAB — PROTEIN / CREATININE RATIO, URINE
Creatinine, Urine: 42.08 mg/dL
PROTEIN CREATININE RATIO: 0.29 — AB (ref 0.00–0.15)
TOTAL PROTEIN, URINE: 12.4 mg/dL

## 2014-04-16 LAB — HEMOGLOBIN A1C
Hgb A1c MFr Bld: 5.5 % (ref <5.7)
Mean Plasma Glucose: 111 mg/dL (ref <117)

## 2014-04-16 LAB — TROPONIN I: Troponin I: <0.3 ng/mL (ref <0.30)

## 2014-04-16 MED ORDER — METOLAZONE 2.5 MG PO TABS
2.5000 mg | ORAL_TABLET | Freq: Every day | ORAL | Status: DC
  Administered 2014-04-16: 2.5 mg via ORAL
  Filled 2014-04-16: qty 1

## 2014-04-16 MED ORDER — METOPROLOL TARTRATE 12.5 MG HALF TABLET
12.5000 mg | ORAL_TABLET | Freq: Two times a day (BID) | ORAL | Status: DC
  Administered 2014-04-20 – 2014-04-21 (×2): 12.5 mg via ORAL
  Filled 2014-04-16 (×6): qty 1

## 2014-04-16 MED ORDER — FLUCONAZOLE 100 MG PO TABS
100.0000 mg | ORAL_TABLET | Freq: Every day | ORAL | Status: DC
  Administered 2014-04-16 – 2014-04-23 (×8): 100 mg via ORAL
  Filled 2014-04-16 (×10): qty 1

## 2014-04-16 MED ORDER — METOLAZONE 2.5 MG PO TABS
2.5000 mg | ORAL_TABLET | Freq: Two times a day (BID) | ORAL | Status: DC
  Administered 2014-04-16 – 2014-04-17 (×2): 2.5 mg via ORAL
  Filled 2014-04-16 (×3): qty 1

## 2014-04-16 MED ORDER — METOPROLOL TARTRATE 12.5 MG HALF TABLET: 12.5000 mg | ORAL_TABLET | Freq: Two times a day (BID) | ORAL | Status: DC

## 2014-04-16 MED ORDER — FUROSEMIDE 10 MG/ML IJ SOLN
20.0000 mg/h | INTRAVENOUS | Status: DC
  Administered 2014-04-16 – 2014-04-17 (×3): 10 mg/h via INTRAVENOUS
  Administered 2014-04-17: 15 mg/h via INTRAVENOUS
  Administered 2014-04-18 – 2014-04-24 (×6): 10 mg/h via INTRAVENOUS
  Administered 2014-04-25 – 2014-04-27 (×3): 12 mg/h via INTRAVENOUS
  Administered 2014-04-28 – 2014-04-30 (×4): 20 mg/h via INTRAVENOUS
  Filled 2014-04-16 (×36): qty 25

## 2014-04-16 MED ORDER — FUROSEMIDE 10 MG/ML IJ SOLN: 40.0000 mg | Freq: Every day | INTRAMUSCULAR | Status: DC

## 2014-04-16 NOTE — Progress Notes (Signed)
PT Cancellation Note  Patient Details Name: Jewel BaizeJudith M Asfour MRN: [id number] DOB: 06/17/1951   Cancelled Treatment:    Reason Eval/Treat Not Completed: Other (comment) (order states to start 04/18/14). Will follow.    Ralene BatheUhlenberg, Angel Hobdy Kistler 04/16/2014, 12:00 PM [phone]

## 2014-04-16 NOTE — Progress Notes (Signed)
Report given to receiving RN. Patient sleeping. No signs or symptoms of distress or discomfort noted.  

## 2014-04-16 NOTE — Progress Notes (Signed)
INR high, coumadin being managed by pharmacy.

## 2014-04-16 NOTE — Progress Notes (Signed)
CRITICAL VALUE ALERT  Critical value received:  INR 5.19  Date of notification:  04/16/2014  Time of notification:  0454  Critical value read back:Yes.    Nurse who received alert:  Lawana ChambersHannah Joene Gelder, RN  MD notified (1st page):  Schorr  Time of first page:  [phone]  MD notified (2nd page):  Time of second page:  Responding MD:  ---no response  Time MD responded:  N/A

## 2014-04-16 NOTE — Progress Notes (Signed)
ANTICOAGULATION CONSULT NOTE - Follow Up Consult  Pharmacy Consult:  Coumadin Indication: atrial fibrillation  No Known Allergies  Patient Measurements: Height: 5\' 6"  (167.6 cm) Weight: 298 lb 8 oz (135.399 kg) IBW/kg (Calculated) : 59.3  Vital Signs: Temp: 98.1 F (36.7 C) (07/14 0347) Temp src: Oral (07/14 0347) BP: 120/62 mmHg (07/14 0347) Pulse Rate: 88 (07/14 0347)  Labs:  Recent Labs  04/25/2014 0931 04/13/2014 1400 04/05/2014 2100 04/16/14 0342 04/16/14 0343  HGB 10.5*  --   --  9.8*  --   HCT 36.7  --   --  34.3*  --   PLT 305  --   --  306  --   LABPROT 41.1*  --   --  47.3*  --   INR 4.28*  --   --  5.19*  --   CREATININE 1.10  --   --  1.11*  --   TROPONINI <0.30 <0.30 <0.30  --  <0.30    Estimated Creatinine Clearance: 75.4 ml/min (by C-G formula based on Cr of 1.11).      Assessment: [age] YOF admitted with CHF exacerbation.  Pharmacy consulted to manage Coumadin for history of Afib.  INR supra-therapeutic on admit and trended up further.  No bleeding reported.   Goal of Therapy:  INR 2-3   Plan:  - No Coumadin today - Daily PT / INR - F/U KCL supplementation    Aidynn Polendo D. Laney Potashang, PharmD, BCPS Pager:  [phone] 04/16/2014, 8:39 AM

## 2014-04-16 NOTE — Progress Notes (Signed)
Orthopedic Tech Progress Note Patient Details:  Jewel BaizeJudith M Degan 09/04/1951 [id number]  Ortho Devices Type of Ortho Device: Roland RackUnna boot Ortho Device/Splint Location: Bilateral Ortho Device/Splint Interventions: Application   Asia Burnett KanarisR Thompson 04/16/2014, 12:54 PM

## 2014-04-16 NOTE — Care Management Note (Addendum)
  Page 2 of 2   04/19/2014     10:34:40 AM CARE MANAGEMENT NOTE 04/19/2014  Patient:  Robin [name],Robin [name]   Account Number:  [id number]  Date Initiated:  04/16/2014  Documentation initiated by:  Donato SchultzHUTCHINSON,Clea Dubach  Subjective/Objective Assessment:   CHF     Action/Plan:   CM to follow for dispositon needs   Anticipated DC Date:  04/19/2014   Anticipated DC Plan:  HOME W HOME HEALTH SERVICES  In-house referral  Clinical Social Worker      DC Associate Professorlanning Services  CM consult      [facility]   Choice offered to / List presented to:  NA        HH arranged  HH-1 RN      [facility]   Status of service:  Completed, signed off Medicare Important Message given?  YES (If response is "NO", the following Medicare IM given date fields will be blank) Date Medicare IM given:  04/18/2014 Medicare IM given by:  Briley Sulton Date Additional Medicare IM given:   Additional Medicare IM given by:    Discharge Disposition:  SKILLED NURSING FACILITY  Per UR Regulation:  Reviewed for med. necessity/level of care/duration of stay  If discussed at Long Length of Stay Meetings, dates discussed:    Comments:  Damyra Luscher RN, BSN, MSHL, CCM  Nurse - Case Manager,  ([facility] [id number])  [phone]  04/19/2014 Social:  From home with HHS ([facility]) RN, PT, OT PT RECS:  SNF (SW referral) Dispositon:  SNF at this time if patient receptive to recommendation

## 2014-04-16 NOTE — Consult Note (Addendum)
WOC wound consult note Reason for Consult: Consult requested for bilat buttocks, abd skin folds, and legs.  Pt is followed prior to admission for home health assistance with Una boots, Interdry, and foam dressings. Wound type:Bilat buttocks with stage 2 wounds.  Left buttock near gluteal cleft .5X.5X.1cm, 100% red and moist, bleeds easily.  Right buttock with 1X1X.1cm, 100% pink and moist.  Both sites with minimal yellow drainage and no odor. Pressure Ulcer POA: Yes Right knee with partial thickness abrasion from fall prior to admission.  2X2X.1cm dry red scabbed area without odor or drainage.  Bilat lower legs with generalized edema and multiple areas of partial thickness wounds which are pink and moist.  Mod amt yellow drainage weeping from both calves, no odor.  Pt wears Una boots prior to admission and they are changed Q week to control edema. Skin folds to abd and groin are red and moist with mod amt yellow drainage and strong odor.  Appearance consistent with Intertrigo.  Home health applied Interdry silver-impregnated fabric to affected areas yesterday, according to patient. This topical treatment will provide antimicrobial benefits and wick moisture away from skin. Dressing procedure/placement/frequency: Ortho tech notified to apply Una boots and Coban to BLE and change Q Tuesday.  Pt can resume follow-up with home health after discharge.  Continue present plan of care to skin folds with Interdry; optimal plan of care is to leave in place for 5 days.  Foam dressing to buttocks and right knee to protect and promote healing. Please re-consult if further assistance is needed.  Thank-you,  Cammie Mcgeeawn Jermani Pund MSN, RN, CWOCN, [place]CN-AP, CNS [phone]

## 2014-04-16 NOTE — Progress Notes (Addendum)
[facility] PROGRESS NOTE  Filed Weights   04/26/2014 0856 04/14/2014 1311 04/16/14 0347  Weight: 113.399 kg (250 lb) 134.174 kg (295 lb 12.8 oz) 135.399 kg (298 lb 8 oz)        Intake/Output Summary (Last 24 hours) at 04/16/14 0736 Last data filed at 04/16/14 0300  Gross per 24 hour  Intake    340 ml  Output   1200 ml  Net   -860 ml     Assessment/Plan: Acute respiratory failure with hypoxia/Acute CHF (congestive heart failure) - Weight on admission 134-135 kg, 1 month ago was ~ 111 kg. Check protein to cr ratio, pt has anasarca. - Start lasix drip low dose metolazone, if pt does not diuresis will need pressors. - Monitor electrolytes. Limit fluid intake. - Hold antibiotics no cough, fevers or leukocytosis.  Diabetes mellitus type 2 in obese - Cont Lantus plus SSI. - check HbgA1c  Ulcers of both lower legs, chronic venous stasis - unna boots. - WOC  Atrial fibrillation: - Rate controlled, coumadin per pharmacy.   Code Status: full Family Communication: none  Disposition Plan: inpatinet   Consultants:  noen  Procedures: ECHO: pending  Antibiotics:  vanc and cefepime  HPI/Subjective: Relates still SOB  Objective: Filed Vitals:   04/22/2014 1200 05/01/2014 1311 05/01/2014 2006 04/16/14 0347  BP: 118/74 125/70 139/66 120/62  Pulse: 73 70 99 88  Temp:   98.5 F (36.9 C) 98.1 F (36.7 C)  TempSrc:   Oral Oral  Resp: 23 22 24 18   Height:  5\' 6"  (1.676 m)    Weight:  134.174 kg (295 lb 12.8 oz)  135.399 kg (298 lb 8 oz)  SpO2: 100% 94% 95% 94%     Exam: General: Alert, awake, oriented x3, in no acute distress. Morbidly obese HEENT: No bruits, no goiter.  Heart: Regular rate and rhythm, Anasarca Lungs: Good air movement, bilateral air movement.  Abdomen: Soft, nontender, nondistended, positive bowel sounds.  Neuro: Grossly intact, nonfocal.   Data Reviewed: Basic Metabolic Panel:  Recent Labs Lab 04/27/2014 0931 04/16/14 0342  NA 139 140    K 3.5* 3.6*  CL 98 100  CO2 26 27  GLUCOSE 138* 117*  BUN 21 20  CREATININE 1.10 1.11*  CALCIUM 8.6 8.2*   Liver Function Tests:  Recent Labs Lab 04/03/2014 0931 04/16/14 0342  AST 14 12  ALT 6 6  ALKPHOS 79 75  BILITOT 0.6 0.7  PROT 5.7* 5.6*  ALBUMIN 2.4* 2.4*   No results found for this basename: LIPASE, AMYLASE,  in the last 168 hours No results found for this basename: AMMONIA,  in the last 168 hours CBC:  Recent Labs Lab 04/28/2014 0931 04/16/14 0342  WBC 7.6 9.4  NEUTROABS 6.0  --   HGB 10.5* 9.8*  HCT 36.7 34.3*  MCV 81.4 80.5  PLT 305 306   Cardiac Enzymes:  Recent Labs Lab 04/11/2014 0931 04/29/2014 1400 04/09/2014 2100 04/16/14 0343  TROPONINI <0.30 <0.30 <0.30 <0.30   BNP (last 3 results)  Recent Labs  04/07/2014 0931  PROBNP 9984.0*   CBG:  Recent Labs Lab 04/04/2014 1631 04/05/2014 2127 04/16/14 0610  GLUCAP 97 140* 111*    No results found for this or any previous visit (from the past 240 hour(s)).   Studies: Dg Chest 2 View  04/25/2014   CLINICAL DATA:  Shortness of breath, mean to RIGHT side, history hypertension, diabetes, atrial fibrillation, coronary artery disease  EXAM: CHEST  2 VIEW  COMPARISON:  03/16/2014  FINDINGS: Minimal enlargement of cardiac silhouette.  Pulmonary vascular congestion.  Small RIGHT pleural effusion.  New opacification of the RIGHT lower lobe compatible with pneumonia.  Associated volume loss in RIGHT hemi thorax.  Atelectasis versus consolidation in retrocardiac LEFT lower lobe as well.  No pneumothorax.  Bones demineralized.  IMPRESSION: Enlargement of cardiac silhouette with pulmonary vascular congestion.  BILATERAL new lower lobe consolidation compatible with pneumonia.  Small RIGHT pleural effusion.   Electronically Signed   By: Ulyses Southward M.D.   On: 04/25/14 10:29    Scheduled Meds: . ALPRAZolam  0.5 mg Oral TID  . buPROPion  300 mg Oral Daily  . furosemide  40 mg Intravenous Daily  . insulin aspart   0-15 Units Subcutaneous TID WC  . insulin aspart  0-5 Units Subcutaneous QHS  . insulin glargine  30 Units Subcutaneous QHS  . simvastatin  20 mg Oral Daily  . sodium chloride  3 mL Intravenous Q12H  . Warfarin - Pharmacist Dosing Inpatient   Does not apply q1800   Continuous Infusions:    Marinda Elk  [facility] Pager [phone]  If 8PM-8AM, please contact night-coverage at [email or web address], password [facility] 04/16/2014, 7:36 AM  LOS: 1 day     **Disclaimer: This note may have been dictated with voice recognition software. Similar sounding words can inadvertently be transcribed and this note may contain transcription errors which may not have been corrected upon publication of note.**

## 2014-04-16 NOTE — Progress Notes (Signed)
[facility]  Patient Status: Active (receiving services up to time of hospitalization)  [facility] is providing the following services: RN, PT and OT  If patient discharges after hours, please call [phone].   Robin BourgeoisMarie [name] 04/16/2014, 9:13 AM

## 2014-04-17 LAB — BASIC METABOLIC PANEL
ANION GAP: 15 (ref 5–15)
BUN: 20 mg/dL (ref 6–23)
CHLORIDE: 97 meq/L (ref 96–112)
CO2: 29 meq/L (ref 19–32)
CREATININE: 1.05 mg/dL (ref 0.50–1.10)
Calcium: 8.2 mg/dL — ABNORMAL LOW (ref 8.4–10.5)
GFR calc Af Amer: 65 mL/min — ABNORMAL LOW (ref >90)
GFR calc non Af Amer: 56 mL/min — ABNORMAL LOW (ref >90)
Glucose, Bld: 97 mg/dL (ref 70–99)
POTASSIUM: 3 meq/L — AB (ref 3.7–5.3)
Sodium: 141 mEq/L (ref 137–147)

## 2014-04-17 LAB — PROTIME-INR
INR: 4.04 — ABNORMAL HIGH (ref 0.00–1.49)
Prothrombin Time: 39.3 seconds — ABNORMAL HIGH (ref 11.6–15.2)

## 2014-04-17 LAB — GLUCOSE, CAPILLARY
GLUCOSE-CAPILLARY: 64 mg/dL — AB (ref 70–99)
GLUCOSE-CAPILLARY: 100 mg/dL — AB (ref 70–99)
Glucose-Capillary: 87 mg/dL (ref 70–99)
Glucose-Capillary: 151 mg/dL — ABNORMAL HIGH (ref 70–99)
Glucose-Capillary: 144 mg/dL — ABNORMAL HIGH (ref 70–99)

## 2014-04-17 LAB — TSH: TSH: 4.75 u[IU]/mL — AB (ref 0.350–4.500)

## 2014-04-17 LAB — T4, FREE: FREE T4: 1.03 ng/dL (ref 0.80–1.80)

## 2014-04-17 MED ORDER — POTASSIUM CHLORIDE 10 MEQ/100ML IV SOLN
10.0000 meq | INTRAVENOUS | Status: AC
  Administered 2014-04-17 (×5): 10 meq via INTRAVENOUS
  Filled 2014-04-17 (×5): qty 100

## 2014-04-17 MED ORDER — INSULIN GLARGINE 100 UNIT/ML ~~LOC~~ SOLN
20.0000 [IU] | Freq: Every day | SUBCUTANEOUS | Status: DC
  Administered 2014-04-17 – 2014-04-28 (×12): 20 [IU] via SUBCUTANEOUS
  Filled 2014-04-17 (×14): qty 0.2

## 2014-04-17 MED ORDER — POTASSIUM CHLORIDE CRYS ER 20 MEQ PO TBCR
40.0000 meq | EXTENDED_RELEASE_TABLET | Freq: Two times a day (BID) | ORAL | Status: AC
  Administered 2014-04-17 (×2): 40 meq via ORAL
  Filled 2014-04-17 (×3): qty 2

## 2014-04-17 NOTE — Progress Notes (Signed)
Report given to receiving RN. Patient in bed resting. No signs or symptoms of distress or discomfort noted.  

## 2014-04-17 NOTE — Progress Notes (Signed)
Hypoglycemic Event  CBG: 64  Treatment: 15 GM carbohydrate snack  Symptoms: None  Follow-up CBG: Time:6:48 CBG Result:87  Possible Reasons for Event: Inadequate meal intake  Comments/MD notified:    Robin [name], Kelsha Older A  Remember to initiate Hypoglycemia Order Set & complete

## 2014-04-17 NOTE — Progress Notes (Signed)
[facility] PROGRESS NOTE  Filed Weights   04/14/2014 1311 04/16/14 0347 04/17/14 0618  Weight: 134.174 kg (295 lb 12.8 oz) 135.399 kg (298 lb 8 oz) 133.98 kg (295 lb 6 oz)        Intake/Output Summary (Last 24 hours) at 04/17/14 1036 Last data filed at 04/17/14 1610  Gross per 24 hour  Intake 1260.5 ml  Output   4075 ml  Net -2814.5 ml     Assessment/Plan: Acute respiratory failure with hypoxia/Acute CHF (congestive heart failure) - Weight on admission 134-135 kg, 1 month ago was ~ 111 kg. protein to cr ratio < 3 grms, pt has anasarca. - Cont lasix drip low dose metolazone now with good diuresis. - Monitor electrolytes. Limit fluid intake. - D/c antibiotics no cough, fevers or leukocytosis.  Diabetes mellitus type 2 in obese - Cont Lantus plus SSI. - check HbgA1c  Ulcers of both lower legs, chronic venous stasis - unna boots. - WOC  Atrial fibrillation: - Rate controlled, coumadin per pharmacy.   Code Status: full Family Communication: none  Disposition Plan: inpatinet   Consultants:  noen  Procedures: ECHO: pending  Antibiotics:  vanc and cefepime one dose.  HPI/Subjective: Relates still SOB  Objective: Filed Vitals:   04/17/14 0155 04/17/14 0202 04/17/14 0618 04/17/14 0949  BP:  115/59 107/53 128/46  Pulse:  81 74 86  Temp:  97.5 F (36.4 C) 97.6 F (36.4 C)   TempSrc:  Oral Oral   Resp:  22 24   Height:      Weight:   133.98 kg (295 lb 6 oz)   SpO2: 93% 91% 95%      Exam: General: Alert, awake, oriented x3, in no acute distress. Morbidly obese HEENT: No bruits, no goiter.  Heart: Regular rate and rhythm, Anasarca Lungs: Good air movement, bilateral air movement.  Abdomen: Soft, nontender, nondistended, positive bowel sounds.  Neuro: Grossly intact, nonfocal.   Data Reviewed: Basic Metabolic Panel:  Recent Labs Lab 04/29/2014 0931 04/16/14 0342  NA 139 140  K 3.5* 3.6*  CL 98 100  CO2 26 27  GLUCOSE 138* 117*  BUN  21 20  CREATININE 1.10 1.11*  CALCIUM 8.6 8.2*   Liver Function Tests:  Recent Labs Lab 04/11/2014 0931 04/16/14 0342  AST 14 12  ALT 6 6  ALKPHOS 79 75  BILITOT 0.6 0.7  PROT 5.7* 5.6*  ALBUMIN 2.4* 2.4*   No results found for this basename: LIPASE, AMYLASE,  in the last 168 hours No results found for this basename: AMMONIA,  in the last 168 hours CBC:  Recent Labs Lab 04/10/2014 0931 04/16/14 0342  WBC 7.6 9.4  NEUTROABS 6.0  --   HGB 10.5* 9.8*  HCT 36.7 34.3*  MCV 81.4 80.5  PLT 305 306   Cardiac Enzymes:  Recent Labs Lab 04/27/2014 0931 04/04/2014 1400 04/05/2014 2100 04/16/14 0343  TROPONINI <0.30 <0.30 <0.30 <0.30   BNP (last 3 results)  Recent Labs  04/19/2014 0931  PROBNP 9984.0*   CBG:  Recent Labs Lab 04/16/14 1101 04/16/14 1643 04/16/14 2146 04/17/14 0614 04/17/14 0648  GLUCAP 129* 104* 116* 64* 87    Recent Results (from the past 240 hour(s))  URINE CULTURE     Status: None   Collection Time    04/06/2014  9:06 AM      Result Value Ref Range Status   Specimen Description URINE, CATHETERIZED   Final   Special Requests NONE   Final  Culture  Setup Time     Final   Value: 04/03/2014 15:37     Performed at [facility]   Colony Count     Final   Value: 8,000 COLONIES/ML     [facility]formed at [facility]   Culture     Final   Value: INSIGNIFICANT GROWTH     Performed at [facility]   Report Status 04/16/2014 FINAL   Final  CULTURE, BLOOD (ROUTINE X 2)     Status: None   Collection Time    04/28/2014  1:50 PM      Result Value Ref Range Status   Specimen Description Blood   Final   Special Requests NONE   Final   Culture  Setup Time     Final   Value: 04/03/2014 19:23     Performed at [facility]   Culture     Final   Value:        BLOOD CULTURE RECEIVED NO GROWTH TO DATE CULTURE WILL BE HELD FOR 5 DAYS BEFORE ISSUING A FINAL NEGATIVE REPORT     Performed at [facility]   Report Status PENDING    Incomplete  CULTURE, BLOOD (ROUTINE X 2)     Status: None   Collection Time    04/07/2014  2:00 PM      Result Value Ref Range Status   Specimen Description Blood   Final   Special Requests NONE   Final   Culture  Setup Time     Final   Value: 05/03/2014 19:23     Performed at [facility]   Culture     Final   Value:        BLOOD CULTURE RECEIVED NO GROWTH TO DATE CULTURE WILL BE HELD FOR 5 DAYS BEFORE ISSUING A FINAL NEGATIVE REPORT     Performed at [facility]   Report Status PENDING   Incomplete     Studies: No results found.  Scheduled Meds: . ALPRAZolam  0.5 mg Oral TID  . buPROPion  300 mg Oral Daily  . fluconazole  100 mg Oral Daily  . insulin aspart  0-15 Units Subcutaneous TID WC  . insulin aspart  0-5 Units Subcutaneous QHS  . insulin glargine  20 Units Subcutaneous QHS  . metolazone  2.5 mg Oral BID  . [START ON 04/20/2014] metoprolol tartrate  12.5 mg Oral BID  . potassium chloride  10 mEq Intravenous Q1 Hr x 5  . potassium chloride  40 mEq Oral BID  . simvastatin  20 mg Oral Daily  . sodium chloride  3 mL Intravenous Q12H  . Warfarin - Pharmacist Dosing Inpatient   Does not apply q1800   Continuous Infusions: . furosemide (LASIX) infusion 15 mg/hr (04/17/14 0150)     Marinda ElkFELIZ ORTIZ, Jamariyah Johannsen  [facility] Pager [phone]  If 8PM-8AM, please contact night-coverage at [email or web address], password [email or web address] 04/17/2014, 10:36 AM  LOS: 2 days     **Disclaimer: This note may have been dictated with voice recognition software. Similar sounding words can inadvertently be transcribed and this note may contain transcription errors which may not have been corrected upon publication of note.**

## 2014-04-17 NOTE — Progress Notes (Signed)
ANTICOAGULATION CONSULT NOTE - Follow Up Consult  Pharmacy Consult:  Coumadin Indication: atrial fibrillation  No Known Allergies  Patient Measurements: Height: 5\' 6"  (167.6 cm) Weight: 295 lb 6 oz (133.98 kg) IBW/kg (Calculated) : 59.3  Vital Signs: Temp: 97.6 F (36.4 C) (07/15 0618) Temp src: Oral (07/15 0618) BP: 107/53 mmHg (07/15 0618) Pulse Rate: 74 (07/15 0618)  Labs:  Recent Labs  04/05/2014 0931 04/10/2014 1400 04/19/2014 2100 04/16/14 0342 04/16/14 0343 04/17/14 0542  HGB 10.5*  --   --  9.8*  --   --   HCT 36.7  --   --  34.3*  --   --   PLT 305  --   --  306  --   --   LABPROT 41.1*  --   --  47.3*  --  39.3*  INR 4.28*  --   --  5.19*  --  4.04*  CREATININE 1.10  --   --  1.11*  --   --   TROPONINI <0.30 <0.30 <0.30  --  <0.30  --     Estimated Creatinine Clearance: 74.9 ml/min (by C-G formula based on Cr of 1.11).      Assessment: [age] YOF admitted with CHF exacerbation.  Pharmacy consulted to manage Coumadin for history of Afib.  INR remains elevated but starting to trend down.  No bleeding reported.  Noted patient is started on Diflucan, which could increase the effect of Coumadin.   Goal of Therapy:  INR 2-3   Plan:  - Continue to hold Coumadin - Daily PT / INR - F/U insulin adjustment, thyroid work-up    Jeovany Huitron D. Laney Potashang, PharmD, BCPS Pager:  [phone] 04/17/2014, 8:50 AM

## 2014-04-18 DIAGNOSIS — N183 Chronic kidney disease, stage 3 unspecified: Secondary | ICD-10-CM | POA: Diagnosis present

## 2014-04-18 DIAGNOSIS — E0821 Diabetes mellitus due to underlying condition with diabetic nephropathy: Secondary | ICD-10-CM | POA: Diagnosis present

## 2014-04-18 LAB — BASIC METABOLIC PANEL
Anion gap: 12 (ref 5–15)
BUN: 23 mg/dL (ref 6–23)
CO2: 31 mEq/L (ref 19–32)
CREATININE: 1.28 mg/dL — AB (ref 0.50–1.10)
Calcium: 8.1 mg/dL — ABNORMAL LOW (ref 8.4–10.5)
Chloride: 99 mEq/L (ref 96–112)
GFR calc Af Amer: 51 mL/min — ABNORMAL LOW (ref >90)
GFR calc non Af Amer: 44 mL/min — ABNORMAL LOW (ref >90)
Glucose, Bld: 112 mg/dL — ABNORMAL HIGH (ref 70–99)
Potassium: 3.9 mEq/L (ref 3.7–5.3)
Sodium: 142 mEq/L (ref 137–147)

## 2014-04-18 LAB — GLUCOSE, CAPILLARY
GLUCOSE-CAPILLARY: 118 mg/dL — AB (ref 70–99)
GLUCOSE-CAPILLARY: 138 mg/dL — AB (ref 70–99)
Glucose-Capillary: 96 mg/dL (ref 70–99)
Glucose-Capillary: 143 mg/dL — ABNORMAL HIGH (ref 70–99)

## 2014-04-18 LAB — PROTIME-INR
INR: 3.16 — AB (ref 0.00–1.49)
Prothrombin Time: 32.4 seconds — ABNORMAL HIGH (ref 11.6–15.2)

## 2014-04-18 MED ORDER — WARFARIN SODIUM 2.5 MG PO TABS
2.5000 mg | ORAL_TABLET | Freq: Once | ORAL | Status: AC
  Administered 2014-04-18: 2.5 mg via ORAL
  Filled 2014-04-18: qty 1

## 2014-04-18 MED ORDER — DOCUSATE SODIUM 100 MG PO CAPS
200.0000 mg | ORAL_CAPSULE | Freq: Two times a day (BID) | ORAL | Status: DC
  Administered 2014-04-19 – 2014-04-28 (×18): 200 mg via ORAL
  Filled 2014-04-18 (×24): qty 2

## 2014-04-18 NOTE — Progress Notes (Signed)
Received request for prescreen for inpatient rehab. Pt is known to our rehab team as she was at inpatient rehab from 03-22-14 to 04-08-14. I have reviewed pt's case and made note of acute CHF.  Discussed pt's case with rehab team and rehab team feels that pt is not appropriate for further acute inpatient rehab at this time. Recommend pursuing skilled nursing facility verus home with home health support.  Please call me with any questions. Thanks.  Juliann MuleJanine Frankie Zito, PT Rehabilitation Admissions Coordinator [phone]

## 2014-04-18 NOTE — Progress Notes (Signed)
Heart Failure Navigator Consult Note  Presentation: Robin [name] is a 63 y.o. Female with a hx of DM, afib, HTN, CAD who presents to the ED with increasing swelling and sob over the past 4-5 days. Pt was recently discharged from inpatient rehab with generalized weakness and functional decline in the setting of chronic venous stasis ulcers and acute renal failure. The patient was noted to have hyponatremia while in rehab with concerns for volume overload secondary to excess water consumption in the setting of acute renal failure. The patient's sodium was eventually improved with fluid restriction. Pt was ultimately discharged from rehab with PO lasix. Since discharge, pt reports being told to increase hydration. Pt had since noted worsening sob and decreased exercise tolerance with edema extending through the upper extremities. Pt then presented to the ED where BNP was noted to be 9984 with CXR findings of pulm edema with B new lower lobe consolidation suggestive of PNA.    Past Medical History  Diagnosis Date  . Diabetes mellitus   . Hypertension   . Hyperlipidemia   . Atrial fibrillation   . Sleep apnea   . Cholelithiasis   . CAD (coronary artery disease)     atrial fibrillation  . Shortness of breath   . Chronic kidney disease 04/06/2014    ACTUE RENAL FAILURE    History   Social History  . Marital Status: Married    Spouse Name: N/A    Number of Children: N/A  . Years of Education: N/A   Social History Main Topics  . Smoking status: Never Smoker   . Smokeless tobacco: Never Used  . Alcohol Use: No  . Drug Use: No  . Sexual Activity: None   Other Topics Concern  . None   Social History Narrative  . None    ECHO:Study Conclusions--04/25/2014  - Left ventricle: The cavity size was normal. Wall thickness was normal. Systolic function was normal. The estimated ejection fraction was in the range of 55% to 60%. The study is not technically sufficient to allow evaluation  of LV diastolic function. - Mitral valve: Calcified annulus. - Left atrium: The atrium was severely dilated. - Right atrium: The atrium was dilated. - Pulmonary arteries: PA peak pressure: 32 mm Hg (S).  Transthoracic echocardiography. M-mode, complete 2D, spectral Doppler, and color Doppler. Birthdate: Patient birthdate: 12-05-1950. Age: Patient is 63 yr old. Sex: Gender: female. Height: Height: 167.6 cm. Height: 66 in. Weight: Weight: 133.8 kg. Weight: 294.4 lb. Body mass index: BMI: 47.6 kg/m^2. Body surface area: BSA: 2.57 m^2. Blood pressure: 125/70 Patient status: Inpatient. Study date: Study date: 04/18/2014. Study time: 03:17 PM. Location: Bedside   BNP    Component Value Date/Time   PROBNP 9984.0* 04/11/2014 0931    Education Assessment and Provision:  Detailed education and instructions provided on heart failure disease management including the following:  Signs and symptoms of Heart Failure When to call the physician Importance of daily weights Low sodium diet Fluid restriction Medication management Anticipated future follow-up appointments  Patient education given on each of the above topics.  Patient and significant other acknowledge understanding and acceptance of all instructions.  HF is new to both of them.  We spent a great deal of time discussing the diagnosis and HF recommendations going forward.  She does have a scale and acknowledges that she gained a great deal of weight over the last 2 months.  She had been encouraged to "take in fluids" from last admission and had been  focusing on eating low carbohydrate meals with no relevance to salt.  I will put in a dietician consult as they acknowledge diet will be a challenge for them.  They were both very grateful for the information and open to teaching.  Education Materials:  "Living Better With Heart Failure" Booklet, Daily Weight Tracker Tool   High Risk Criteria for Readmission and/or Poor Patient  Outcomes:  (Recommend Follow-[facility] with [facility])   EF <30%- No--55-60%  2 or more admissions in 6 months- Yes  Difficult social situation- No--lives with significant other and his adult son that has mental handicap  Demonstrates medication noncompliance- No    Barriers of Care:  New to HF--Knowledge, compliance  Discharge Planning:   Plans at this time to discharge to home--however she seems to be very weak and was just discharge from hospital ([facility]) prior to admission.  She will need ongoing education and compliance reinforcement.  [facility].

## 2014-04-18 NOTE — Progress Notes (Addendum)
[facility] PROGRESS NOTE  Filed Weights   04/16/14 0347 04/17/14 0618 04/18/14 0603  Weight: 135.399 kg (298 lb 8 oz) 133.98 kg (295 lb 6 oz) 131.5 kg (289 lb 14.5 oz)        Intake/Output Summary (Last 24 hours) at 04/18/14 0802 Last data filed at 04/18/14 0602  Gross per 24 hour  Intake    942 ml  Output   1475 ml  Net   -533 ml     Assessment/Plan: Acute respiratory failure with hypoxia/Acute CHF (congestive heart failure) - Weight on admission 134-135 kg, 1 month ago was ~ 111 kg. protein to cr ratio < 3 grms, pt has anasarca. - Cont lasix drip low dose metolazone now with good diuresis. - Monitor electrolytes. Limit fluid intake. - Pt cont to be fluid overloaded, there is a mild increase in Cr from admission, but her baseline Cr. Is 1.5-1.8. Her estimated dry weight is 111 Kg.   Diabetes mellitus type 2 in obese - Cont Lantus plus SSI. - check HbgA1c  Ulcers of both lower legs, chronic venous stasis - unna boots. - WOC  Atrial fibrillation: - Rate controlled, coumadin per pharmacy. - INR slightly high.  Diabetic nephropathy CKD III: - Cr lower than baseline.  Candida intertrigo: - Diflucan orally.  Code Status: full Family Communication: none  Disposition Plan: inpatinet   Consultants:  noen  Procedures: ECHO: pending  Antibiotics:  vanc and cefepime one dose.  HPI/Subjective: Relates still SOB  Objective: Filed Vitals:   04/17/14 0949 04/17/14 1316 04/17/14 2013 04/18/14 0603  BP: 128/46 109/56 131/59 117/64  Pulse: 86 78 94 82  Temp:  97.6 F (36.4 C) 98.6 F (37 C) 97.5 F (36.4 C)  TempSrc:  Oral Oral Oral  Resp:  22 20 18   Height:      Weight:    131.5 kg (289 lb 14.5 oz)  SpO2:  92% 90% 98%     Exam: General: Alert, awake, oriented x3, in no acute distress. Morbidly obese HEENT: No bruits, no goiter.  Heart: Regular rate and rhythm, Anasarca Lungs: Good air movement, bilateral air movement.  Abdomen: Soft,  nontender, nondistended, positive bowel sounds.  Neuro: Grossly intact, nonfocal.   Data Reviewed: Basic Metabolic Panel:  Recent Labs Lab 04/29/2014 0931 04/16/14 0342 04/17/14 1031 04/18/14 0424  NA 139 140 141 142  K 3.5* 3.6* 3.0* 3.9  CL 98 100 97 99  CO2 26 27 29 31   GLUCOSE 138* 117* 97 112*  BUN 21 20 20 23   CREATININE 1.10 1.11* 1.05 1.28*  CALCIUM 8.6 8.2* 8.2* 8.1*   Liver Function Tests:  Recent Labs Lab 04/29/2014 0931 04/16/14 0342  AST 14 12  ALT 6 6  ALKPHOS 79 75  BILITOT 0.6 0.7  PROT 5.7* 5.6*  ALBUMIN 2.4* 2.4*   No results found for this basename: LIPASE, AMYLASE,  in the last 168 hours No results found for this basename: AMMONIA,  in the last 168 hours CBC:  Recent Labs Lab 04/13/2014 0931 04/16/14 0342  WBC 7.6 9.4  NEUTROABS 6.0  --   HGB 10.5* 9.8*  HCT 36.7 34.3*  MCV 81.4 80.5  PLT 305 306   Cardiac Enzymes:  Recent Labs Lab 04/08/2014 0931 04/06/2014 1400 04/22/2014 2100 04/16/14 0343  TROPONINI <0.30 <0.30 <0.30 <0.30   BNP (last 3 results)  Recent Labs  05/01/2014 0931  PROBNP 9984.0*   CBG:  Recent Labs Lab 04/17/14 0648 04/17/14 1100 04/17/14 1608 04/17/14  2135 04/18/14 0558  GLUCAP 87 100* 151* 144* 96    Recent Results (from the past 240 hour(s))  URINE CULTURE     Status: None   Collection Time    04/12/2014  9:06 AM      Result Value Ref Range Status   Specimen Description URINE, CATHETERIZED   Final   Special Requests NONE   Final   Culture  Setup Time     Final   Value: 04/29/2014 15:37     Performed at [facility] Count     Final   Value: 8,000 COLONIES/ML     Performed at [facility]   Culture     Final   Value: INSIGNIFICANT GROWTH     Performed at [facility]   Report Status 04/16/2014 FINAL   Final  CULTURE, BLOOD (ROUTINE X 2)     Status: None   Collection Time    04/18/2014  1:50 PM      Result Value Ref Range Status   Specimen Description Blood   Final    Special Requests NONE   Final   Culture  Setup Time     Final   Value: 04/27/2014 19:23     Performed at [facility]   Culture     Final   Value:        BLOOD CULTURE RECEIVED NO GROWTH TO DATE CULTURE WILL BE HELD FOR 5 DAYS BEFORE ISSUING A FINAL NEGATIVE REPORT     Performed at [facility]   Report Status PENDING   Incomplete  CULTURE, BLOOD (ROUTINE X 2)     Status: None   Collection Time    04/09/2014  2:00 PM      Result Value Ref Range Status   Specimen Description Blood   Final   Special Requests NONE   Final   Culture  Setup Time     Final   Value: 04/20/2014 19:23     Performed at [facility]   Culture     Final   Value:        BLOOD CULTURE RECEIVED NO GROWTH TO DATE CULTURE WILL BE HELD FOR 5 DAYS BEFORE ISSUING A FINAL NEGATIVE REPORT     Performed at [facility]   Report Status PENDING   Incomplete     Studies: No results found.  Scheduled Meds: . ALPRAZolam  0.5 mg Oral TID  . buPROPion  300 mg Oral Daily  . fluconazole  100 mg Oral Daily  . insulin aspart  0-15 Units Subcutaneous TID WC  . insulin aspart  0-5 Units Subcutaneous QHS  . insulin glargine  20 Units Subcutaneous QHS  . [START ON 04/20/2014] metoprolol tartrate  12.5 mg Oral BID  . simvastatin  20 mg Oral Daily  . sodium chloride  3 mL Intravenous Q12H  . warfarin  2.5 mg Oral ONCE-1800  . Warfarin - Pharmacist Dosing Inpatient   Does not apply q1800   Continuous Infusions: . furosemide (LASIX) infusion 10 mg/hr (04/17/14 1723)     Marinda Elk  [facility] Pager [phone]  If 8PM-8AM, please contact night-coverage at [email or web address], password [facility] 04/18/2014, 8:02 AM  LOS: 3 days     **Disclaimer: This note may have been dictated with voice recognition software. Similar sounding words can inadvertently be transcribed and this note may contain transcription errors which may not have been corrected upon publication of note.**

## 2014-04-18 NOTE — Progress Notes (Signed)
ANTICOAGULATION CONSULT NOTE - Follow Up Consult  Pharmacy Consult:  Coumadin Indication: atrial fibrillation  No Known Allergies  Patient Measurements: Height: 5\' 6"  (167.6 cm) Weight: 289 lb 14.5 oz (131.5 kg) IBW/kg (Calculated) : 59.3  Vital Signs: Temp: 97.5 F (36.4 C) (07/16 0603) Temp src: Oral (07/16 0603) BP: 117/64 mmHg (07/16 0603) Pulse Rate: 82 (07/16 0603)  Labs:  Recent Labs  Feb 06, 2014 0931 Feb 06, 2014 1400 Feb 06, 2014 2100 04/16/14 0342 04/16/14 0343 04/17/14 0542 04/17/14 1031 04/18/14 0424  HGB 10.5*  --   --  9.8*  --   --   --   --   HCT 36.7  --   --  34.3*  --   --   --   --   PLT 305  --   --  306  --   --   --   --   LABPROT 41.1*  --   --  47.3*  --  39.3*  --  32.4*  INR 4.28*  --   --  5.19*  --  4.04*  --  3.16*  CREATININE 1.10  --   --  1.11*  --   --  1.05 1.28*  TROPONINI <0.30 <0.30 <0.30  --  <0.30  --   --   --     Estimated Creatinine Clearance: 64.3 ml/min (by C-G formula based on Cr of 1.28).      Assessment: [age] YOF admitted with CHF exacerbation.  Pharmacy consulted to manage Coumadin for history of Afib.  INR continues to trend down and is slightly supra-therapeutic today.  Aware diflucan started and it can increase the effect of Coumadin.  No bleeding reported.   Goal of Therapy:  INR 2-3   Plan:  - Coumadin 2.5mg  PO today - Daily PT / INR - Watch renal fxn while on Lasix gtt    Natthew Marlatt D. Laney Potashang, PharmD, BCPS Pager:  [phone] 04/18/2014, 7:47 AM

## 2014-04-18 NOTE — Plan of Care (Signed)
Problem: Food- and Nutrition-Related Knowledge Deficit (NB-1.1) Goal: Nutrition education Formal process to instruct or train a patient/client in a skill or to impart knowledge to help patients/clients voluntarily manage or modify food choices and eating behavior to maintain or improve health. Outcome: Completed/Met Date Met:  04/18/14 Nutrition Education Note  RD consulted for nutrition education regarding new onset CHF.  RD provided "Low Sodium Nutrition Therapy" handout from the [employer]. Reviewed patient's dietary recall. Provided examples on ways to decrease sodium intake in diet. Discouraged intake of processed foods and use of salt shaker. Encouraged fresh fruits and vegetables as well as whole grain sources of carbohydrates to maximize fiber intake.   RD discussed why it is important for patient to adhere to diet recommendations, and emphasized the role of fluids, foods to avoid, and importance of weighing self daily. Teach back method used.  Expect good compliance.  Body mass index is 46.81 kg/(m^2). Pt meets criteria for morbid obesity based on current BMI.  Current diet order is heart healthy/carbohydrate modified, patient is consuming approximately 100% of meals at this time. Labs and medications reviewed. No further nutrition interventions warranted at this time. RD contact information provided. If additional nutrition issues arise, please re-consult RD.   Terrace Arabia RD, LDN

## 2014-04-18 NOTE — Evaluation (Signed)
Physical Therapy Evaluation Patient Details Name: Robin BaizeJudith M [name] MRN: [id number] DOB: 07/08/1951 Today's Date: 04/18/2014   History of Present Illness  Recent admit for bilateral LE cellulitis and AKI with generalized weakness. D/C'd to CIR and had been home ~9 days before returning to the ED for SOB. Pt admitted with fluid overload and is currently being treated for acute CHF.   Clinical Impression  Pt admitted with the above. Pt currently with functional limitations due to the deficits listed below (see PT Problem List). At the time of PT eval, pt required significant +2 assist for transfers. With stress of sitting EOB, O2 sats decreased to 79% on 2L/min and increased to 81% within 2 minutes. Supplemental O2 was increased to 3L/min and sats only improved to 83% while sitting EOB. Session was then ended and further mobility was deferred. Once supine, sats returned to 90% as they they were at rest prior to start of session, and supplemental O2 was decreased again to 2L/min. Feel pt would benefit from OOB, and that use of the maxi-move would be appropriate for nursing to use at this time. Pt will benefit from skilled PT to increase their independence and safety with mobility to allow discharge to the venue listed below.       Follow Up Recommendations CIR;Supervision for mobility/OOB    Equipment Recommendations  None recommended by PT    Recommendations for Other Services Rehab consult     Precautions / Restrictions Precautions Precautions: Fall Precaution Comments: Bilateral Una boots Restrictions Weight Bearing Restrictions: No      Mobility  Bed Mobility Overal bed mobility: +2 for physical assistance;Needs Assistance Bed Mobility: Supine to Sit;Sit to Supine     Supine to sit: Max assist;+2 for physical assistance;HOB elevated Sit to supine: Max assist;+2 for physical assistance   General bed mobility comments: Pt able to assist with initiation of movement. Hand-over-hand  to reach for bed rails for support. Assist with bed pad provided to scoot hips around, however max to total assist +2 required for pt to elevate trunk into full sitting position.   Transfers                 General transfer comment: Deferred due to O2 desaturation to 79% on 2L/min supplemental O2 with transfer to EOB. Supplemental O2 increased to 3L/min, however sats only improved to 83% while sitting up.   Ambulation/Gait                Stairs            Wheelchair Mobility    Modified Rankin (Stroke Patients Only)       Balance Overall balance assessment: Needs assistance Sitting-balance support: Feet supported;Bilateral upper extremity supported Sitting balance-Leahy Scale: Poor   Postural control: Posterior lean;Right lateral lean                                   Pertinent Vitals/Pain See assessment for O2 sats.     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 Son is  currently at [facility] for day program.     Prior Function Level of Independence: Independent with assistive device(s);Needs assistance   Gait / Transfers Assistance Needed: Cane  ADL's / Homemaking Assistance Needed: Pt washing up at the sink since returning home. States she is searching for a tub bench/shower chair that can fit her tub.         Hand Dominance   Dominant Hand: Right    Extremity/Trunk Assessment   Upper Extremity Assessment: Defer to OT  evaluation           Lower Extremity Assessment: Generalized weakness      Cervical / Trunk Assessment: Kyphotic  Communication   Communication: No difficulties  Cognition Arousal/Alertness: Awake/alert Behavior During Therapy: WFL for tasks assessed/performed Overall Cognitive Status: Within Functional Limits for tasks assessed                      General Comments      Exercises        Assessment/Plan    PT Assessment Patient needs continued PT services  PT Diagnosis Difficulty walking;Generalized weakness   PT Problem List Decreased strength;Decreased range of motion;Decreased activity tolerance;Decreased balance;Decreased mobility;Decreased safety awareness;Decreased knowledge of use of DME;Cardiopulmonary status limiting activity  PT Treatment Interventions DME instruction;Gait training;Stair training;Functional mobility training;Therapeutic activities;Therapeutic exercise;Neuromuscular re-education;Patient/family education   PT Goals (Current goals can be found in the Care Plan section) Acute Rehab PT Goals Patient Stated Goal: To return to level of independence she was at prior to readmission.  PT Goal Formulation: With patient Time For Goal Achievement: 04/20/2014 Potential to Achieve Goals: Good    Frequency Min 3X/week   Barriers to discharge        Co-evaluation               End of Session Equipment Utilized During Treatment: Oxygen Activity Tolerance: Patient limited by fatigue Patient left: in bed;with call bell/phone within reach Nurse Communication: Mobility status         Time: 1330-1410 PT Time Calculation (min): 40 min   Charges:   PT Evaluation $Initial PT Evaluation Tier I: 1 Procedure PT Treatments $Therapeutic Activity: 23-37 mins   PT G CodesRuthann [name] 04/18/2014, 3:13 PM  Robin [name], PT, DPT Acute Rehabilitation Services Pager: [phone]

## 2014-04-18 NOTE — Progress Notes (Signed)
Patient was positioned on the bedpan per request for she stated she felt like she needed to have a bowel movement. Patient states her last bowel movement 2-3 days ago and is requesting a stool softner. Text paged hospitalist. Orders given for colace. Will administer when available from pharmacy.

## 2014-04-19 DIAGNOSIS — N183 Chronic kidney disease, stage 3 unspecified: Secondary | ICD-10-CM

## 2014-04-19 DIAGNOSIS — I4891 Unspecified atrial fibrillation: Secondary | ICD-10-CM

## 2014-04-19 DIAGNOSIS — I5031 Acute diastolic (congestive) heart failure: Secondary | ICD-10-CM

## 2014-04-19 DIAGNOSIS — I5032 Chronic diastolic (congestive) heart failure: Secondary | ICD-10-CM | POA: Insufficient documentation

## 2014-04-19 LAB — BASIC METABOLIC PANEL
ANION GAP: 13 (ref 5–15)
BUN: 27 mg/dL — ABNORMAL HIGH (ref 6–23)
CALCIUM: 8.1 mg/dL — AB (ref 8.4–10.5)
CO2: 30 meq/L (ref 19–32)
Chloride: 95 mEq/L — ABNORMAL LOW (ref 96–112)
Creatinine, Ser: 1.41 mg/dL — ABNORMAL HIGH (ref 0.50–1.10)
GFR calc non Af Amer: 39 mL/min — ABNORMAL LOW (ref >90)
GFR, EST AFRICAN AMERICAN: 46 mL/min — AB (ref >90)
Glucose, Bld: 127 mg/dL — ABNORMAL HIGH (ref 70–99)
Potassium: 3.9 mEq/L (ref 3.7–5.3)
SODIUM: 138 meq/L (ref 137–147)

## 2014-04-19 LAB — GLUCOSE, CAPILLARY
GLUCOSE-CAPILLARY: 132 mg/dL — AB (ref 70–99)
Glucose-Capillary: 112 mg/dL — ABNORMAL HIGH (ref 70–99)
Glucose-Capillary: 152 mg/dL — ABNORMAL HIGH (ref 70–99)
Glucose-Capillary: 119 mg/dL — ABNORMAL HIGH (ref 70–99)

## 2014-04-19 LAB — PROTIME-INR
INR: 2.67 — ABNORMAL HIGH (ref 0.00–1.49)
Prothrombin Time: 28.4 seconds — ABNORMAL HIGH (ref 11.6–15.2)

## 2014-04-19 MED ORDER — WARFARIN SODIUM 5 MG PO TABS
5.0000 mg | ORAL_TABLET | Freq: Once | ORAL | Status: AC
  Administered 2014-04-19: 5 mg via ORAL
  Filled 2014-04-19: qty 1

## 2014-04-19 NOTE — Progress Notes (Signed)
UR completed Kalynne Womac K. Solana Coggin, RN, BSN, MSHL, CCM  04/19/2014 10:26 AM

## 2014-04-19 NOTE — Plan of Care (Signed)
Problem: Phase I Progression Outcomes Goal: EF % per last Echo/documented,Core Reminder form on chart Outcome: Completed/Met Date Met:  04/19/14 Echo performed on 04/11/2014 EF is 55-60%

## 2014-04-19 NOTE — Progress Notes (Signed)
ANTICOAGULATION CONSULT NOTE - Follow Up Consult  Pharmacy Consult:  Coumadin Indication: atrial fibrillation  No Known Allergies  Patient Measurements: Height: 5\' 6"  (167.6 cm) Weight: 295 lb 3.1 oz (133.9 kg) (bedscale) IBW/kg (Calculated) : 59.3  Vital Signs: Temp: 98 F (36.7 C) (07/17 1106) Temp src: Oral (07/17 1106) BP: 121/64 mmHg (07/17 1106) Pulse Rate: 87 (07/17 1106)  Labs:  Recent Labs  04/17/14 0542 04/17/14 1031 04/18/14 0424 04/19/14 0525  LABPROT 39.3*  --  32.4* 28.4*  INR 4.04*  --  3.16* 2.67*  CREATININE  --  1.05 1.28* 1.41*    Estimated Creatinine Clearance: 58.9 ml/min (by C-G formula based on Cr of 1.41).      Assessment: [age] YOF admitted with CHF exacerbation.  Pharmacy consulted to manage Coumadin for history of Afib.  INR continues to trend down and is now at goal this am (2.6).  Aware diflucan started and it can increase the effect of Coumadin but have not seen that yet.  No bleeding reported.  PTA dose was 5mg  daily  Goal of Therapy:  INR 2-3  Plan:  - Coumadin 5mg  PO today - Daily PT / INR  Sheppard CoilFrank Kelyn Koskela PharmD., BCPS Clinical Pharmacist Pager [phone] 04/19/2014 1:14 PM

## 2014-04-19 NOTE — Consult Note (Signed)
CARDIOLOGY CONSULT NOTE   Patient ID: Robin [name] MRN: [id number], DOB/AGE: [birth year]   Admit date: 04/09/2014 Date of Consult: 04/19/2014   Primary Physician: [name], MD Primary Cardiologist: Dr. Mayford Knife  Pt. Profile  morbidly obese 63 year old Caucasian female with past medical history of diabetes, hypertension, hyperlipidemia, persistent atrial fibrillation, obstructive sleep apnea, venous insufficiency and chronic diastolic heart failure presented with SOB and edema  Problem List  Past Medical History  Diagnosis Date  . Diabetes mellitus   . Hypertension   . Hyperlipidemia   . Atrial fibrillation   . Sleep apnea   . Cholelithiasis   . CAD (coronary artery disease)     atrial fibrillation  . Shortness of breath   . Chronic kidney disease 04/24/2014    ACTUE RENAL FAILURE    Past Surgical History  Procedure Laterality Date  . Tonsillectomy  1970s  . Hemorroidectomy  1990s  . Choledochal cyst excision  1980s  . Parathyroidectomy    . Endovenous ablation saphenous vein w/ laser  08-02-2011  Right greater saphenous vein     Allergies  No Known Allergies  HPI   The patient is a morbidly obese 63 year old Caucasian female with past medical history of diabetes, hypertension, hyperlipidemia, persistent atrial fibrillation, obstructive sleep apnea, venous insufficiency and chronic diastolic heart failure. Her last visit with Dr. Mayford Knife was over a year ago. Patient recently went to inpatient rehabilitation for functional deficit and was noted to have significant hyponatremia and hyperkalemia secondary to excess water consumption. Her sodium eventually improved with fluid restriction. Of note, she recently had one episode of urinary retention. She states she did not feel bloated at the time, however she was noted to have significant fluid in her bladder on imaging. Urinary retention improved after Foley placement. She was discharged from rehabilitation with PO  Lasix with help with advanced home care. According to the patient, she has been compliant with her medication and fluid restriction after her recent discharge. According to the internal medicine note, she has been told to be increased hydration. Patient and her husband began to note some increased edema in both upper and lower extremity starting 04/11/2014. She also complained of some shortness of breath both at night which would wake her up and also during the day as well when she is laying on her back. She endorse lower extremity edema, orthopnea, paroxysmal nocturnal dyspnea, however denies any significant fever, chill, cough.   Patient eventually presented to the [facility] ED on 04/29/2014 with increased swelling and shortness of breath. On arrival to ED, proBNP > 9000. Troponin negative. Creatinine 1.01. Albumin 2.4. Supratherapeutic INR of 4.28. Chest x-ray was consistent with bilateral pulmonary edema with lower lobe consolidation suggestive of pneumonia. She was treated briefly with antibiotics, however she was afebrile and her WBC that has been normal, therefore antibiotics has been discontinued. Echocardiogram was obtained on 04/28/2014 which showed EF 55-60%, unable to assess diastolic function. Patient was aggressively diuresed with 40 mg IV Lasix twice a day and also metolazone as well. Her creatinine was noted to have increased from 1.0 up to 1.4. Diuretic was changed to lasix gtt.   Inpatient Medications  . ALPRAZolam  0.5 mg Oral TID  . buPROPion  300 mg Oral Daily  . docusate sodium  200 mg Oral BID  . fluconazole  100 mg Oral Daily  . insulin aspart  0-15 Units Subcutaneous TID WC  . insulin aspart  0-5 Units Subcutaneous QHS  . insulin glargine  20 Units Subcutaneous QHS  . [START ON 04/20/2014] metoprolol tartrate  12.5 mg Oral BID  . simvastatin  20 mg Oral Daily  . sodium chloride  3 mL Intravenous Q12H  . warfarin  5 mg Oral ONCE-1800  . Warfarin - Pharmacist Dosing Inpatient    Does not apply q1800    Family History Family History  Problem Relation Age of Onset  . Hypertension Mother   . Heart disease Father [id number]    MI  . COPD Father   . Other Brother     heart issues  . Heart disease Brother   . Other Brother     heart issues  . Heart disease Brother   . Drug abuse Brother      Social History History   Social History  . Marital Status: Married    Spouse Name: N/A    Number of Children: N/A  . Years of Education: N/A   Occupational History  . Not on file.   Social History Main Topics  . Smoking status: Never Smoker   . Smokeless tobacco: Never Used  . Alcohol Use: No  . Drug Use: No  . Sexual Activity: Not on file   Other Topics Concern  . Not on file   Social History Narrative  . No narrative on file     Review of Systems  General:  No chills, fever, night sweats. +weight changes.  Cardiovascular:  No chest pain,  palpitations. +dyspnea on exertion, edema, orthopnea, paroxysmal nocturnal dyspnea. Dermatological: No rash, lesions/masses.  Respiratory: No cough. +dyspnea Urologic: No hematuria, dysuria Abdominal:   No nausea, vomiting, diarrhea, bright red blood per rectum, melena, or hematemesis Neurologic:  No visual changes, wkns, changes in mental status. All other systems reviewed and are otherwise negative except as noted above.  Physical Exam  Blood pressure 99/55, pulse 88, temperature 97.9 F (36.6 C), temperature source Oral, resp. rate 20, height 5\' 6"  (1.676 m), weight 295 lb 3.1 oz (133.9 kg), SpO2 90.00%.  General: Pleasant, NAD Psych: Normal affect. Neuro: Alert and oriented X 3. Moves all extremities spontaneously. HEENT: Normal  Neck: Supple without bruits or unable to assess JVD due to excessive fatty tissue Lungs:  Resp regular and unlabored, wheezing bilaterally with significantly decreased breath sound Heart: Irregular no s3, s4, or murmurs. Abdomen: Soft, non-distended, BS + x 4. Hernia seen. Mildly  distended Extremities: No clubbing, cyanosis. DP/PT/Radials 2+ and equal bilaterally. 2+ edema in bilateral LE. LE covered with compression dressing   Labs  No results found for this basename: CKTOTAL, CKMB, TROPONINI,  in the last 72 hours Lab Results  Component Value Date   WBC 9.4 04/16/2014   HGB 9.8* 04/16/2014   HCT 34.3* 04/16/2014   MCV 80.5 04/16/2014   PLT 306 04/16/2014    Recent Labs Lab 04/16/14 0342  04/19/14 0525  NA 140  < > 138  K 3.6*  < > 3.9  CL 100  < > 95*  CO2 27  < > 30  BUN 20  < > 27*  CREATININE 1.11*  < > 1.41*  CALCIUM 8.2*  < > 8.1*  PROT 5.6*  --   --   BILITOT 0.7  --   --   ALKPHOS 75  --   --   ALT 6  --   --   AST 12  --   --   GLUCOSE 117*  < > 127*  < > = values in this interval not  displayed. Lab Results  Component Value Date   CHOL 96 04/16/2014   HDL 36* 04/16/2014   LDLCALC 38 04/16/2014   TRIG 108 04/16/2014   No results found for this basename: DDIMER    Radiology/Studies  Dg Chest 2 View  04/10/2014   CLINICAL DATA:  Shortness of breath, mean to RIGHT side, history hypertension, diabetes, atrial fibrillation, coronary artery disease  EXAM: CHEST  2 VIEW  COMPARISON:  03/16/2014  FINDINGS: Minimal enlargement of cardiac silhouette.  Pulmonary vascular congestion.  Small RIGHT pleural effusion.  New opacification of the RIGHT lower lobe compatible with pneumonia.  Associated volume loss in RIGHT hemi thorax.  Atelectasis versus consolidation in retrocardiac LEFT lower lobe as well.  No pneumothorax.  Bones demineralized.  IMPRESSION: Enlargement of cardiac silhouette with pulmonary vascular congestion.  BILATERAL new lower lobe consolidation compatible with pneumonia.  Small RIGHT pleural effusion.   Electronically Signed   By: Ulyses Southward M.D.   On: 04/12/2014 10:29    ECG  A-fib with HR 60s  ASSESSMENT AND PLAN  1. Acute on chronic diastolic HF  - increase in Cr likely related to recent metolazone use. Recommend continue  diuresis with lasix only  - her baseline weight is unlikely to be 111-113. Per record, her weight was 113 on 7/13, however her weight increased to 134 kg with BSA changed from 2.28 to 2.5. Unclear what's baseline weight. Would not try to get her to goal of 113  - third spacing of fluid with hypoalbuminemia and chronic venous insufficiency are also problematic, however no good treatment except compression dressing  2. Hypoalbuminemia 3. Diabetes 4. Hypertension 5. Hyperlipidemia 6. persistent atrial fibrillation on coumadin and metoprolol 7. obstructive sleep apnea 8. venous insufficiency 9. Wheezing on exam: per primary team management  Signed, Azalee Course, PA-C 04/19/2014, 4:23 PM Patient seen and examined and history reviewed. Agree with above findings and plan. Pleasant 63 yo WF admitted with marked anasarca. Baseline weight is questionable. Weights from 2013 were 296. Recent weights from hospital and Rehab were quite variable. She appears to have good diuresis with IV lasix and metolazone the last 4 days - negative 5 liters. Weight really hasn't changed. Her legs below the knee are wrapped. She has brawny edema in the upper thighs and in the abdomen. Skin is quite thickened. Protein and albumin stores are low contributing to third spacing of fluid. Echo shows normal LV and RV function with biatrial enlargement. She does have a long history of severe venous insufficiency. She has chronic Afib with good rate control on low dose metoprolol. I think at this point I would continue IV lasix infusion. She is diuresing well. Metolazone seems to disproportionally affect her renal function and I would hold this as long as she is responding to lasix.   Nature Vogelsang [place], MDFACC 04/19/2014 5:05 PM

## 2014-04-19 NOTE — Progress Notes (Signed)
[facility] PROGRESS NOTE  Robin HeyJudith M [name] ZOX:[id number] DOB: 06/12/1951 DOA: 05/01/2014 PCP: Cala BradfordWHITE,[name], MD  Assessment/Plan: Principal Problem:   Acute diastolic CHF (congestive heart failure): Inadequate study on echocardiogram. Patient with anasarca. Weight today actually increased to 135 kg. Baseline around 111-113. Protein to creatinine ratio at less than 3 g. Currently on Lasix drip plus low-dose metolazone and responding with diuresis close to 5 L.  Creatinine mildly elevated at 1.4.  Active Problems:   Varicose veins of lower extremities with ulcer    Atrial fibrillation: Rate control. INR therapeutic.    Ulcers of both lower legs, chronic venous stasis: Appreciate wound care help. Continue unna boots.    Diabetes mellitus type 2 in obese: Surprisingly, A1c notes strong control her blood sugars at 5.5 (although it is of note that she has spent the last 2 months and hospitalization/inpatient rehabilitation)    Acute respiratory failure with hypoxia: Improving, currently on 2 L nasal cannula    CKD (chronic kidney disease), stage III    Diabetic nephropathy associated with diabetes mellitus due to underlying condition Morbid obesity: Patient meets criteria with a BMI greater than 40  Code Status: Full code Family Communication: Spoke with mother at the bedside.  Disposition Plan: Continue hospitalization until down to dry weight. Will need short-term skilled nursing   Consultants:  Cardiology    Procedures:  Echocardiogram done 7/13: Preserved ejection fraction. Inadequate study to assess diastolic dysfunction  Antibiotics:  Status post vancomycin/cefepime x1 dose on admission  HPI/Subjective: Patient doing okay. Minimal if any cough-nonproductive. No chest pain. Still very easily winded.  Objective: Filed Vitals:   04/19/14 1358  BP: 99/55  Pulse: 88  Temp: 97.9 F (36.6 C)  Resp: 20    Intake/Output Summary (Last 24 hours) at 04/19/14  1454 Last data filed at 04/19/14 1358  Gross per 24 hour  Intake    740 ml  Output   1600 ml  Net   -860 ml   Filed Weights   04/17/14 0618 04/18/14 0603 04/19/14 0440  Weight: 133.98 kg (295 lb 6 oz) 131.5 kg (289 lb 14.5 oz) 133.9 kg (295 lb 3.1 oz)    Exam:   General:  Alert and oriented x3, no acute distress  Cardiovascular: Irregular rhythm, rate controlled, soft 2/6 systolic ejection murmur appreciated  Respiratory: Decreased breath sounds secondary to body habitus otherwise clear  Abdomen: Soft, obese, nontender, positive bowel sounds  Musculoskeletal: Lower extremities Ace wrapped bilaterally   Data Reviewed: Basic Metabolic Panel:  Recent Labs Lab 04/13/2014 0931 04/16/14 0342 04/17/14 1031 04/18/14 0424 04/19/14 0525  NA 139 140 141 142 138  K 3.5* 3.6* 3.0* 3.9 3.9  CL 98 100 97 99 95*  CO2 26 27 29 31 30   GLUCOSE 138* 117* 97 112* 127*  BUN 21 20 20 23  27*  CREATININE 1.10 1.11* 1.05 1.28* 1.41*  CALCIUM 8.6 8.2* 8.2* 8.1* 8.1*   Liver Function Tests:  Recent Labs Lab 04/06/2014 0931 04/16/14 0342  AST 14 12  ALT 6 6  ALKPHOS 79 75  BILITOT 0.6 0.7  PROT 5.7* 5.6*  ALBUMIN 2.4* 2.4*   No results found for this basename: LIPASE, AMYLASE,  in the last 168 hours No results found for this basename: AMMONIA,  in the last 168 hours CBC:  Recent Labs Lab 04/25/2014 0931 04/16/14 0342  WBC 7.6 9.4  NEUTROABS 6.0  --   HGB 10.5* 9.8*  HCT 36.7 34.3*  MCV 81.4 80.5  PLT  305 306   Cardiac Enzymes:  Recent Labs Lab 04/17/2014 0931 04/19/2014 1400 04/29/2014 2100 04/16/14 0343  TROPONINI <0.30 <0.30 <0.30 <0.30   BNP (last 3 results)  Recent Labs  04/18/2014 0931  PROBNP 9984.0*   CBG:  Recent Labs Lab 04/18/14 1047 04/18/14 1620 04/18/14 2053 04/19/14 0659 04/19/14 1101  GLUCAP 143* 118* 138* 112* 152*       Studies: No results found.  Scheduled Meds: . ALPRAZolam  0.5 mg Oral TID  . buPROPion  300 mg Oral Daily  .  docusate sodium  200 mg Oral BID  . fluconazole  100 mg Oral Daily  . insulin aspart  0-15 Units Subcutaneous TID WC  . insulin aspart  0-5 Units Subcutaneous QHS  . insulin glargine  20 Units Subcutaneous QHS  . [START ON 04/20/2014] metoprolol tartrate  12.5 mg Oral BID  . simvastatin  20 mg Oral Daily  . sodium chloride  3 mL Intravenous Q12H  . warfarin  5 mg Oral ONCE-1800  . Warfarin - Pharmacist Dosing Inpatient   Does not apply q1800   Continuous Infusions: . 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 If 7PM-7AM, please contact night-coverage at [email or web address], password [facility] 04/19/2014, 2:54 PM  LOS: 4 days

## 2014-04-19 NOTE — Progress Notes (Signed)
Physical Therapy Treatment Patient Details Name: Robin [name] MRN: [id number] DOB: 1950/11/28 Today's Date: 04/19/2014    History of Present Illness Recent admit for bilateral LE cellulitis and AKI with generalized weakness. D/C'd to CIR and had been home ~9 days before returning to the ED for SOB. Pt admitted with fluid overload and is currently being treated for acute CHF.     PT Comments    Pt is showing slow progress towards physical therapy goals. O2 desaturation with transfer to EOB is hindering her ability to perform mobility and transfer OOB to recliner. BP improved during session into 130's/80's, however on 3 L/min supplemental O2 sats remained at 76-77% while sitting EOB. Will continue to follow and progress as able per POC.    Follow Up Recommendations  SNF;Supervision/Assistance - 24 hour     Equipment Recommendations  None recommended by PT    Recommendations for Other Services       Precautions / Restrictions Precautions Precautions: Fall Precaution Comments: Bilateral Una boots Restrictions Weight Bearing Restrictions: No    Mobility  Bed Mobility Overal bed mobility: +2 for physical assistance;Needs Assistance Bed Mobility: Supine to Sit;Sit to Supine     Supine to sit: Max assist;+2 for physical assistance;HOB elevated Sit to supine: Max assist;+2 for physical assistance   General bed mobility comments: Pt able to assist with initiation of movement. Hand-over-hand to reach for bed rails for support. Assist with bed pad provided to scoot hips around, however max to total assist +2 required for pt to elevate trunk into full sitting position.   Transfers                 General transfer comment: Deferred due to O2 desaturation to 76-77% on 3L/min supplemental O2 with transfer to EOB. Supplemental O2 increased to 3L/min with no improvement within 8 minutes of sitting EOB.   Ambulation/Gait                 Stairs             Wheelchair Mobility    Modified Rankin (Stroke Patients Only)       Balance Overall balance assessment: Needs assistance Sitting-balance support: Feet supported;Bilateral upper extremity supported Sitting balance-Leahy Scale: Poor   Postural control: Posterior lean;Right lateral lean                          Cognition Arousal/Alertness: Awake/alert Behavior During Therapy: WFL for tasks assessed/performed Overall Cognitive Status: Within Functional Limits for tasks assessed                      Exercises      General Comments General comments (skin integrity, edema, etc.): Discussed continuing HEP that she received in CIR      Pertinent Vitals/Pain See above for O2 sats during mobility.     Home Living                      Prior Function            PT Goals (current goals can now be found in the care plan section) Acute Rehab PT Goals Patient Stated Goal: To return to level of independence she was at prior to readmission.  PT Goal Formulation: With patient Time For Goal Achievement: 05/23/2014 Potential to Achieve Goals: Good Progress towards PT goals: Not progressing toward goals - comment (O2 sats hindering mobility)    Frequency  Min 3X/week    PT Plan Discharge plan needs to be updated    Co-evaluation             End of Session Equipment Utilized During Treatment: Oxygen Activity Tolerance: Patient limited by fatigue Patient left: in bed;with call bell/phone within reach     Time: 0852-0923 PT Time Calculation (min): 31 min  Charges:  $Therapeutic Activity: 23-37 mins                    G Codes:      Robin CancerHamilton, Robin [name] 04/19/2014, 9:39 AM  Robin CancerLaura [name], PT, DPT Acute Rehabilitation Services Pager: [phone]

## 2014-04-20 ENCOUNTER — Inpatient Hospital Stay (HOSPITAL_COMMUNITY): Payer: BC Managed Care – PPO

## 2014-04-20 DIAGNOSIS — J9602 Acute respiratory failure with hypercapnia: Secondary | ICD-10-CM

## 2014-04-20 DIAGNOSIS — I5032 Chronic diastolic (congestive) heart failure: Secondary | ICD-10-CM

## 2014-04-20 DIAGNOSIS — I5033 Acute on chronic diastolic (congestive) heart failure: Secondary | ICD-10-CM

## 2014-04-20 DIAGNOSIS — J9601 Acute respiratory failure with hypoxia: Secondary | ICD-10-CM | POA: Diagnosis not present

## 2014-04-20 LAB — BASIC METABOLIC PANEL
Anion gap: 13 (ref 5–15)
BUN: 28 mg/dL — ABNORMAL HIGH (ref 6–23)
CHLORIDE: 94 meq/L — AB (ref 96–112)
CO2: 33 meq/L — AB (ref 19–32)
Calcium: 8.3 mg/dL — ABNORMAL LOW (ref 8.4–10.5)
Creatinine, Ser: 1.25 mg/dL — ABNORMAL HIGH (ref 0.50–1.10)
GFR calc non Af Amer: 45 mL/min — ABNORMAL LOW (ref >90)
GFR, EST AFRICAN AMERICAN: 53 mL/min — AB (ref >90)
Glucose, Bld: 122 mg/dL — ABNORMAL HIGH (ref 70–99)
POTASSIUM: 3.4 meq/L — AB (ref 3.7–5.3)
SODIUM: 140 meq/L (ref 137–147)

## 2014-04-20 LAB — BLOOD GAS, ARTERIAL
ACID-BASE EXCESS: 7.8 mmol/L — AB (ref 0.0–2.0)
BICARBONATE: 34.1 meq/L — AB (ref 20.0–24.0)
DRAWN BY: 277331
O2 CONTENT: 4 L/min
O2 SAT: 91 %
Patient temperature: 97.9
TCO2: 36.3 mmol/L (ref 0–100)
pCO2 arterial: 70.1 mmHg (ref 35.0–45.0)
pH, Arterial: 7.306 — ABNORMAL LOW (ref 7.350–7.450)
pO2, Arterial: 65.8 mmHg — ABNORMAL LOW (ref 80.0–100.0)

## 2014-04-20 LAB — GLUCOSE, CAPILLARY
GLUCOSE-CAPILLARY: 144 mg/dL — AB (ref 70–99)
Glucose-Capillary: 112 mg/dL — ABNORMAL HIGH (ref 70–99)
Glucose-Capillary: 132 mg/dL — ABNORMAL HIGH (ref 70–99)

## 2014-04-20 LAB — PROTIME-INR
INR: 2.7 — AB (ref 0.00–1.49)
PROTHROMBIN TIME: 28.7 s — AB (ref 11.6–15.2)

## 2014-04-20 MED ORDER — WARFARIN SODIUM 4 MG PO TABS
4.0000 mg | ORAL_TABLET | Freq: Once | ORAL | Status: AC
  Administered 2014-04-20: 4 mg via ORAL
  Filled 2014-04-20: qty 1

## 2014-04-20 NOTE — Progress Notes (Signed)
CARDIOLOGY Progress  NOTE   Patient ID: Robin BaizeJudith M Sellitto MRN: [id number], DOB/AGE: 62/06/1951   Admit date: 04/05/2014 Date of Consult: 04/20/2014   Primary Physician: Cala BradfordWHITE,[name], MD Primary Cardiologist: Dr. Mayford Knifeurner  Pt. Profile  morbidly obese 63 year old Caucasian female with past medical history of diabetes, hypertension, hyperlipidemia, persistent atrial fibrillation, obstructive sleep apnea, venous insufficiency and chronic diastolic heart failure presented with SOB and edema  Problem List  Past Medical History  Diagnosis Date  . Diabetes mellitus   . Hypertension   . Hyperlipidemia   . Atrial fibrillation   . Sleep apnea   . Cholelithiasis   . CAD (coronary artery disease)     atrial fibrillation  . Shortness of breath   . Chronic kidney disease 04/24/2014    ACTUE RENAL FAILURE    Past Surgical History  Procedure Laterality Date  . Tonsillectomy  1970s  . Hemorroidectomy  1990s  . Choledochal cyst excision  1980s  . Parathyroidectomy    . Endovenous ablation saphenous vein w/ laser  08-02-2011  Right greater saphenous vein     Allergies  No Known Allergies  HPI   The patient is a morbidly obese 63 year old Caucasian female with past medical history of diabetes, hypertension, hyperlipidemia, persistent atrial fibrillation, obstructive sleep apnea, venous insufficiency and chronic diastolic heart failure. Her last visit with Dr. Mayford Knifeurner was over a year ago. Patient recently went to inpatient rehabilitation for functional deficit and was noted to have significant hyponatremia and hyperkalemia secondary to excess water consumption. Her sodium eventually improved with fluid restriction. Of note, she recently had one episode of urinary retention. She states she did not feel bloated at the time, however she was noted to have significant fluid in her bladder on imaging. Urinary retention improved after Foley placement. She was discharged from rehabilitation with PO  Lasix with help with advanced home care. According to the patient, she has been compliant with her medication and fluid restriction after her recent discharge. According to the internal medicine note, she has been told to be increased hydration. Patient and her husband began to note some increased edema in both upper and lower extremity starting 04/11/2014. She also complained of some shortness of breath both at night which would wake her up and also during the day as well when she is laying on her back. She endorse lower extremity edema, orthopnea, paroxysmal nocturnal dyspnea, however denies any significant fever, chill, cough.   [facility] with increased swelling and shortness of breath. On arrival to ED, proBNP > 9000. Troponin negative. Creatinine 1.01. Albumin 2.4. Supratherapeutic INR of 4.28. Chest x-ray was consistent with bilateral pulmonary edema with lower lobe consolidation suggestive of pneumonia. She was treated briefly with antibiotics, however she was afebrile and her WBC that has been normal, therefore antibiotics has been discontinued. Echocardiogram was obtained on 04/26/2014 which showed EF 55-60%, unable to assess diastolic function. Patient was aggressively diuresed with 40 mg IV Lasix twice a day and also metolazone as well. Her creatinine was noted to have increased from 1.0 up to 1.4. Diuretic was changed to lasix gtt.     Inpatient Medications  . ALPRAZolam  0.5 mg Oral TID  . buPROPion  300 mg Oral Daily  . docusate sodium  200 mg Oral BID  . fluconazole  100 mg Oral Daily  . insulin aspart  0-15 Units Subcutaneous TID WC  . insulin aspart  0-5 Units Subcutaneous QHS  .  insulin glargine  20 Units Subcutaneous QHS  . metoprolol tartrate  12.5 mg Oral BID  . simvastatin  20 mg Oral Daily  . sodium chloride  3 mL Intravenous Q12H  . Warfarin - Pharmacist Dosing Inpatient   Does not apply q1800    Family History Family  History  Problem Relation Age of Onset  . Hypertension Mother   . Heart disease Father 23    MI  . COPD Father   . Other Brother     heart issues  . Heart disease Brother   . Other Brother     heart issues  . Heart disease Brother   . Drug abuse Brother      Social History History   Social History  . Marital Status: Married    Spouse Name: N/A    Number of Children: N/A  . Years of Education: N/A   Occupational History  . Not on file.   Social History Main Topics  . Smoking status: Never Smoker   . Smokeless tobacco: Never Used  . Alcohol Use: No  . Drug Use: No  . Sexual Activity: Not on file   Other Topics Concern  . Not on file   Social History Narrative  . No narrative on file      Physical Exam  Blood pressure 99/37, pulse 80, temperature 97.9 F (36.6 C), temperature source Oral, resp. rate 20, height 5\' 6"  (1.676 m), weight 294 lb 1.5 oz (133.4 kg), SpO2 97.00%.  General: Pleasant, NAD Psych: Normal affect. Neuro: Alert and oriented X 3. Moves all extremities spontaneously. HEENT: Normal  Neck: thick, unable to assess JVD due to fatty tissue Lungs:  Resp regular and unlabored, wheezing bilaterally with significantly decreased breath sound Heart: Irregular no s3, s4, or murmurs. Abdomen: Soft, non-distended, BS + x 4. Hernia seen. Mildly distended Extremities: bilateral 2-3 + edema    Labs  No results found for this basename: CKTOTAL, CKMB, TROPONINI,  in the last 72 hours Lab Results  Component Value Date   WBC 9.4 04/16/2014   HGB 9.8* 04/16/2014   HCT 34.3* 04/16/2014   MCV 80.5 04/16/2014   PLT 306 04/16/2014    Recent Labs Lab 04/16/14 0342  04/20/14 0420  NA 140  < > 140  K 3.6*  < > 3.4*  CL 100  < > 94*  CO2 27  < > 33*  BUN 20  < > 28*  CREATININE 1.11*  < > 1.25*  CALCIUM 8.2*  < > 8.3*  PROT 5.6*  --   --   BILITOT 0.7  --   --   ALKPHOS 75  --   --   ALT 6  --   --   AST 12  --   --   GLUCOSE 117*  < > 122*  < > =  values in this interval not displayed. Lab Results  Component Value Date   CHOL 96 04/16/2014   HDL 36* 04/16/2014   LDLCALC 38 04/16/2014   TRIG 108 04/16/2014   No results found for this basename: DDIMER    Radiology/Studies   ECG  A-fib with HR 60s  ASSESSMENT AND PLAN  1. Acute on chronic diastolic HF  Echocardiogram on 713 reveals normal left ventricle systolic function with an ejection fraction of 55-60%. We were not able to evaluate her diastolic function because of her atrial fibrillation. Her left age of a severely dilated. Her pulmonary artery pressures normal.  I suspect  that her volume overload is multifactorial. She has some degree of malnutrition despite the fact that she is morbidly obese.    Continue  to try to diurese her.  2. Hypoalbuminemia 3. Diabetes 4. Hypertension 5. Hyperlipidemia 6. persistent atrial fibrillation on coumadin and metoprolol 7. obstructive sleep apnea 8. venous insufficiency 9. Wheezing on exam: per primary team management  Vesta Mixer, Montez Hageman., MD, [facility] 04/20/2014, 10:09 AM [address],  [address] Office [phone] Pager [phone]

## 2014-04-20 NOTE — Progress Notes (Signed)
Notified family of pt transfer.

## 2014-04-20 NOTE — Progress Notes (Signed)
[facility] PROGRESS NOTE  Ricke HeyJudith M Peacock ZHY:[id number] DOB: 03/01/1951 DOA: 04/23/2014 PCP: Cala BradfordWHITE,[name], MD  Previous 24 hours:  Morbidly obese woman with chronic kidney disease, diabetes and atrial fibrillation with recurrent admissions for diastolic heart failure readmitted on 7/13 for failure. Slowed response to diuresis. More somnolent today ABG noting pH of 7.3 with PCO2 of 70. Patient transferred to step down unit to get BiPAP.  Assessment/Plan: Principal Problem:    Acute respiratory failure with hypoxia and now hypercarbia: Initially improving. Patient now somnolent and hypercarbic. Transferred to step down. BiPAP.  Acute diastolic CHF (congestive heart failure): Inadequate study on echocardiogram. Patient with anasarca. Weight staying around 133 kg. Baseline around 111-113. Protein to creatinine ratio at less than 3 g. appreciate cardiology assistance. Metazalone stopped and just on Lasix drip. Active Problems:   Varicose veins of lower extremities with ulcer    Atrial fibrillation: Rate control. INR remains therapeutic.    Ulcers of both lower legs, chronic venous stasis: Appreciate wound care help. Continue unna boots.    Diabetes mellitus type 2 in obese: Surprisingly, A1c notes strong control her blood sugars at 5.5 (although it is of note that she has spent the last 2 months and hospitalization/inpatient rehabilitation)      Acute on CKD (chronic kidney disease), stage III: Creatinine trending back down to baseline    Diabetic nephropathy associated with diabetes mellitus due to underlying condition Morbid obesity: Patient meets criteria with a BMI greater than 40  Code Status: Full code Family Communication: Spoke with mother at the bedside.  Disposition Plan: Continue hospitalization until down to dry weight. Will need short-term skilled nursing   Consultants:  Cardiology    Procedures:  Echocardiogram done 7/13: Preserved ejection fraction.  Inadequate study to assess diastolic dysfunction  Antibiotics:  Status post vancomycin/cefepime x1 dose on admission  HPI/Subjective: Somnolent all day. Stopped scheduled Xanax. Patient remains still quite lethargic.  Objective: Filed Vitals:   04/20/14 1338  BP: 90/46  Pulse: 86  Temp: 97.9 F (36.6 C)  Resp: 20    Intake/Output Summary (Last 24 hours) at 04/20/14 1735 Last data filed at 04/20/14 1325  Gross per 24 hour  Intake 1555.5 ml  Output   1425 ml  Net  130.5 ml   Filed Weights   04/18/14 0603 04/19/14 0440 04/20/14 0512  Weight: 131.5 kg (289 lb 14.5 oz) 133.9 kg (295 lb 3.1 oz) 133.4 kg (294 lb 1.5 oz)    Exam:   General:  Somnolent  Cardiovascular: Irregular rhythm, rate controlled, soft 2/6 systolic ejection murmur appreciated  Respiratory: Decreased breath sounds secondary to body habitus otherwise clear, only limited inspiratory effort today  Abdomen: Soft, obese, nontender, positive bowel sounds  Musculoskeletal: Lower extremities, wrapped bilaterally   Data Reviewed: Basic Metabolic Panel:  Recent Labs Lab 04/16/14 0342 04/17/14 1031 04/18/14 0424 04/19/14 0525 04/20/14 0420  NA 140 141 142 138 140  K 3.6* 3.0* 3.9 3.9 3.4*  CL 100 97 99 95* 94*  CO2 27 29 31 30  33*  GLUCOSE 117* 97 112* 127* 122*  BUN 20 20 23  27* 28*  CREATININE 1.11* 1.05 1.28* 1.41* 1.25*  CALCIUM 8.2* 8.2* 8.1* 8.1* 8.3*   Liver Function Tests:  Recent Labs Lab 04/28/2014 0931 04/16/14 0342  AST 14 12  ALT 6 6  ALKPHOS 79 75  BILITOT 0.6 0.7  PROT 5.7* 5.6*  ALBUMIN 2.4* 2.4*   No results found for this basename: LIPASE, AMYLASE,  in the last  168 hours No results found for this basename: AMMONIA,  in the last 168 hours CBC:  Recent Labs Lab 04/19/2014 0931 04/16/14 0342  WBC 7.6 9.4  NEUTROABS 6.0  --   HGB 10.5* 9.8*  HCT 36.7 34.3*  MCV 81.4 80.5  PLT 305 306   Cardiac Enzymes:  Recent Labs Lab 30-Apr-2014 0931 04/23/2014 1400  April 30, 2014 2100 04/16/14 0343  TROPONINI <0.30 <0.30 <0.30 <0.30   BNP (last 3 results)  Recent Labs  30-Apr-2014 0931  PROBNP 9984.0*   CBG:  Recent Labs Lab 04/19/14 1608 04/19/14 2039 04/20/14 0551 04/20/14 1109 04/20/14 1602  GLUCAP 119* 132* 112* 144* 132*       Studies: No results found.  Scheduled Meds: . ALPRAZolam  0.5 mg Oral TID  . buPROPion  300 mg Oral Daily  . docusate sodium  200 mg Oral BID  . fluconazole  100 mg Oral Daily  . insulin aspart  0-15 Units Subcutaneous TID WC  . insulin aspart  0-5 Units Subcutaneous QHS  . insulin glargine  20 Units Subcutaneous QHS  . metoprolol tartrate  12.5 mg Oral BID  . simvastatin  20 mg Oral Daily  . sodium chloride  3 mL Intravenous Q12H  . warfarin  4 mg Oral ONCE-1800  . Warfarin - Pharmacist Dosing Inpatient   Does not apply q1800   Continuous Infusions: . furosemide (LASIX) infusion 10 mg/hr (04/19/14 2321)    Principal Problem:   Acute CHF (congestive heart failure) Active Problems:   Varicose veins of lower extremities with ulcer   Atrial fibrillation   Ulcers of both lower legs, chronic venous stasis   Diabetes mellitus type 2 in obese   Acute respiratory failure with hypoxia   CKD (chronic kidney disease), stage III   Diabetic nephropathy associated with diabetes mellitus due to underlying condition    Time spent: 35 minutes    Hollice Espy  [facility] Pager [phone]. If 7PM-7AM, please contact night-coverage at [email or web address], password [facility] 04/20/2014, 5:35 PM  LOS: 5 days

## 2014-04-20 NOTE — Clinical Social Work Psychosocial (Signed)
Clinical Social Work Department BRIEF PSYCHOSOCIAL ASSESSMENT 04/20/2014  Patient:  ANARI, EVITT     Account Number:  [id number]     Admit date:  04/25/2014  Clinical Social Worker:  Hubert Azure  Date/Time:  04/20/2014 05:46 PM  Referred by:  CSW  Date Referred:  04/20/2014 Referred for  Other - See comment   Other Referral:   From home.   Interview type:  Patient Other interview type:    PSYCHOSOCIAL DATA Living Status:  FAMILY Admitted from facility:   Level of care:   Primary support name:  Yaretsi Humphres [phone]) Primary support relationship to patient:  SPOUSE Degree of support available:   Good.    CURRENT CONCERNS Current Concerns  None Noted   Other Concerns:    SOCIAL WORK ASSESSMENT / PLAN CSW met with patient who was alert and oriented x4. CSW introduced self and explained role. Per patient, she resides at home with husband [name]) and son. Patient reported she is to begin receiving home health with [facility] on Monday, 7/20. Per patient, she is to receive nursing services, PT, and OT. Patient reported using a cane, walker, and wheelchair to assist with ambulation at home. Patient further stated she uses oxygen and has a bedside commode. Per patient, plan is to d/c home once medically stable.   Assessment/plan status:  No Further Intervention Required Other assessment/ plan:   Information/referral to community resources:    PATIENT'S/FAMILY'S RESPONSE TO PLAN OF CARE: Patient was optimistic about returning home. However, patient expressed she wants to be "improved" enough to return home.    [place], [place] Weekend Clinical Social Worker [phone]

## 2014-04-20 NOTE — Significant Event (Signed)
Dr Rito EhrlichKrishnan informed of ABG's results  . Orders received to transfer patient to stepdown for BiPAP treatment . Report called to 3 Theatre stage managersouth nurse . Patient transferred to 5 3s via bed with patient belongings.

## 2014-04-20 NOTE — Progress Notes (Signed)
ANTICOAGULATION CONSULT NOTE - Follow Up Consult  Pharmacy Consult:  Coumadin Indication: atrial fibrillation  No Known Allergies  Patient Measurements: Height: 5\' 6"  (167.6 cm) Weight: 294 lb 1.5 oz (133.4 kg) (bedscale) IBW/kg (Calculated) : 59.3  Vital Signs: Temp: 97.9 F (36.6 C) (07/18 0512) Temp src: Oral (07/18 0512) BP: 99/37 mmHg (07/18 0512) Pulse Rate: 80 (07/18 0512)  Labs:  Recent Labs  04/18/14 0424 04/19/14 0525 04/20/14 0420  LABPROT 32.4* 28.4* 28.7*  INR 3.16* 2.67* 2.70*  CREATININE 1.28* 1.41* 1.25*    Estimated Creatinine Clearance: 66.3 ml/min (by C-G formula based on Cr of 1.25).      Assessment: [age] YOF admitted with CHF exacerbation.  Pharmacy consulted to manage Coumadin for history of Afib.  INR therapeutic; no bleeding reported.  Aware diflucan started and it can increase the effect of Coumadin.   Goal of Therapy:  INR 2-3   Plan:  - Coumadin 4mg  PO today - Daily PT / INR - F/U Diflucan LOT    Kaveon Blatz D. Laney Potashang, PharmD, BCPS Pager:  [phone] 04/20/2014, 10:21 AM

## 2014-04-21 DIAGNOSIS — G4733 Obstructive sleep apnea (adult) (pediatric): Secondary | ICD-10-CM

## 2014-04-21 DIAGNOSIS — E662 Morbid (severe) obesity with alveolar hypoventilation: Secondary | ICD-10-CM

## 2014-04-21 DIAGNOSIS — R0602 Shortness of breath: Secondary | ICD-10-CM

## 2014-04-21 DIAGNOSIS — J9620 Acute and chronic respiratory failure, unspecified whether with hypoxia or hypercapnia: Secondary | ICD-10-CM

## 2014-04-21 LAB — BASIC METABOLIC PANEL
Anion gap: 13 (ref 5–15)
BUN: 29 mg/dL — ABNORMAL HIGH (ref 6–23)
CALCIUM: 8.2 mg/dL — AB (ref 8.4–10.5)
CO2: 33 mEq/L — ABNORMAL HIGH (ref 19–32)
Chloride: 93 mEq/L — ABNORMAL LOW (ref 96–112)
Creatinine, Ser: 1.06 mg/dL (ref 0.50–1.10)
GFR calc non Af Amer: 55 mL/min — ABNORMAL LOW (ref >90)
GFR, EST AFRICAN AMERICAN: 64 mL/min — AB (ref >90)
GLUCOSE: 115 mg/dL — AB (ref 70–99)
Potassium: 3 mEq/L — ABNORMAL LOW (ref 3.7–5.3)
Sodium: 139 mEq/L (ref 137–147)

## 2014-04-21 LAB — BLOOD GAS, ARTERIAL
ACID-BASE EXCESS: 11.2 mmol/L — AB (ref 0.0–2.0)
Bicarbonate: 36.7 mEq/L — ABNORMAL HIGH (ref 20.0–24.0)
Delivery systems: POSITIVE
Expiratory PAP: 6
FIO2: 0.5 %
Inspiratory PAP: 12
Mode: POSITIVE
O2 Saturation: 100 %
PCO2 ART: 64 mmHg — AB (ref 35.0–45.0)
Patient temperature: 98.6
RATE: 10 resp/min
TCO2: 38.6 mmol/L (ref 0–100)
pH, Arterial: 7.376 (ref 7.350–7.450)
pO2, Arterial: 115 mmHg — ABNORMAL HIGH (ref 80.0–100.0)

## 2014-04-21 LAB — GLUCOSE, CAPILLARY
Glucose-Capillary: 155 mg/dL — ABNORMAL HIGH (ref 70–99)
Glucose-Capillary: 93 mg/dL (ref 70–99)
Glucose-Capillary: 91 mg/dL (ref 70–99)
Glucose-Capillary: 117 mg/dL — ABNORMAL HIGH (ref 70–99)
Glucose-Capillary: 158 mg/dL — ABNORMAL HIGH (ref 70–99)

## 2014-04-21 LAB — CBC
HCT: 35.7 % — ABNORMAL LOW (ref 36.0–46.0)
Hemoglobin: 9.9 g/dL — ABNORMAL LOW (ref 12.0–15.0)
MCH: 22.9 pg — AB (ref 26.0–34.0)
MCHC: 27.7 g/dL — ABNORMAL LOW (ref 30.0–36.0)
MCV: 82.4 fL (ref 78.0–100.0)
PLATELETS: 307 10*3/uL (ref 150–400)
RBC: 4.33 MIL/uL (ref 3.87–5.11)
RDW: 21.8 % — AB (ref 11.5–15.5)
WBC: 8.3 10*3/uL (ref 4.0–10.5)

## 2014-04-21 LAB — PRO B NATRIURETIC PEPTIDE: PRO B NATRI PEPTIDE: 14034 pg/mL — AB (ref 0–125)

## 2014-04-21 LAB — PROTIME-INR
INR: 3.56 — ABNORMAL HIGH (ref 0.00–1.49)
Prothrombin Time: 35.6 seconds — ABNORMAL HIGH (ref 11.6–15.2)

## 2014-04-21 LAB — CULTURE, BLOOD (ROUTINE X 2)
CULTURE: NO GROWTH
Culture: NO GROWTH

## 2014-04-21 MED ORDER — CHLORHEXIDINE GLUCONATE 0.12 % MT SOLN
15.0000 mL | Freq: Two times a day (BID) | OROMUCOSAL | Status: DC
  Administered 2014-04-21 – 2014-04-30 (×16): 15 mL via OROMUCOSAL
  Filled 2014-04-21 (×22): qty 15

## 2014-04-21 MED ORDER — POTASSIUM CHLORIDE CRYS ER 20 MEQ PO TBCR
30.0000 meq | EXTENDED_RELEASE_TABLET | Freq: Two times a day (BID) | ORAL | Status: DC
  Administered 2014-04-21 – 2014-04-23 (×6): 30 meq via ORAL
  Filled 2014-04-21 (×8): qty 1

## 2014-04-21 MED ORDER — BIOTENE DRY MOUTH MT LIQD
15.0000 mL | Freq: Two times a day (BID) | OROMUCOSAL | Status: DC
  Administered 2014-04-21 – 2014-04-29 (×13): 15 mL via OROMUCOSAL

## 2014-04-21 NOTE — Consult Note (Signed)
Robin [name], TRAMPE NO.:  [id number]  MEDICAL RECORD NO.:  [id number]  LOCATION:  3S05C                        FACILITY:  [facility]  PHYSICIAN:  Barbaraann Share, MD,FCCPDATE OF BIRTH:  Jun 23, 1951  DATE OF CONSULTATION:  04/21/2014 DATE OF DISCHARGE:                                CONSULTATION   REFERRING PHYSICIAN:  [facility].  HISTORY OF PRESENT ILLNESS:  The patient is a morbidly obese 63 year old female, who I have been asked to see for acute on chronic respiratory failure and abnormal chest x-ray.  She has known obstructive sleep apnea, for which she is supposed to be on CPAP, but is totally noncompliant at home.  She also has obvious obesity hypoventilation with chronic hypercarbia based on her blood work on admission.  She has been noticing increasing lower extremity edema and worsening dyspnea over the last 4-5 days, and on presentation was found to be in obvious congestive heart failure with acute on chronic respiratory failure.  She has been admitted to the step-down unit and treated with BiPAP as well as diuresis with significant improvement.  Currently, she is on moderate flow oxygen with no increased work of breathing and adequate oxygen saturations.  PAST MEDICAL HISTORY: 1. Significant for obstructive sleep apnea/OHS. 2. Diabetes mellitus. 3. Hypertension. 4. Dyslipidemia. 5. Atrial fibrillation. 6. History of coronary disease. 7. History of cholelithiasis.  ALLERGIES:  She has no known drug allergies.  SOCIAL HISTORY:  She has never smoked, and she does not drink alcohol or use drugs.  FAMILY HISTORY:  Remarkable for hypertension, coronary disease, COPD.  REVIEW OF SYSTEMS:  Unremarkable except for that listed in the history of present illness.  PHYSICAL EXAMINATION:  GENERAL:  She is a morbidly obese female in no acute distress currently. VITAL SIGNS:  Blood pressure is 121/59, pulse is 76, respiratory rate is 23.  She is  afebrile.  Saturation is 99% on 4 L currently. HEENT:  Pupils are equal, round, reactive to light and accommodation. Extraocular movements are intact.  Nares are patent without discharge. Oropharynx is clear. NECK:  Supple without JVD or lymphadenopathy.  There is no palpable thyromegaly. CHEST:  Reveals very decreased breath sounds diffusely with small depth of inspiration as well as some basilar crackles.  There is no obvious wheezing or rhonchi. CARDIAC:  Reveals a slightly irregular rhythm with a controlled ventricular response. ABDOMEN:  Soft, nontender, nondistended with good bowel sounds.  She does have severe centripetal obesity. EXTREMITIES:  Lower extremities are wrapped but clearly edematous.  I am unable to palpate pulses distally. NEUROLOGICAL:  She is alert and oriented and moves all 4 extremities.  IMPRESSION:  Acute on chronic hypoxic and hypercarbic respiratory failure secondary to decompensated congestive heart failure along with obesity hypoventilation syndrome.  The patient has a proBNP greater than 14,000, and both her chest x-ray and clinical presentation are consistent with decompensated diastolic heart failure.  There is nothing to suggest a superimposed infection, and the patient denies any cough, mucus, or congestion.  She has been totally afebrile throughout her hospitalization.  She definitely has significant hypoaeration because of her centripetal obesity with basilar atelectasis, which makes her chest x-ray look a lot worse  than it actually is.  She may also have an element of fractured capillary syndrome related to increased hydrostatic pressure in her pulmonary capillary bed.  Regardless, the treatment for this is diuresis and supportive care.  She has clearly improved compared to her prior notes from the hospitalization and is currently on moderate flow oxygen with more than adequate saturations and no increased work  of breathing.  RECOMMENDATIONS: 1. Continue aggressive diuresis. 2. Mandatory bilevel support at bedtime and p.r.n. during the day if     needed. 3. Minimize supplemental oxygen as much as possible for oxygen     saturations of only 90% to 92% and no more. 4. I have stressed to the patient the importance of wearing her CPAP     device while at home, and not doing so can contribute to her     decompensated heart failure. 5. I have also counseled the patient on aggressive weight loss. There is nothing further to recommend for this hospitalization, and we will therefore sign off the case.  We would be happy to see her again if any issues or questions arise.     Barbaraann ShareKeith M. Roxan Yamamoto, MD,FCCP     [facility]  D:  04/21/2014  T:  04/21/2014  Job:  [id number]

## 2014-04-21 NOTE — Progress Notes (Addendum)
SUBJECTIVE:  Still SOB on BiPAP  OBJECTIVE:   Vitals:   Filed Vitals:   04/21/14 0430 04/21/14 0452 04/21/14 0834 04/21/14 0849  BP:  104/55 121/59 121/59  Pulse: 74 63 65 76  Temp:  97.4 F (36.3 C) 97.6 F (36.4 C)   TempSrc:  Axillary Axillary   Resp: 19 18 17 23   Height:      Weight:  298 lb 4.5 oz (135.3 kg)    SpO2: 100% 100% 99% 99%   I&O's:   Intake/Output Summary (Last 24 hours) at 04/21/14 0933 Last data filed at 04/21/14 0835  Gross per 24 hour  Intake    310 ml  Output   1550 ml  Net  -1240 ml   TELEMETRY: Reviewed telemetry pt in atrial fibrillation with CVR     PHYSICAL EXAM General: Well developed, well nourished, in no acute distress Head: Eyes PERRLA, No xanthomas.   Normal cephalic and atramatic  Lungs:   Decreased BS with scattered wheezes. Heart:   Irregularly irregular S1 S2 Pulses are 2+ & equal. Abdomen: Bowel sounds are positive, abdomen soft and non-tender without masses  Extremities:   No clubbing, cyanosis or edema.  DP +1 Neuro: Alert and oriented X 3. Psych:  Good affect, responds appropriately   LABS: Basic Metabolic Panel:  Recent Labs  40/98/11 0420 04/21/14 0259  NA 140 139  K 3.4* 3.0*  CL 94* 93*  CO2 33* 33*  GLUCOSE 122* 115*  BUN 28* 29*  CREATININE 1.25* 1.06  CALCIUM 8.3* 8.2*   Liver Function Tests: No results found for this basename: AST, ALT, ALKPHOS, BILITOT, PROT, ALBUMIN,  in the last 72 hours No results found for this basename: LIPASE, AMYLASE,  in the last 72 hours CBC:  Recent Labs  04/21/14 0259  WBC 8.3  HGB 9.9*  HCT 35.7*  MCV 82.4  PLT 307   Cardiac Enzymes: No results found for this basename: CKTOTAL, CKMB, CKMBINDEX, TROPONINI,  in the last 72 hours BNP: No components found with this basename: POCBNP,  D-Dimer: No results found for this basename: DDIMER,  in the last 72 hours Hemoglobin A1C: No results found for this basename: HGBA1C,  in the last 72 hours Fasting Lipid Panel: No  results found for this basename: CHOL, HDL, LDLCALC, TRIG, CHOLHDL, LDLDIRECT,  in the last 72 hours Thyroid Function Tests: No results found for this basename: TSH, T4TOTAL, FREET3, T3FREE, THYROIDAB,  in the last 72 hours Anemia Panel: No results found for this basename: VITAMINB12, FOLATE, FERRITIN, TIBC, IRON, RETICCTPCT,  in the last 72 hours Coag Panel:   Lab Results  Component Value Date   INR 3.56* 04/21/2014   INR 2.70* 04/20/2014   INR 2.67* 04/19/2014    RADIOLOGY: Dg Chest 2 View  04/29/2014   CLINICAL DATA:  Shortness of breath, mean to RIGHT side, history hypertension, diabetes, atrial fibrillation, coronary artery disease  EXAM: CHEST  2 VIEW  COMPARISON:  03/16/2014  FINDINGS: Minimal enlargement of cardiac silhouette.  Pulmonary vascular congestion.  Small RIGHT pleural effusion.  New opacification of the RIGHT lower lobe compatible with pneumonia.  Associated volume loss in RIGHT hemi thorax.  Atelectasis versus consolidation in retrocardiac LEFT lower lobe as well.  No pneumothorax.  Bones demineralized.  IMPRESSION: Enlargement of cardiac silhouette with pulmonary vascular congestion.  BILATERAL new lower lobe consolidation compatible with pneumonia.  Small RIGHT pleural effusion.   Electronically Signed   By: Ulyses Southward M.D.   On: 04/09/2014  10:29   Dg Chest Port 1 View  04/20/2014   CLINICAL DATA:  Hypercarbic respiratory failure  EXAM: PORTABLE CHEST - 1 VIEW  COMPARISON:  04/09/2014  FINDINGS: Bilateral small pleural effusions. Bilateral interstitial and alveolar airspace opacities. No pneumothorax. Assessment of the cardiac silhouette is difficult secondary to the presence of pulmonary disease. Unremarkable osseous structures.  IMPRESSION: 1. Bilateral interstitial and alveolar airspace opacities with bilateral small pleural effusions. Differential diagnosis includes pulmonary edema versus multilobar pneumonia versus ARDS.   Electronically Signed   By: Elige KoHetal  Patel   On:  04/20/2014 21:31    ASSESSMENT AND PLAN  1. Acute on chronic diastolic HF - worsening oxygenation yesterday and moved to stepdown for BiPAP.  O2sats 99% on BiPAP and 50% FIO2.  Chest xray yesterday showed bilateral interstitial and alveolar airspace opacities with  bilateral small pleural effusions. Differential diagnosis includes pulmonary edema versus multilobar pneumonia versus ARDS.  Weight up 4 lbs from yesterday.  Echocardiogram on 7/13 reveals normal left ventricle systolic function with an ejection fraction of 55-60%. We were not able to evaluate her diastolic function because of her atrial fibrillation. Her pulmonary artery pressures normal.   I suspect that her volume overload is multifactorial. She has some degree of malnutrition despite the fact that she is morbidly obese.  - Continue to try to diurese on Lasix gtt - recheck BNP today and if trending upward may need to add Metolazone 2. Hypoalbuminemia - per Hospitalist 3. Diabetes  4. Hypertension - adequately controlled 5. Hyperlipidemia  6. persistent atrial fibrillation - rate controlled - continue on coumadin and metoprolol  7. obstructive sleep apnea  8. venous insufficiency  9. Wheezing on exam now moved to stepdown due to hypoxia - per primary team management  10.  Hypokalemia - replete per hospitalist 11.  Anemia      Quintella ReichertURNER,Latrice Storlie R, MD  04/21/2014  9:33 AM

## 2014-04-21 NOTE — Progress Notes (Signed)
[facility] PROGRESS NOTE  Robin BaizeJudith M [name] WUJ:[id number] DOB: 11/14/1950 DOA: 2014/01/02 PCP: Robin [name],Robin [name]  History of present illness/Recap of last 24 hours:  Morbidly obese woman with chronic kidney disease, diabetes and atrial fibrillation with recurrent admissions for diastolic heart failure readmitted on 7/13 for failure. Slowed response to diuresis. Became more somnolent yesterday an ABG noted respiratory acidosis with hypercarbia. Started on BiPAP and transferred to step down unit. Patient still somnolent this morning, although ABG improved.  Assessment/Plan: Principal Problem:    Acute respiratory failure with hypoxia and now hypercarbia: Initially improving. Patient now somnolent and hypercarbic. Tolerating BiPAP. Have asked pulmonary for assistance.  Acute diastolic CHF (congestive heart failure): Inadequate study on echocardiogram. Patient with anasarca. Little change in weight Baseline around 111-113. Protein to creatinine ratio at less than 3 g. appreciate cardiology assistance. Metazalone stopped and just on Lasix drip. Down about 5.5 L  Hypokalemia: Secondary to diuresis. Replacing.  Active Problems:   Varicose veins of lower extremities with ulcer    Atrial fibrillation: Rate control. INR slightly supratherapeutic    Ulcers of both lower legs, chronic venous stasis: Appreciate wound care help. Continue unna boots.    Diabetes mellitus type 2 in obese: Surprisingly, A1c notes strong control her blood sugars at 5.5 (although it is of note that she has spent the last 2 months and hospitalization/inpatient rehabilitation)    Acute on CKD (chronic kidney disease), stage III: Creatinine back to baseline.     Diabetic nephropathy associated with diabetes mellitus due to underlying condition  Morbid obesity: Patient meets criteria with a BMI greater than 40  Code Status: Full code Family Communication: Left message with mother  Disposition Plan: Continue in  step down.   Consultants:  Cardiology  Pulmonary  Procedures:  Echocardiogram done 7/13: Preserved ejection fraction. Inadequate study to assess diastolic dysfunction  Antibiotics:  Status post vancomycin/cefepime x1 dose on admission  HPI/Subjective: Still remains somewhat somnolent, although a little bit more awake  Objective: Filed Vitals:   04/21/14 0849  BP: 121/59  Pulse: 76  Temp:   Resp: 23    Intake/Output Summary (Last 24 hours) at 04/21/14 1013 Last data filed at 04/21/14 0835  Gross per 24 hour  Intake    310 ml  Output   1550 ml  Net  -1240 ml   Filed Weights   04/19/14 0440 04/20/14 0512 04/21/14 0452  Weight: 133.9 kg (295 lb 3.1 oz) 133.4 kg (294 lb 1.5 oz) 135.3 kg (298 lb 4.5 oz)    Exam:   General:  Somnolent  Cardiovascular: Irregular rhythm, rate controlled, soft 2/6 systolic ejection murmur appreciated  Respiratory: Decreased breath sounds secondary to body habitus otherwise clear  Abdomen: Soft, obese, nontender, positive bowel sounds  Musculoskeletal: Lower extremities, wrapped bilaterally   Data Reviewed: Basic Metabolic Panel:  Recent Labs Lab 04/17/14 1031 04/18/14 0424 04/19/14 0525 04/20/14 0420 04/21/14 0259  NA 141 142 138 140 139  K 3.0* 3.9 3.9 3.4* 3.0*  CL 97 99 95* 94* 93*  CO2 29 31 30  33* 33*  GLUCOSE 97 112* 127* 122* 115*  BUN 20 23 27* 28* 29*  CREATININE 1.05 1.28* 1.41* 1.25* 1.06  CALCIUM 8.2* 8.1* 8.1* 8.3* 8.2*   Liver Function Tests:  Recent Labs Lab 07/02/14 0931 04/16/14 0342  AST 14 12  ALT 6 6  ALKPHOS 79 75  BILITOT 0.6 0.7  PROT 5.7* 5.6*  ALBUMIN 2.4* 2.4*   No results found for this basename:  LIPASE, AMYLASE,  in the last 168 hours No results found for this basename: AMMONIA,  in the last 168 hours CBC:  Recent Labs Lab 04/10/2014 0931 04/16/14 0342 04/21/14 0259  WBC 7.6 9.4 8.3  NEUTROABS 6.0  --   --   HGB 10.5* 9.8* 9.9*  HCT 36.7 34.3* 35.7*  MCV 81.4 80.5 82.4   PLT 305 306 307   Cardiac Enzymes:  Recent Labs Lab 04/26/2014 0931 04/14/2014 1400 04/21/2014 2100 04/16/14 0343  TROPONINI <0.30 <0.30 <0.30 <0.30   BNP (last 3 results)  Recent Labs  04/14/2014 0931 04/21/14 0259  PROBNP 9984.0* 14034.0*   CBG:  Recent Labs Lab 04/20/14 0551 04/20/14 1109 04/20/14 1602 04/20/14 2125 04/21/14 0834  GLUCAP 112* 144* 132* 155* 93       Studies: Dg Chest Port 1 View  04/20/2014   CLINICAL DATA:  Hypercarbic respiratory failure  EXAM: PORTABLE CHEST - 1 VIEW  COMPARISON:  04/28/2014  FINDINGS: Bilateral small pleural effusions. Bilateral interstitial and alveolar airspace opacities. No pneumothorax. Assessment of the cardiac silhouette is difficult secondary to the presence of pulmonary disease. Unremarkable osseous structures.  IMPRESSION: 1. Bilateral interstitial and alveolar airspace opacities with bilateral small pleural effusions. Differential diagnosis includes pulmonary edema versus multilobar pneumonia versus ARDS.   Electronically Signed   By: Elige Ko   On: 04/20/2014 21:31    Scheduled Meds: . ALPRAZolam  0.5 mg Oral TID  . antiseptic oral rinse  15 mL Mouth Rinse q12n4p  . buPROPion  300 mg Oral Daily  . chlorhexidine  15 mL Mouth Rinse BID  . docusate sodium  200 mg Oral BID  . fluconazole  100 mg Oral Daily  . insulin aspart  0-15 Units Subcutaneous TID WC  . insulin aspart  0-5 Units Subcutaneous QHS  . insulin glargine  20 Units Subcutaneous QHS  . metoprolol tartrate  12.5 mg Oral BID  . simvastatin  20 mg Oral Daily  . sodium chloride  3 mL Intravenous Q12H  . Warfarin - Pharmacist Dosing Inpatient   Does not apply q1800   Continuous Infusions: . furosemide (LASIX) infusion 10 mg/hr (04/21/14 0700)    Principal Problem:   Acute CHF (congestive heart failure) Active Problems:   Varicose veins of lower extremities with ulcer   Atrial fibrillation   Ulcers of both lower legs, chronic venous stasis   Diabetes  mellitus type 2 in obese   Acute respiratory failure with hypoxia   CKD (chronic kidney disease), stage III   Diabetic nephropathy associated with diabetes mellitus due to underlying condition    Time spent: 25 minutes    Robin [name]  [facility] Pager [phone]. If 7PM-7AM, please contact night-coverage at [email or web address], password [facility] 04/21/2014, 10:13 AM  LOS: 6 days

## 2014-04-21 NOTE — Consult Note (Signed)
Dictation #:  [id number]

## 2014-04-21 NOTE — Progress Notes (Signed)
ANTICOAGULATION CONSULT NOTE - Follow Up Consult  Pharmacy Consult:  Coumadin Indication: atrial fibrillation  No Known Allergies  Patient Measurements: Height: 5\' 6"  (167.6 cm) Weight: 298 lb 4.5 oz (135.3 kg) IBW/kg (Calculated) : 59.3  Vital Signs: Temp: 97.6 F (36.4 C) (07/19 0834) Temp src: Axillary (07/19 0834) BP: 121/59 mmHg (07/19 0849) Pulse Rate: 76 (07/19 0849)  Recent Labs  04/19/14 0525 04/20/14 0420 04/21/14 0259  HGB  --   --  9.9*  HCT  --   --  35.7*  PLT  --   --  307  LABPROT 28.4* 28.7* 35.6*  INR 2.67* 2.70* 3.56*  CREATININE 1.41* 1.25* 1.06    Estimated Creatinine Clearance: 78.9 ml/min (by C-G formula based on Cr of 1.06).   Assessment: [age] YOF admitted with CHF exacerbation.  Pharmacy consulted to manage Coumadin for history of Afib.  INR supratherapeutic upon admission at 4.28 (PTA warfarin 5mg  daily), supratherapeutic today at 3.56, no bleeding reported.  On diflucan which can increase INR.  Goal of Therapy:  INR 2-3  Plan:  1. Hold coumadin x1, may need to restart at a lower dose while on diflucan 2. Daily PT/INR, CBC 3. F/U Diflucan LOT  Waynette Butteryegan K. Kaulana Brindle, PharmD Clinical Pharmacy Resident Pager: [phone] 04/21/2014 9:19 AM

## 2014-04-21 NOTE — Progress Notes (Signed)
Benedetto Coons. Callahan, NP notified and aware of ABG results. Discussed to continue patient on Bipap. Updated patient on POC. Will continue to monitor.

## 2014-04-22 ENCOUNTER — Inpatient Hospital Stay (HOSPITAL_COMMUNITY): Payer: BC Managed Care – PPO

## 2014-04-22 DIAGNOSIS — R269 Unspecified abnormalities of gait and mobility: Secondary | ICD-10-CM

## 2014-04-22 DIAGNOSIS — R5381 Other malaise: Secondary | ICD-10-CM

## 2014-04-22 DIAGNOSIS — N390 Urinary tract infection, site not specified: Secondary | ICD-10-CM

## 2014-04-22 LAB — BASIC METABOLIC PANEL
Anion gap: 11 (ref 5–15)
BUN: 28 mg/dL — AB (ref 6–23)
CO2: 36 meq/L — AB (ref 19–32)
Calcium: 8.4 mg/dL (ref 8.4–10.5)
Chloride: 95 mEq/L — ABNORMAL LOW (ref 96–112)
Creatinine, Ser: 0.93 mg/dL (ref 0.50–1.10)
GFR calc Af Amer: 75 mL/min — ABNORMAL LOW (ref >90)
GFR, EST NON AFRICAN AMERICAN: 65 mL/min — AB (ref >90)
GLUCOSE: 131 mg/dL — AB (ref 70–99)
POTASSIUM: 3.2 meq/L — AB (ref 3.7–5.3)
Sodium: 142 mEq/L (ref 137–147)

## 2014-04-22 LAB — GLUCOSE, CAPILLARY
GLUCOSE-CAPILLARY: 137 mg/dL — AB (ref 70–99)
Glucose-Capillary: 110 mg/dL — ABNORMAL HIGH (ref 70–99)
Glucose-Capillary: 130 mg/dL — ABNORMAL HIGH (ref 70–99)
Glucose-Capillary: 130 mg/dL — ABNORMAL HIGH (ref 70–99)

## 2014-04-22 LAB — CBC
HEMATOCRIT: 35.2 % — AB (ref 36.0–46.0)
HEMOGLOBIN: 9.8 g/dL — AB (ref 12.0–15.0)
MCH: 22.8 pg — ABNORMAL LOW (ref 26.0–34.0)
MCHC: 27.8 g/dL — ABNORMAL LOW (ref 30.0–36.0)
MCV: 81.9 fL (ref 78.0–100.0)
PLATELETS: 307 10*3/uL (ref 150–400)
RBC: 4.3 MIL/uL (ref 3.87–5.11)
RDW: 21.6 % — ABNORMAL HIGH (ref 11.5–15.5)
WBC: 7.7 10*3/uL (ref 4.0–10.5)

## 2014-04-22 LAB — BLOOD GAS, ARTERIAL
Acid-Base Excess: 13.9 mmol/L — ABNORMAL HIGH (ref 0.0–2.0)
Bicarbonate: 39.1 mEq/L — ABNORMAL HIGH (ref 20.0–24.0)
Drawn by: 36496
FIO2: 0.36 %
O2 Saturation: 95.4 %
PCO2 ART: 61 mmHg — AB (ref 35.0–45.0)
PH ART: 7.423 (ref 7.350–7.450)
PO2 ART: 74.1 mmHg — AB (ref 80.0–100.0)
Patient temperature: 98.6
TCO2: 41 mmol/L (ref 0–100)

## 2014-04-22 LAB — URINALYSIS, ROUTINE W REFLEX MICROSCOPIC
BILIRUBIN URINE: NEGATIVE
Glucose, UA: NEGATIVE mg/dL
KETONES UR: NEGATIVE mg/dL
Leukocytes, UA: NEGATIVE
NITRITE: NEGATIVE
PROTEIN: NEGATIVE mg/dL
Specific Gravity, Urine: 1.015 (ref 1.005–1.030)
Urobilinogen, UA: 0.2 mg/dL (ref 0.0–1.0)
pH: 5 (ref 5.0–8.0)

## 2014-04-22 LAB — URINE MICROSCOPIC-ADD ON

## 2014-04-22 LAB — ALBUMIN: Albumin: 2.5 g/dL — ABNORMAL LOW (ref 3.5–5.2)

## 2014-04-22 LAB — PROTIME-INR
INR: 4.39 — ABNORMAL HIGH (ref 0.00–1.49)
Prothrombin Time: 41.9 seconds — ABNORMAL HIGH (ref 11.6–15.2)

## 2014-04-22 MED ORDER — ALBUMIN HUMAN 5 % IV SOLN
12.5000 g | Freq: Once | INTRAVENOUS | Status: AC
  Administered 2014-04-22: 12.5 g via INTRAVENOUS
  Filled 2014-04-22: qty 250

## 2014-04-22 MED ORDER — SODIUM CHLORIDE 0.9 % IJ SOLN: 10.0000 mL | INTRAMUSCULAR | Status: DC | PRN

## 2014-04-22 MED ORDER — SODIUM CHLORIDE 0.9 % IJ SOLN
10.0000 mL | Freq: Two times a day (BID) | INTRAMUSCULAR | Status: DC
  Administered 2014-04-22 – 2014-04-26 (×8): 10 mL

## 2014-04-22 MED ORDER — METOLAZONE 2.5 MG PO TABS
2.5000 mg | ORAL_TABLET | Freq: Two times a day (BID) | ORAL | Status: DC
  Administered 2014-04-22 – 2014-04-28 (×14): 2.5 mg via ORAL
  Filled 2014-04-22 (×18): qty 1

## 2014-04-22 MED ORDER — DEXTROSE 5 % IV SOLN
1.0000 g | INTRAVENOUS | Status: AC
  Administered 2014-04-22 – 2014-04-24 (×3): 1 g via INTRAVENOUS
  Filled 2014-04-22 (×3): qty 10

## 2014-04-22 NOTE — Progress Notes (Signed)
Peripherally Inserted Central Catheter/Midline Placement  The IV Nurse has discussed with the patient and/or persons authorized to consent for the patient, the purpose of this procedure and the potential benefits and risks involved with this procedure.  The benefits include less needle sticks, lab draws from the catheter and patient may be discharged home with the catheter.  Risks include, but not limited to, infection, bleeding, blood clot (thrombus formation), and puncture of an artery; nerve damage and irregular heat beat.  Alternatives to this procedure were also discussed.  PICC/Midline Placement Documentation  PICC / Midline Double Lumen 04/22/14 PICC Right Brachial 42 cm 1 cm (Active)  Indication for Insertion or Continuance of Line Poor Vasculature-patient has had multiple peripheral attempts or PIVs lasting less than 24 hours 04/22/2014  5:00 PM  Exposed Catheter (cm) 1 cm 04/22/2014  5:00 PM  Dressing Change Due 04/29/14 04/22/2014  5:00 PM       Stacie GlazeJoyce, Makaia Rappa Horton 04/22/2014, 5:23 PM

## 2014-04-22 NOTE — Progress Notes (Signed)
PT Cancellation Note  Patient Details Name: Robin BaizeJudith M [name] MRN: [id number] DOB: 12/18/1950   Cancelled Treatment:    Reason Eval/Treat Not Completed: Medical issues which prohibited therapy, respiratory issues.   Fabio AsaWerner, Dusten Ellinwood J 04/22/2014, 3:09 PM Charlotte Crumbevon Keiland Pickering, PT DPT  [phone]

## 2014-04-22 NOTE — Progress Notes (Signed)
I know pt from previous admission to inpt rehab 03/20/14 through 04/06/2014. I will follow her progress with therapy to assess tolerance to increased activity. Any [facility] [phone]

## 2014-04-22 NOTE — Progress Notes (Signed)
Utilization review completed.  

## 2014-04-22 NOTE — Progress Notes (Signed)
Patient Name: Robin [name] Date of Encounter: 04/22/2014     Principal Problem:   Acute respiratory failure with hypoxia and hypercarbia Active Problems:   Varicose veins of lower extremities with ulcer   Atrial fibrillation   Ulcers of both lower legs, chronic venous stasis   Acute diastolic CHF (congestive heart failure)   Diabetes mellitus type 2 in obese   Acute respiratory failure with hypoxia   CKD (chronic kidney disease), stage III   Diabetic nephropathy associated with diabetes mellitus due to underlying condition   Morbid obesity    SUBJECTIVE  Per nursing staff, patient on BiPAP overnight. Tried  this am, desated, and switch to venturi mask with 50% O2. Wearing mask when I saw her, appears to be weak, complain of some SOB. Pending PICC line today   CURRENT MEDS . albumin human  12.5 g Intravenous Once  . antiseptic oral rinse  15 mL Mouth Rinse q12n4p  . buPROPion  300 mg Oral Daily  . cefTRIAXone (ROCEPHIN)  IV  1 g Intravenous Q24H  . chlorhexidine  15 mL Mouth Rinse BID  . docusate sodium  200 mg Oral BID  . fluconazole  100 mg Oral Daily  . insulin aspart  0-15 Units Subcutaneous TID WC  . insulin aspart  0-5 Units Subcutaneous QHS  . insulin glargine  20 Units Subcutaneous QHS  . metolazone  2.5 mg Oral BID  . metoprolol tartrate  12.5 mg Oral BID  . potassium chloride  30 mEq Oral BID  . simvastatin  20 mg Oral Daily  . sodium chloride  3 mL Intravenous Q12H  . Warfarin - Pharmacist Dosing Inpatient   Does not apply q1800    OBJECTIVE  Filed Vitals:   04/22/14 0056 04/22/14 0426 04/22/14 0814 04/22/14 1042  BP:  99/59 116/59   Pulse: 62 58 72 74  Temp:  97.4 F (36.3 C) 97.6 F (36.4 C)   TempSrc:  Axillary Oral   Resp: 23 18 29 28   Height:      Weight:  301 lb 9.4 oz (136.8 kg)    SpO2: 99% 100% 94% 92%    Intake/Output Summary (Last 24 hours) at 04/22/14 1125 Last data filed at 04/22/14 0900  Gross per 24 hour  Intake     90  ml  Output    875 ml  Net   -785 ml   Filed Weights   04/20/14 0512 04/21/14 0452 04/22/14 0426  Weight: 294 lb 1.5 oz (133.4 kg) 298 lb 4.5 oz (135.3 kg) 301 lb 9.4 oz (136.8 kg)    PHYSICAL EXAM  General: Pleasant, NAD. Neuro: Alert and oriented X 3. Moves all extremities spontaneously. Psych: Normal affect. HEENT:  Normal  Neck: Supple without bruits. excessive tissue around neck, unable to assess JVP Lungs:  Resp regular and unlabored, anterior exam CTA Heart: irregular no s3, s4, or murmurs. Abdomen: Soft, non-tender, non-distended, BS + x 4. Hernia noted in abdomen Extremities: No clubbing, cyanosis. DP/PT/Radials 2+ and equal bilaterally. 2+ edema in B LE, covered with dressing  Accessory Clinical Findings  CBC  Recent Labs  04/21/14 0259 04/22/14 0244  WBC 8.3 7.7  HGB 9.9* 9.8*  HCT 35.7* 35.2*  MCV 82.4 81.9  PLT 307 307   Basic Metabolic Panel  Recent Labs  04/21/14 0259 04/22/14 0244  NA 139 142  K 3.0* 3.2*  CL 93* 95*  CO2 33* 36*  GLUCOSE 115* 131*  BUN 29* 28*  CREATININE  1.06 0.93  CALCIUM 8.2* 8.4    TELE  A-fib with HR 60-80s, tachypneic  ECG  No recent EKG  Radiology/Studies  Dg Chest 2 View  04/06/2014   CLINICAL DATA:  Shortness of breath, mean to RIGHT side, history hypertension, diabetes, atrial fibrillation, coronary artery disease  EXAM: CHEST  2 VIEW  COMPARISON:  03/16/2014  FINDINGS: Minimal enlargement of cardiac silhouette.  Pulmonary vascular congestion.  Small RIGHT pleural effusion.  New opacification of the RIGHT lower lobe compatible with pneumonia.  Associated volume loss in RIGHT hemi thorax.  Atelectasis versus consolidation in retrocardiac LEFT lower lobe as well.  No pneumothorax.  Bones demineralized.  IMPRESSION: Enlargement of cardiac silhouette with pulmonary vascular congestion.  BILATERAL new lower lobe consolidation compatible with pneumonia.  Small RIGHT pleural effusion.   Electronically Signed   By: Ulyses Southward M.D.   On: 04/17/2014 10:29   Dg Chest Port 1 View  04/20/2014   CLINICAL DATA:  Hypercarbic respiratory failure  EXAM: PORTABLE CHEST - 1 VIEW  COMPARISON:  04/26/2014  FINDINGS: Bilateral small pleural effusions. Bilateral interstitial and alveolar airspace opacities. No pneumothorax. Assessment of the cardiac silhouette is difficult secondary to the presence of pulmonary disease. Unremarkable osseous structures.  IMPRESSION: 1. Bilateral interstitial and alveolar airspace opacities with bilateral small pleural effusions. Differential diagnosis includes pulmonary edema versus multilobar pneumonia versus ARDS.   Electronically Signed   By: Elige Ko   On: 04/20/2014 21:31    ASSESSMENT AND PLAN  1. Acute on chronic respiratory insufficiency  - likely multifactorial with acute on chronic CHF, body habitus, hypoalbuminemia and noncompliance with CPAP  - Pulm critical care consulted, recommended aggressive diuresis, weight loss and CPAP  - continue lasix gtt diuresis, currently -6L net    2. Acute on chronic diastolic HF  - Echo  7/13 EF 55-60%, unable to assess diastolic function  - transferred to step down due to hypoxia, on venturi mask  - CXR on 7/18 showed bilateral interstitial and alveolar airspace opacities with  bilateral small pleural effusions. Differential diagnosis includes pulmonary edema versus multilobar pneumonia versus ARDS.  - weight increasing despite diuresis, I/O -6L, however weight increased from 289 --> 301, ?accuracy of weight vs i/o  - worsening proBNP 9000 --> 14000  - metolazone added to lasix gtt along with albumin, will monitor renal function closely  3. Hypoalbuminemia - per Hospitalist  4. Diabetes  5. Hypertension - adequately controlled  6. Hyperlipidemia  7. persistent atrial fibrillation - rate controlled  - continue on coumadin and metoprolol  8. obstructive sleep apnea, noncompliant with CPAP  9. venous insufficiency  10. Wheezing on exam now  moved to stepdown due to hypoxia - per primary team management  11. Hypokalemia - replete per hospitalist  12. Anemia   Signed, Azalee Course PA-C Pager: [phone]  History and all data above reviewed.  Patient examined.  I agree with the findings as above.  The patient exam reveals AVW:[id number],  Lungs: Decreased breath sounds  ,  Abd: Positive bowel sounds, no rebound no guarding, Ext Severe edema  .  All available labs, radiology testing, previous records reviewed. Agree with documented assessment and plan. Diastolic HF. Agree that we can try to restart the Zaroxolyn now.  I will place a hold on beta blocker as her BP has been running low.    Fayrene Fearing Ramie Palladino  12:19 PM  04/22/2014

## 2014-04-22 NOTE — Progress Notes (Signed)
CRITICAL VALUE ALERT  Critical value received: pCO2-61   Date of notification:  04/22/14   Time of notification:  0530  Critical value read back:Yes.    Nurse who received alert:  Leonie ManNancy Tura Roller RN  MD notified (1st page):  Lenny Pastelom Callahan  Time of first page: 0543  MD notified (2nd page):  Time of second page:  Responding MD:  Lenny Pastelom Callahan  Time MD responded:  [phone]

## 2014-04-22 NOTE — Progress Notes (Signed)
ANTICOAGULATION CONSULT NOTE - Follow Up Consult  Pharmacy Consult:  Coumadin Indication: atrial fibrillation  No Known Allergies  Patient Measurements: Height: 5\' 6"  (167.6 cm) Weight: 301 lb 9.4 oz (136.8 kg) IBW/kg (Calculated) : 59.3  Vital Signs: Temp: 97.4 F (36.3 C) (07/20 1216) Temp src: Oral (07/20 1216) BP: 122/58 mmHg (07/20 1216) Pulse Rate: 65 (07/20 1216)  Recent Labs  04/20/14 0420 04/21/14 0259 04/22/14 0244  HGB  --  9.9* 9.8*  HCT  --  35.7* 35.2*  PLT  --  307 307  LABPROT 28.7* 35.6* 41.9*  INR 2.70* 3.56* 4.39*  CREATININE 1.25* 1.06 0.93    Estimated Creatinine Clearance: 90.6 ml/min (by C-G formula based on Cr of 0.93).   Assessment: 63 yo female admitted with CHF exacerbation.  Pharmacy consulted to manage Coumadin for history of Afib.  INR supratherapeutic upon admission at 4.28 (PTA warfarin 5mg  daily), and remains supratherapeutic today at 4.39 no bleeding reported.  On diflucan which can potentiate INR.   Goal of Therapy:  INR 2-3   Plan:  1. Hold coumadin tonight 2. Daily PT/INR  3. F/U Diflucan LOT    Agapito GamesAlison Brodee Mauritz, PharmD, BCPS Clinical Pharmacist Pager: [phone] 04/22/2014 1:25 PM

## 2014-04-22 NOTE — Progress Notes (Signed)
[facility] PROGRESS NOTE  Robin [name] ZOX:[id number] DOB: 08/25/1951 DOA: 04/10/2014 PCP: Cala BradfordWHITE,[name], MD  History of present illness/Recap of last 24 hours:  Morbidly obese woman with chronic kidney disease, diabetes and atrial fibrillation with recurrent admissions for diastolic heart failure readmitted on 7/13 for failure. Slowed response to diuresis. Became more somnolent on 7/18 an ABG noted respiratory acidosis with hypercarbia. Started on BiPAP and transferred to step down unit.   More awake. BNP and weight increased.  Assessment/Plan: Principal Problem:    Acute respiratory failure with hypoxia and now hypercarbia: Transferred to step down. Now on nightly BiPAP, seen by pulmonary. Recommendations for nightly BiPAP and then eventually compliance at home with CPAP. Limiting sedating medications.  Acute diastolic CHF (congestive heart failure): Inadequate study on echocardiogram. Patient with anasarca. Little change in weight Baseline around 111-113. Protein to creatinine ratio at less than 3 g. appreciate cardiology assistance. Metazalone stopped and just on Lasix drip. On medications that patient is becoming more volume overloaded. Diuresed 6 L. BNP and weight up. Have restarted Zaroxolyn. We'll place PICC line, IV albumin  Hypokalemia: Secondary to diuresis. Replacing.  Active Problems:   Varicose veins of lower extremities with ulcer    Atrial fibrillation: Rate control. INR slightly supratherapeutic    Ulcers of both lower legs, chronic venous stasis: Appreciate wound care help. Continue unna boots.    Diabetes mellitus type 2 in obese: Surprisingly, A1c notes strong control her blood sugars at 5.5 (although it is of note that she has spent the last 2 months and hospitalization/inpatient rehabilitation)    Acute on CKD (chronic kidney disease), stage III: Creatinine back to baseline.     Diabetic nephropathy associated with diabetes mellitus due to underlying  condition  Morbid obesity: Patient meets criteria with a BMI greater than 40  UTI: Too numerous to count white cells on admission. Culture unimpressive. Will treat empirically.  We'll have to go with Rocephin as fluoroquinolone and Coumadin interaction and patient already coagulopathic  Code Status: Full code Family Communication: Left message with mother  Disposition Plan: Continue in step down with nightly BiPAP. Plan for further diuresis   Consultants:  Cardiology  Pulmonary  Procedures:  Echocardiogram done 7/13: Preserved ejection fraction. Inadequate study to assess diastolic dysfunction  Antibiotics:  Status post vancomycin/cefepime x1 dose on admission  HPI/Subjective: Awake and alert. States that breathing is still rough, although overall better.  Objective: Filed Vitals:   04/22/14 0814  BP: 116/59  Pulse: 72  Temp: 97.6 F (36.4 C)  Resp: 29    Intake/Output Summary (Last 24 hours) at 04/22/14 1035 Last data filed at 04/22/14 0900  Gross per 24 hour  Intake    100 ml  Output    875 ml  Net   -775 ml   Filed Weights   04/20/14 0512 04/21/14 0452 04/22/14 0426  Weight: 133.4 kg (294 lb 1.5 oz) 135.3 kg (298 lb 4.5 oz) 136.8 kg (301 lb 9.4 oz)    Exam:   General:  Somnolent  Cardiovascular: Irregular rhythm, rate controlled, soft 2/6 systolic ejection murmur appreciated  Respiratory: Decreased breath sounds secondary to body habitus otherwise clear  Abdomen: Soft, obese, nontender, positive bowel sounds  Musculoskeletal: Lower extremities, wrapped bilaterally   Data Reviewed: Basic Metabolic Panel:  Recent Labs Lab 04/18/14 0424 04/19/14 0525 04/20/14 0420 04/21/14 0259 04/22/14 0244  NA 142 138 140 139 142  K 3.9 3.9 3.4* 3.0* 3.2*  CL 99 95* 94* 93* 95*  CO2 31 30 33* 33* 36*  GLUCOSE 112* 127* 122* 115* 131*  BUN 23 27* 28* 29* 28*  CREATININE 1.28* 1.41* 1.25* 1.06 0.93  CALCIUM 8.1* 8.1* 8.3* 8.2* 8.4   Liver Function  Tests:  Recent Labs Lab 04/16/14 0342  AST 12  ALT 6  ALKPHOS 75  BILITOT 0.7  PROT 5.6*  ALBUMIN 2.4*   No results found for this basename: LIPASE, AMYLASE,  in the last 168 hours No results found for this basename: AMMONIA,  in the last 168 hours CBC:  Recent Labs Lab 04/16/14 0342 04/21/14 0259 04/22/14 0244  WBC 9.4 8.3 7.7  HGB 9.8* 9.9* 9.8*  HCT 34.3* 35.7* 35.2*  MCV 80.5 82.4 81.9  PLT 306 307 307   Cardiac Enzymes:  Recent Labs Lab 05/01/2014 1400 04/12/2014 2100 04/16/14 0343  TROPONINI <0.30 <0.30 <0.30   BNP (last 3 results)  Recent Labs  04/19/2014 0931 04/21/14 0259  PROBNP 9984.0* 14034.0*   CBG:  Recent Labs Lab 04/21/14 0834 04/21/14 1230 04/21/14 1730 04/21/14 2106 04/22/14 0812  GLUCAP 93 91 117* 158* 110*       Studies: Dg Chest Port 1 View  04/20/2014   CLINICAL DATA:  Hypercarbic respiratory failure  EXAM: PORTABLE CHEST - 1 VIEW  COMPARISON:  04/04/2014  FINDINGS: Bilateral small pleural effusions. Bilateral interstitial and alveolar airspace opacities. No pneumothorax. Assessment of the cardiac silhouette is difficult secondary to the presence of pulmonary disease. Unremarkable osseous structures.  IMPRESSION: 1. Bilateral interstitial and alveolar airspace opacities with bilateral small pleural effusions. Differential diagnosis includes pulmonary edema versus multilobar pneumonia versus ARDS.   Electronically Signed   By: Elige Ko   On: 04/20/2014 21:31    Scheduled Meds: . antiseptic oral rinse  15 mL Mouth Rinse q12n4p  . buPROPion  300 mg Oral Daily  . chlorhexidine  15 mL Mouth Rinse BID  . docusate sodium  200 mg Oral BID  . fluconazole  100 mg Oral Daily  . insulin aspart  0-15 Units Subcutaneous TID WC  . insulin aspart  0-5 Units Subcutaneous QHS  . insulin glargine  20 Units Subcutaneous QHS  . metolazone  2.5 mg Oral BID  . metoprolol tartrate  12.5 mg Oral BID  . potassium chloride  30 mEq Oral BID  .  simvastatin  20 mg Oral Daily  . sodium chloride  3 mL Intravenous Q12H  . Warfarin - Pharmacist Dosing Inpatient   Does not apply q1800   Continuous Infusions: . furosemide (LASIX) infusion 10 mg/hr (04/22/14 0900)    Principal Problem:   Acute CHF (congestive heart failure) Active Problems:   Varicose veins of lower extremities with ulcer   Atrial fibrillation   Ulcers of both lower legs, chronic venous stasis   Diabetes mellitus type 2 in obese   Acute respiratory failure with hypoxia   CKD (chronic kidney disease), stage III   Diabetic nephropathy associated with diabetes mellitus due to underlying condition    Time spent: 35 minutes    Hollice Espy  [facility] Pager [phone]. If 7PM-7AM, please contact night-coverage at [email or web address], password [facility] 04/22/2014, 10:35 AM  LOS: 7 days

## 2014-04-22 NOTE — Consult Note (Signed)
Physical Medicine and Rehabilitation Consult Reason for Consult: Deconditioning Referring Physician: [facility]   HPI: Robin [name] is a 63 y.o. right-handed female with history of atrial fibrillation on chronic Coumadin therapy, diastolic congestive heart failure as well as acute on chronic respiratory failure, chronic lower extremity leg ulcers. Patient well known to rehabilitation services from recent admission 03/20/2014 to 04/06/2014 for deconditioning related to multiple medical issues. She was discharged to home with her husband and family ambulating with a rolling walker and supervision level requiring minimal assist to get to standing position. Home health therapies were to begin 04/22/2014. Presented 04/14/2014 with increased shortness of breath as well as lower extremity edema. BNP of 9984. Chest x-ray with right lower lobe opacification compatible with pneumonia as well as suspect CHF. Placed on intravenous diuresis. Echocardiogram with ejection fraction of 60% and normal systolic function. Placed on broad-spectrum antibiotics. INR on admission of 4.28 and Coumadin ongoing for atrial fibrillation. Wound care nurse for followup for chronic lower extremity ulcers with Unna boots in place. Physical therapy evaluation completed 04/18/2014 with recommendations for physical medicine rehabilitation consult secondary to deconditioning.   Review of Systems  Respiratory: Positive for shortness of breath.   Cardiovascular: Positive for palpitations and leg swelling.  Gastrointestinal: Positive for constipation.  Psychiatric/Behavioral: Positive for depression.  All other systems reviewed and are negative.  Past Medical History  Diagnosis Date  . Diabetes mellitus   . Hypertension   . Hyperlipidemia   . Atrial fibrillation   . Sleep apnea   . Cholelithiasis   . CAD (coronary artery disease)     atrial fibrillation  . Shortness of breath   . Chronic kidney disease 04/14/2014   ACTUE RENAL FAILURE   Past Surgical History  Procedure Laterality Date  . Tonsillectomy  1970s  . Hemorroidectomy  1990s  . Choledochal cyst excision  1980s  . Parathyroidectomy    . Endovenous ablation saphenous vein w/ laser  08-02-2011  Right greater saphenous vein   Family History  Problem Relation Age of Onset  . Hypertension Mother   . Heart disease Father 43    MI  . COPD Father   . Other Brother     heart issues  . Heart disease Brother   . Other Brother     heart issues  . Heart disease Brother   . Drug abuse Brother    Social History:  reports that she has never smoked. She has never used smokeless tobacco. She reports that she does not drink alcohol or use illicit drugs. Allergies: No Known Allergies Medications Prior to Admission  Medication Sig Dispense Refill  . ALPRAZolam (XANAX) 0.5 MG tablet Take 1 tablet (0.5 mg total) by mouth 3 (three) times daily.  90 tablet  0  . bethanechol (URECHOLINE) 25 MG tablet Take 1 tablet (25 mg total) by mouth at bedtime.  30 tablet  0  . buPROPion (WELLBUTRIN XL) 300 MG 24 hr tablet Take 1 tablet (300 mg total) by mouth daily.  30 tablet  1  . furosemide (LASIX) 40 MG tablet Take 1 tablet (40 mg total) by mouth daily.  30 tablet  0  . HYDROcodone-acetaminophen (NORCO/VICODIN) 5-325 MG per tablet Take 1 tablet by mouth every 4 (four) hours as needed for moderate pain.  60 tablet  0  . insulin glargine (LANTUS) 100 UNIT/ML injection Inject 0.3 mLs (30 Units total) into the skin at bedtime.  10 mL  11  . nystatin (MYCOSTATIN/NYSTOP)  100000 UNIT/GM POWD Apply 1 g topically every evening.      . simvastatin (ZOCOR) 20 MG tablet Take 1 tablet (20 mg total) by mouth daily.  30 tablet  0  . sodium bicarbonate 650 MG tablet Take 1 tablet (650 mg total) by mouth 3 (three) times daily.  90 tablet  0  . warfarin (COUMADIN) 5 MG tablet Take 5 mg by mouth daily at 6 PM. 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 Son is currently at [facility] [employer] for day program.   Lives With: Spouse;Son  Functional History: Prior Function Level of Independence: Independent with assistive device(s);Needs assistance Gait / Transfers Assistance Needed: Cane ADL's / Homemaking Assistance Needed: Pt washing up at the sink since returning home. States she is searching for a tub bench/shower chair that can fit her tub.  Functional Status:  Mobility: Bed Mobility Overal bed mobility: +2 for physical assistance;Needs Assistance Bed Mobility: Supine to Sit;Sit to Supine Supine to sit: Max assist;+2 for physical assistance;HOB elevated Sit to supine: Max assist;+2 for physical assistance General bed mobility comments: Pt able to assist with initiation of movement. Hand-over-hand to reach for bed rails for support. Assist with bed pad provided to scoot hips around, however max to total assist +2 required for pt to elevate trunk into full sitting position.  Transfers General transfer comment: Deferred due to O2 desaturation to 76-77% on 3L/min supplemental O2 with transfer to EOB. Supplemental O2 increased to 3L/min with no improvement within 8 minutes of sitting EOB.       ADL:    Cognition: Cognition Overall Cognitive Status: Within Functional Limits for tasks assessed Orientation Level: Oriented X4 Cognition Arousal/Alertness: Awake/alert Behavior During Therapy: WFL for  tasks assessed/performed Overall Cognitive Status: Within Functional Limits for tasks assessed  Blood pressure 99/59, pulse 58, temperature 97.4 F (36.3 C), temperature source Axillary, resp. rate 18, height 5\' 6"  (1.676 m), weight 135.3 kg (298 lb 4.5 oz), SpO2 100.00%. Physical Exam  Constitutional: She is oriented to person, place, and time.  63 year old obese female.  HENT:  Head: Normocephalic.  Eyes: EOM are normal.  Neck: Normal range of motion. Neck supple. No thyromegaly present.  Cardiovascular: Normal rate and regular rhythm.   Respiratory:  Decreased breath sounds at the bases. Dyspneic with conversation  GI: Soft. Bowel sounds are normal. She exhibits no distension.  Neurological: She is alert and oriented to person, place, and time.  UE grossly 4/5. LLE's: HF 2, KE 2+, ankles 3-. Sensory exam decreased to PP/LT in both feet/ankles.   Skin:  Chronic leg ulcers lower extremities with dressings in place  Psychiatric: She has a normal mood and affect. Her behavior is normal. Judgment and thought content normal.    Results for orders placed during the hospital encounter of 04/07/2014 (from the past 24 hour(s))  GLUCOSE, CAPILLARY     Status: None   Collection Time    04/21/14  8:34 AM      Result Value Ref Range   Glucose-Capillary  93  70 - 99 mg/dL   Comment 1 Documented in Chart     Comment 2 Notify RN    GLUCOSE, CAPILLARY     Status: None   Collection Time    04/21/14 12:30 PM      Result Value Ref Range   Glucose-Capillary 91  70 - 99 mg/dL   Comment 1 Documented in Chart     Comment 2 Notify RN    GLUCOSE, CAPILLARY     Status: Abnormal   Collection Time    04/21/14  5:30 PM      Result Value Ref Range   Glucose-Capillary 117 (*) 70 - 99 mg/dL   Comment 1 Documented in Chart     Comment 2 Notify RN    GLUCOSE, CAPILLARY     Status: Abnormal   Collection Time    04/21/14  9:06 PM      Result Value Ref Range   Glucose-Capillary 158 (*) 70 - 99 mg/dL    Comment 1 Notify RN     Comment 2 Documented in Chart    PROTIME-INR     Status: Abnormal   Collection Time    04/22/14  2:44 AM      Result Value Ref Range   Prothrombin Time 41.9 (*) 11.6 - 15.2 seconds   INR 4.39 (*) 0.00 - 1.49  BASIC METABOLIC PANEL     Status: Abnormal   Collection Time    04/22/14  2:44 AM      Result Value Ref Range   Sodium 142  137 - 147 mEq/L   Potassium 3.2 (*) 3.7 - 5.3 mEq/L   Chloride 95 (*) 96 - 112 mEq/L   CO2 36 (*) 19 - 32 mEq/L   Glucose, Bld 131 (*) 70 - 99 mg/dL   BUN 28 (*) 6 - 23 mg/dL   Creatinine, Ser 1.610.93  0.50 - 1.10 mg/dL   Calcium 8.4  8.4 - 09.610.5 mg/dL   GFR calc non Af Amer 65 (*) >90 mL/min   GFR calc Af Amer 75 (*) >90 mL/min   Anion gap 11  5 - 15  CBC     Status: Abnormal   Collection Time    04/22/14  2:44 AM      Result Value Ref Range   WBC 7.7  4.0 - 10.5 K/uL   RBC 4.30  3.87 - 5.11 MIL/uL   Hemoglobin 9.8 (*) 12.0 - 15.0 g/dL   HCT 04.535.2 (*) 40.936.0 - 81.146.0 %   MCV 81.9  78.0 - 100.0 fL   MCH 22.8 (*) 26.0 - 34.0 pg   MCHC 27.8 (*) 30.0 - 36.0 g/dL   RDW 91.421.6 (*) 78.211.5 - 95.615.5 %   Platelets 307  150 - 400 K/uL  BLOOD GAS, ARTERIAL     Status: Abnormal   Collection Time    04/22/14  5:07 AM      Result Value Ref Range   FIO2 0.36     Delivery systems NASAL CANNULA     pH, Arterial 7.423  7.350 - 7.450   pCO2 arterial 61.0 (*) 35.0 - 45.0 mmHg   pO2, Arterial 74.1 (*) 80.0 - 100.0 mmHg   Bicarbonate 39.1 (*) 20.0 - 24.0 mEq/L   TCO2 41.0  0 - 100 mmol/L   Acid-Base Excess 13.9 (*) 0.0 - 2.0 mmol/L   O2 Saturation 95.4     Patient temperature 98.6     Collection site RIGHT RADIAL  Drawn by [phone]     Sample type ARTERIAL DRAW     Allens test (pass/fail) PASS  PASS   Dg Chest Port 1 View  04/20/2014   CLINICAL DATA:  Hypercarbic respiratory failure  EXAM: PORTABLE CHEST - 1 VIEW  COMPARISON:  04/14/2014  FINDINGS: Bilateral small pleural effusions. Bilateral interstitial and alveolar airspace opacities. No  pneumothorax. Assessment of the cardiac silhouette is difficult secondary to the presence of pulmonary disease. Unremarkable osseous structures.  IMPRESSION: 1. Bilateral interstitial and alveolar airspace opacities with bilateral small pleural effusions. Differential diagnosis includes pulmonary edema versus multilobar pneumonia versus ARDS.   Electronically Signed   By: Elige Ko   On: 04/20/2014 21:31    Assessment/Plan: Diagnosis: deconditioning due to ongoing medical issues.  1. Does the need for close, 24 hr/day medical supervision in concert with the patient's rehab needs make it unreasonable for this patient to be served in a less intensive setting? Yes 2. Co-Morbidities requiring supervision/potential complications: DM2, afib, chronic venous stasis ulcers, CKD, CHF 3. Due to bladder management, bowel management, safety, skin/wound care, disease management, medication administration, pain management and patient education, does the patient require 24 hr/day rehab nursing? Yes 4. Does the patient require coordinated care of a physician, rehab nurse, PT (1-2 hrs/day, 5 days/week) and OT (1-2 hrs/day, 5 days/week) to address physical and functional deficits in the context of the above medical diagnosis(es)? Yes Addressing deficits in the following areas: balance, endurance, locomotion, strength, transferring, bowel/bladder control, bathing, dressing, feeding, grooming, toileting and psychosocial support 5. Can the patient actively participate in an intensive therapy program of at least 3 hrs of therapy per day at least 5 days per week? Yes and Potentially 6. The potential for patient to make measurable gains while on inpatient rehab is good 7. Anticipated functional outcomes upon discharge from inpatient rehab are modified independent and supervision  with PT, modified independent and supervision with OT, n/a with SLP. 8. Estimated rehab length of stay to reach the above functional goals is:  10-15 days 9. Does the patient have adequate social supports to accommodate these discharge functional goals? Yes 10. Anticipated D/C setting: Home 11. Anticipated post D/C treatments: HH therapy and Outpatient therapy 12. Overall Rehab/Functional Prognosis: good  RECOMMENDATIONS: This patient's condition is appropriate for continued rehabilitative care in the following setting: CIR Patient has agreed to participate in recommended program. Yes and Potentially Note that insurance prior authorization may be required for reimbursement for recommended care.  Comment: Observe for increased activity tolerance. Rehab Admissions Coordinator to follow up.  Thanks,  Ranelle Oyster, MD, [place] [facility]     04/22/2014

## 2014-04-22 NOTE — Progress Notes (Signed)
Around 1800 [facility] had disconnected self from venti mask and was satting in the 40's. Pt was gray and clammy upon assessment and was connected back to venturi mask at 55%. . Saturations went back up to 80% but no further. Switched pt back to her bipap. Saturations continued to climb into the 90's%. Updated Dr. Rito EhrlichKrishnan at [phone]. Will continue to monitor pt.

## 2014-04-23 LAB — BASIC METABOLIC PANEL
ANION GAP: 11 (ref 5–15)
BUN: 28 mg/dL — ABNORMAL HIGH (ref 6–23)
CHLORIDE: 94 meq/L — AB (ref 96–112)
CO2: 39 meq/L — AB (ref 19–32)
Calcium: 8.8 mg/dL (ref 8.4–10.5)
Creatinine, Ser: 0.83 mg/dL (ref 0.50–1.10)
GFR calc Af Amer: 86 mL/min — ABNORMAL LOW (ref >90)
GFR calc non Af Amer: 75 mL/min — ABNORMAL LOW (ref >90)
Glucose, Bld: 116 mg/dL — ABNORMAL HIGH (ref 70–99)
POTASSIUM: 3.2 meq/L — AB (ref 3.7–5.3)
SODIUM: 144 meq/L (ref 137–147)

## 2014-04-23 LAB — GLUCOSE, CAPILLARY
Glucose-Capillary: 109 mg/dL — ABNORMAL HIGH (ref 70–99)
Glucose-Capillary: 129 mg/dL — ABNORMAL HIGH (ref 70–99)
Glucose-Capillary: 126 mg/dL — ABNORMAL HIGH (ref 70–99)

## 2014-04-23 LAB — PROTIME-INR
INR: 4.7 — ABNORMAL HIGH (ref 0.00–1.49)
Prothrombin Time: 44.2 seconds — ABNORMAL HIGH (ref 11.6–15.2)

## 2014-04-23 MED ORDER — POTASSIUM CHLORIDE CRYS ER 20 MEQ PO TBCR: 40.0000 meq | EXTENDED_RELEASE_TABLET | Freq: Once | ORAL | Status: DC

## 2014-04-23 NOTE — Progress Notes (Signed)
Physical Therapy Treatment Patient Details Name: Robin [name] MRN: [id number] DOB: 1951-07-18 Today's Date: 04/23/2014    History of Present Illness Recent admit for bilateral LE cellulitis and AKI with generalized weakness. D/C'd to CIR and had been home ~9 days before returning to the ED for SOB. Pt admitted with fluid overload and is currently being treated for acute CHF.     PT Comments    Patient agreeable to therapy, performed some general ankle limited ankle ROM.  Attempted to come upright to long sitting in bed with use of bed, patient could not tolerate, began to desaturate. Assisted patient with rolling and scooting up in bed, nsg called to room. Patient placed on bi-pap mask and session concluded. Will continue to see and progress as tolerated. Feel patient need continued skilled therapy upon acute discharge   Follow Up Recommendations  SNF;Supervision/Assistance - 24 hour     Equipment Recommendations  None recommended by PT    Recommendations for Other Services Rehab consult     Precautions / Restrictions Precautions Precautions: Fall Precaution Comments: Bilateral Una boots    Mobility  Bed Mobility Overal bed mobility: +2 for physical assistance;Needs Assistance Bed Mobility: Rolling (scooting in bed) Rolling: Max assist;+2 for physical assistance   Supine to sit: Max assist;+2 for physical assistance;HOB elevated (use of bed to elevate, could not tolerate coming to sitting)     General bed mobility comments: patient assisted with repositioning and scooting in bed, attempted coming to sitting with use of bed, patient began to desaturate low 80s, did not pursure full attempt to come to sitting   Transfers                    Ambulation/Gait                 Stairs            Wheelchair Mobility    Modified Rankin (Stroke Patients Only)       Balance                                    Cognition  Arousal/Alertness: Awake/alert Behavior During Therapy: WFL for tasks assessed/performed Overall Cognitive Status: Within Functional Limits for tasks assessed                      Exercises      General Comments        Pertinent Vitals/Pain SpO2 decreased to 82% on venti mask, placed on bi-pap with consistent desaturation, patient reports low back pain    Home Living                      Prior Function            PT Goals (current goals can now be found in the care plan section) Acute Rehab PT Goals Patient Stated Goal: To return to level of independence she was at prior to readmission.  PT Goal Formulation: With patient Time For Goal Achievement: 05/01/2014 Potential to Achieve Goals: Fair Progress towards PT goals: Not progressing toward goals - comment (O2 saturations decreased)    Frequency  Min 3X/week    PT Plan Discharge plan needs to be updated    Co-evaluation             End of Session Equipment Utilized During Treatment: Oxygen Activity Tolerance: Patient limited  by fatigue Patient left: in bed;with call bell/phone within reach     Time: [id number] PT Time Calculation (min): 13 min  Charges:  $Therapeutic Activity: 8-22 mins                    G CodesFabio Asa:      Ervin Rothbauer J 04/23/2014, 4:56 PM Charlotte Crumbevon Jasmynn Pfalzgraf, PT DPT  [phone]

## 2014-04-23 NOTE — Progress Notes (Signed)
Placed patient on BIPAP for QHS. RN aware.

## 2014-04-23 NOTE — Clinical Social Work Placement (Signed)
Clinical Social Work Department CLINICAL SOCIAL WORK PLACEMENT NOTE 04/23/2014  Patient:  Robin [name],Robin [name]  Account Number:  [id number] Admit date:  04/11/2014  Clinical Social Worker:  Vivi Barrackrystal Patrick-jefferson, LCSWA  Date/time:  04/23/2014 02:29 PM  Clinical Social Work is seeking post-discharge placement for this patient at the following level of care:   SKILLED NURSING   (*CSW will update this form in Epic as items are completed)   04/22/2014  Patient/family provided with [facility] Department of Clinical Social Work's list of facilities offering this level of care within the geographic area requested by the patient (or if unable, by the patient's family).  04/22/2014  Patient/family informed of their freedom to choose among providers that offer the needed level of care, that participate in Medicare, Medicaid or managed care program needed by the patient, have an available bed and are willing to accept the patient.  04/22/2014  Patient/family informed of [facility]' ownership interest in [facility], as well as of the fact that they are under no obligation to receive care at this facility.  PASARR submitted to EDS on  PASARR number received on   FL2 transmitted to all facilities in geographic area requested by pt/family on  04/22/2014 FL2 transmitted to all facilities within larger geographic area on   Patient informed that his/her managed care company has contracts with or will negotiate with  certain facilities, including the following:     Patient/family informed of bed offers received:  04/23/2014 [facility] [place] Physician recommends and patient chooses bed at    Patient to be transferred to  on   Patient to be transferred to facility by  Patient and family notified of transfer on  Name of family member notified:    The following physician request were entered in Epic:   Additional Comments:  Naiah Donahoe  Patrick-Jefferson, LCSWA Weekend Clinical Social Worker [phone]

## 2014-04-23 NOTE — Progress Notes (Addendum)
[facility] PROGRESS NOTE  Robin [name] ZOX:[id number] DOB: Jul 16, 1951 DOA: 04/29/2014 PCP: [name], MD  History of present illness/Recap of last 24 hours:  Morbidly obese woman with chronic kidney disease, diabetes and atrial fibrillation with recurrent admissions for diastolic heart failure readmitted on 7/13 for failure, in part from noncompliance with CPAP. Slowed response to diuresis. Became more somnolent on 7/18 an ABG noted respiratory acidosis with hypercarbia. Started on BiPAP and transferred to step down unit.   More awake. BNP and weight increased. Since being and stepped up, she has become more alert with nightly BiPAP, but then we have difficult time weaning her oxygen down. 7/20: At the PICC line and started IV albumin in hopes treat third spacing fluids  Assessment/Plan: Principal Problem:    Acute respiratory failure with hypoxia and now hypercarbia: Transferred to step down. Now on nightly BiPAP, seen by pulmonary. Recommendations for nightly BiPAP and then eventually compliance at home with CPAP. Limiting sedating medications.  Acute diastolic CHF (congestive heart failure): Inadequate study on echocardiogram. Patient with anasarca. Little change in weight Baseline around 111-113.appreciate cardiology assistance.  Restarted Zaroxolyn 7/20. Also with PICC line and IV of human. As of 7/21 has diuresed 7.5 L  Hypokalemia: Secondary to diuresis. Replacing.  Active Problems:   Varicose veins of lower extremities with ulcer    Atrial fibrillation: Rate control. INR remains supratherapeutic,? Secondary to hepatic congestion from volume overload and/or Diflucan    Ulcers of both lower legs, chronic venous stasis: Appreciate wound care help. Continue unna boots.    Diabetes mellitus type 2 in obese: Surprisingly, A1c notes strong control her blood sugars at 5.5 (although it is of note that she has spent the last 2 months and hospitalization/inpatient  rehabilitation)    Acute on CKD (chronic kidney disease), stage III: Creatinine back to baseline.     Diabetic nephropathy associated with diabetes mellitus due to underlying condition  Morbid obesity: Patient meets criteria with a BMI greater than 40  UTI: Too numerous to count white cells on admission. Culture unimpressive. Will treat empirically.  Finishes Diflucan today 7 days, Rocephin 7/22 after the 3 doses total  Coagulopathy: INR still quite high. Suspect secondary to Diflucan plus/minus secondary hepatic congestion from volume overload. Pharmacy helping to manage.  Code Status: Full code Family Communication: Left message with mother  Disposition Plan: Continue in step down with nightly BiPAP. Plan for further diuresis   Consultants:  Cardiology  Pulmonary  Procedures:  Echocardiogram done 7/13: Preserved ejection fraction. Inadequate study to assess diastolic dysfunction  Antibiotics:  Status post vancomycin/cefepime x1 dose on admission  IV Rocephin 7/20-7/22  Diflucan 7/14-7/21  HPI/Subjective: Awake and alert. States that breathing is still rough, although overall better.  Objective: Filed Vitals:   04/23/14 1540  BP: 105/57  Pulse: 76  Temp: 97.6 F (36.4 C)  Resp: 24    Intake/Output Summary (Last 24 hours) at 04/23/14 1629 Last data filed at 04/23/14 1600  Gross per 24 hour  Intake    650 ml  Output   1500 ml  Net   -850 ml   Filed Weights   04/21/14 0452 04/22/14 0426 04/23/14 0330  Weight: 135.3 kg (298 lb 4.5 oz) 136.8 kg (301 lb 9.4 oz) 130.2 kg (287 lb 0.6 oz)    Exam:   General:  Awake and alert, no acute distress  Cardiovascular: Irregular rhythm, rate controlled, soft 2/6 systolic ejection murmur appreciated  Respiratory: Decreased breath sounds secondary to body habitus  otherwise clear  Abdomen: Soft, obese, nontender, positive bowel sounds  Musculoskeletal: Lower extremities, wrapped bilaterally   Data  Reviewed: Basic Metabolic Panel:  Recent Labs Lab 04/19/14 0525 04/20/14 0420 04/21/14 0259 04/22/14 0244 04/23/14 0325  NA 138 140 139 142 144  K 3.9 3.4* 3.0* 3.2* 3.2*  CL 95* 94* 93* 95* 94*  CO2 30 33* 33* 36* 39*  GLUCOSE 127* 122* 115* 131* 116*  BUN 27* 28* 29* 28* 28*  CREATININE 1.41* 1.25* 1.06 0.93 0.83  CALCIUM 8.1* 8.3* 8.2* 8.4 8.8   Liver Function Tests:  Recent Labs Lab 04/22/14 1057  ALBUMIN 2.5*   No results found for this basename: LIPASE, AMYLASE,  in the last 168 hours No results found for this basename: AMMONIA,  in the last 168 hours CBC:  Recent Labs Lab 04/21/14 0259 04/22/14 0244  WBC 8.3 7.7  HGB 9.9* 9.8*  HCT 35.7* 35.2*  MCV 82.4 81.9  PLT 307 307   Cardiac Enzymes: No results found for this basename: CKTOTAL, CKMB, CKMBINDEX, TROPONINI,  in the last 168 hours BNP (last 3 results)  Recent Labs  04/05/2014 0931 04/21/14 0259  PROBNP 9984.0* 14034.0*   CBG:  Recent Labs Lab 04/22/14 1221 04/22/14 1729 04/22/14 2200 04/23/14 0856 04/23/14 1217  GLUCAP 130* 137* 130* 109* 129*       Studies: Dg Chest Port 1 View  04/22/2014   CLINICAL DATA:  Line placement.  EXAM: PORTABLE CHEST - 1 VIEW  COMPARISON:  04/20/2014  FINDINGS: The patient is rotated to the right. Right-sided PICC line has been placed with tip projecting at the expected level of the mid to lower SVC. Evaluation of the cardiac silhouette is limited by rotation and partial obscuration by parenchymal lung opacities. Widespread parenchymal lung opacities are again seen. The patient has taken a slightly greater inspiration with slightly improved aeration of the right lung base. Lung opacities otherwise do not appear significantly changed, with dense consolidation in the left lower lobe. Small bilateral pleural effusions are suspected. No definite pneumothorax is identified.  IMPRESSION: 1. Right PICC with tip at the level of the mid the lower SVC. 2. Slightly greater  inspiration with slightly improved aeration of the right lung base. Otherwise unchanged appearance of diffuse bilateral lung opacities with likely small bilateral pleural effusions.   Electronically Signed   By: Sebastian AcheAllen  Grady   On: 04/22/2014 17:59    Scheduled Meds: . antiseptic oral rinse  15 mL Mouth Rinse q12n4p  . buPROPion  300 mg Oral Daily  . cefTRIAXone (ROCEPHIN)  IV  1 g Intravenous Q24H  . chlorhexidine  15 mL Mouth Rinse BID  . docusate sodium  200 mg Oral BID  . fluconazole  100 mg Oral Daily  . insulin aspart  0-15 Units Subcutaneous TID WC  . insulin aspart  0-5 Units Subcutaneous QHS  . insulin glargine  20 Units Subcutaneous QHS  . metolazone  2.5 mg Oral BID  . potassium chloride  30 mEq Oral BID  . potassium chloride  40 mEq Oral Once  . simvastatin  20 mg Oral Daily  . sodium chloride  10-40 mL Intracatheter Q12H  . sodium chloride  3 mL Intravenous Q12H  . Warfarin - Pharmacist Dosing Inpatient   Does not apply q1800   Continuous Infusions: . furosemide (LASIX) infusion 10 mg/hr (04/23/14 1600)    Principal Problem:   Acute CHF (congestive heart failure) Active Problems:   Varicose veins of lower extremities with ulcer  Atrial fibrillation   Ulcers of both lower legs, chronic venous stasis   Diabetes mellitus type 2 in obese   Acute respiratory failure with hypoxia   CKD (chronic kidney disease), stage III   Diabetic nephropathy associated with diabetes mellitus due to underlying condition    Time spent: 25 minutes    Hollice Espy  [facility] Pager [phone]. If 7PM-7AM, please contact night-coverage at [email or web address], password [facility] 04/23/2014, 4:29 PM  LOS: 8 days

## 2014-04-23 NOTE — Progress Notes (Signed)
ANTICOAGULATION CONSULT NOTE - Follow Up Consult  Pharmacy Consult:  Coumadin Indication: atrial fibrillation  No Known Allergies  Patient Measurements: Height: 5\' 6"  (167.6 cm) Weight: 287 lb 0.6 oz (130.2 kg) (bed with pt, 1 sheet, 1 pillow, and nothing else) IBW/kg (Calculated) : 59.3  Vital Signs: Temp: 97.4 F (36.3 C) (07/21 0330) Temp src: Oral (07/21 0330) BP: 116/73 mmHg (07/21 0330) Pulse Rate: 56 (07/21 0530)  Recent Labs  04/21/14 0259 04/22/14 0244 04/23/14 0325  HGB 9.9* 9.8*  --   HCT 35.7* 35.2*  --   PLT 307 307  --   LABPROT 35.6* 41.9* 44.2*  INR 3.56* 4.39* 4.70*  CREATININE 1.06 0.93 0.83    Estimated Creatinine Clearance: 98.5 ml/min (by C-G formula based on Cr of 0.83).   Assessment: 63 yo female admitted with CHF exacerbation.  Pharmacy consulted to manage Coumadin for history of Afib.  INR supratherapeutic upon admission and remains supratherapeutic today at 4.7. Pt's last dose of warfarin was on 7/18.  Pt is also on fluconazole which can potentiate INR.  PTA warfarin dose: 5 mg po daily  Goal of Therapy:  INR 2-3   Plan:  1. Hold coumadin tonight 2. Daily PT/INR  3. Consider adding end date for ceftriaxone  4. Monitor closely for signs and symptoms of bleeding    Agapito GamesAlison Laniqua Torrens, PharmD, BCPS Clinical Pharmacist Pager: [phone] 04/23/2014 8:45 AM

## 2014-04-23 NOTE — Progress Notes (Signed)
Patient Name: Robin [name] Date of Encounter: 04/23/2014     Principal Problem:   Acute respiratory failure with hypoxia and hypercarbia Active Problems:   Varicose veins of lower extremities with ulcer   Atrial fibrillation   Ulcers of both lower legs, chronic venous stasis   Acute diastolic CHF (congestive heart failure)   Diabetes mellitus type 2 in obese   Acute respiratory failure with hypoxia   CKD (chronic kidney disease), stage III   Diabetic nephropathy associated with diabetes mellitus due to underlying condition   Morbid obesity    SUBJECTIVE  Denies any chest discomfort. A little SOB. Per nursing staff, still not urinating that much. Patient did not like BiPAP, switching between O2 mask to allow better tolerance   CURRENT MEDS . antiseptic oral rinse  15 mL Mouth Rinse q12n4p  . buPROPion  300 mg Oral Daily  . cefTRIAXone (ROCEPHIN)  IV  1 g Intravenous Q24H  . chlorhexidine  15 mL Mouth Rinse BID  . docusate sodium  200 mg Oral BID  . fluconazole  100 mg Oral Daily  . insulin aspart  0-15 Units Subcutaneous TID WC  . insulin aspart  0-5 Units Subcutaneous QHS  . insulin glargine  20 Units Subcutaneous QHS  . metolazone  2.5 mg Oral BID  . potassium chloride  30 mEq Oral BID  . simvastatin  20 mg Oral Daily  . sodium chloride  10-40 mL Intracatheter Q12H  . sodium chloride  3 mL Intravenous Q12H  . Warfarin - Pharmacist Dosing Inpatient   Does not apply q1800    OBJECTIVE  Filed Vitals:   04/23/14 0425 04/23/14 0530 04/23/14 0808 04/23/14 1219  BP:   115/46 127/65  Pulse: 70 56 59 86  Temp:   97.7 F (36.5 C) 97.1 F (36.2 C)  TempSrc:   Oral Oral  Resp: 26 19 24 24   Height:      Weight:      SpO2: 93% 97% 92% 90%    Intake/Output Summary (Last 24 hours) at 04/23/14 1256 Last data filed at 04/23/14 1221  Gross per 24 hour  Intake    530 ml  Output   1350 ml  Net   -820 ml   Filed Weights   04/21/14 0452 04/22/14 0426 04/23/14 0330    Weight: 298 lb 4.5 oz (135.3 kg) 301 lb 9.4 oz (136.8 kg) 287 lb 0.6 oz (130.2 kg)    PHYSICAL EXAM  General: Pleasant, NAD. Morbidly obese Neuro: Alert and oriented X 3. Moves all extremities spontaneously. Psych: Normal affect. HEENT:  Normal  Neck: Supple without bruits or JVD. Lungs:  Resp regular and unlabored, CTA. Heart: irregular no s3, s4, or murmurs. Abdomen: Soft, non-tender, non-distended, BS + x 4. Lower abdominal hernia noted Extremities: No clubbing, cyanosis. DP/PT/Radials 2+ and equal bilaterally. 1-2+ edema in B LE. LE covered in compression dressing  Accessory Clinical Findings  CBC  Recent Labs  04/21/14 0259 04/22/14 0244  WBC 8.3 7.7  HGB 9.9* 9.8*  HCT 35.7* 35.2*  MCV 82.4 81.9  PLT 307 307   Basic Metabolic Panel  Recent Labs  04/22/14 0244 04/23/14 0325  NA 142 144  K 3.2* 3.2*  CL 95* 94*  CO2 36* 39*  GLUCOSE 131* 116*  BUN 28* 28*  CREATININE 0.93 0.83  CALCIUM 8.4 8.8   Liver Function Tests  Recent Labs  04/22/14 1057  ALBUMIN 2.5*    TELE  A-fib with HR 60-70s  ECG  No new EKG  Radiology/Studies  Dg Chest 2 View  04/05/2014   CLINICAL DATA:  Shortness of breath, mean to RIGHT side, history hypertension, diabetes, atrial fibrillation, coronary artery disease  EXAM: CHEST  2 VIEW  COMPARISON:  03/16/2014  FINDINGS: Minimal enlargement of cardiac silhouette.  Pulmonary vascular congestion.  Small RIGHT pleural effusion.  New opacification of the RIGHT lower lobe compatible with pneumonia.  Associated volume loss in RIGHT hemi thorax.  Atelectasis versus consolidation in retrocardiac LEFT lower lobe as well.  No pneumothorax.  Bones demineralized.  IMPRESSION: Enlargement of cardiac silhouette with pulmonary vascular congestion.  BILATERAL new lower lobe consolidation compatible with pneumonia.  Small RIGHT pleural effusion.   Electronically Signed   By: Ulyses SouthwardMark  Boles M.D.   On: 04/04/2014 10:29   Dg Chest Port 1  View  04/22/2014   CLINICAL DATA:  Line placement.  EXAM: PORTABLE CHEST - 1 VIEW  COMPARISON:  04/20/2014  FINDINGS: The patient is rotated to the right. Right-sided PICC line has been placed with tip projecting at the expected level of the mid to lower SVC. Evaluation of the cardiac silhouette is limited by rotation and partial obscuration by parenchymal lung opacities. Widespread parenchymal lung opacities are again seen. The patient has taken a slightly greater inspiration with slightly improved aeration of the right lung base. Lung opacities otherwise do not appear significantly changed, with dense consolidation in the left lower lobe. Small bilateral pleural effusions are suspected. No definite pneumothorax is identified.  IMPRESSION: 1. Right PICC with tip at the level of the mid the lower SVC. 2. Slightly greater inspiration with slightly improved aeration of the right lung base. Otherwise unchanged appearance of diffuse bilateral lung opacities with likely small bilateral pleural effusions.   Electronically Signed   By: Sebastian AcheAllen  Grady   On: 04/22/2014 17:59   Dg Chest Port 1 View  04/20/2014   CLINICAL DATA:  Hypercarbic respiratory failure  EXAM: PORTABLE CHEST - 1 VIEW  COMPARISON:  04/04/2014  FINDINGS: Bilateral small pleural effusions. Bilateral interstitial and alveolar airspace opacities. No pneumothorax. Assessment of the cardiac silhouette is difficult secondary to the presence of pulmonary disease. Unremarkable osseous structures.  IMPRESSION: 1. Bilateral interstitial and alveolar airspace opacities with bilateral small pleural effusions. Differential diagnosis includes pulmonary edema versus multilobar pneumonia versus ARDS.   Electronically Signed   By: Elige KoHetal  Patel   On: 04/20/2014 21:31    ASSESSMENT AND PLAN  1. Acute on chronic respiratory insufficiency  - likely multifactorial with acute on chronic CHF, body habitus, lung volume, hypoalbuminemia and noncompliance with CPAP  - Pulm  critical care consulted, recommended aggressive diuresis, weight loss and CPAP  - continue lasix gtt diuresis, currently -6L net. No new recommendation from cardiac perspective - still urinating although reduced.   2. Acute on chronic diastolic HF  - Echo 7/13 EF 55-60%, unable to assess diastolic function  - transferred to step down due to hypoxia, on venturi mask  - CXR on 7/18 showed bilateral interstitial and alveolar airspace opacities with bilateral small pleural effusions. Differential diagnosis includes pulmonary edema versus multilobar pneumonia versus ARDS. - weight increasing despite diuresis, I/O -6L, however weight increased from 289 --> 301, ?accuracy of weight vs i/o  - worsening proBNP 9000 --> 14000  - metolazone added to lasix gtt along with albumin, renal function stable  3. Hypoalbuminemia - per Hospitalist  4. Diabetes  5. Hypertension - adequately controlled  6. Hyperlipidemia  7. Persistent atrial fibrillation -  rate controlled   - continue coumadin, off metoprolol due to hypotension  - INR continue to trend up, level may be affected by fluconazole 8. obstructive sleep apnea, noncompliant with CPAP  9. venous insufficiency  10. Hypokalemia - replete per hospitalist  11. Anemia 12. Hypotension  - BB stopped, PAF HR 60-70s, with no sign of tachycardia  - will leave BB off for now, once SBP come up, will add back 12. On Abx empirically for possible UTI   Signed, Amedeo Plenty Pager: [phone]  History and all data above reviewed.  Patient examined.  I agree with the findings as above. She might be breathing slightly better.   The patient exam reveals COR:RRR  ,  Lungs: Decreased breath sounds  ,  Abd: Positive bowel sounds, no rebound no guarding, Ext diffuse edema  .  All available labs, radiology testing, previous records reviewed. Agree with documented assessment and plan. Continue IV Lasix and daily Zaroxolyn as ceat and BP allow.  She will be wasting potassium  and so I will give an additional 40 meq now (total 100 meq today)   Rollene Rotunda  1:42 PM  04/23/2014

## 2014-04-23 NOTE — Clinical Social Work Note (Signed)
CSW continues to follow for d/c planning needs. CSW met with patient and provided bed offers. Patient accepted bed offer with [facility]. Tammy, clinical liasion for facility made aware. Per Tammy, facility needs updated PT/OT notes in order to obtain authorization from [facility]. CSW made RN aware of PT/OT consult. CSW to follow up with providing clinicals.   [place], [place] Weekend Clinical Social Worker [phone]

## 2014-04-24 ENCOUNTER — Inpatient Hospital Stay (HOSPITAL_COMMUNITY): Payer: BC Managed Care – PPO

## 2014-04-24 LAB — BLOOD GAS, ARTERIAL
Acid-Base Excess: 14.2 mmol/L — ABNORMAL HIGH (ref 0.0–2.0)
Bicarbonate: 39.6 mEq/L — ABNORMAL HIGH (ref 20.0–24.0)
Delivery systems: POSITIVE
Drawn by: 41934
Expiratory PAP: 6
FIO2: 0.6 %
Inspiratory PAP: 12
O2 Saturation: 92.3 %
Patient temperature: 98
RATE: 10 resp/min
TCO2: 41.6 mmol/L (ref 0–100)
pCO2 arterial: 63 mmHg (ref 35.0–45.0)
pH, Arterial: 7.413 (ref 7.350–7.450)
pO2, Arterial: 65.3 mmHg — ABNORMAL LOW (ref 80.0–100.0)

## 2014-04-24 LAB — GLUCOSE, CAPILLARY
GLUCOSE-CAPILLARY: 127 mg/dL — AB (ref 70–99)
GLUCOSE-CAPILLARY: 117 mg/dL — AB (ref 70–99)
GLUCOSE-CAPILLARY: 141 mg/dL — AB (ref 70–99)
Glucose-Capillary: 123 mg/dL — ABNORMAL HIGH (ref 70–99)
Glucose-Capillary: 114 mg/dL — ABNORMAL HIGH (ref 70–99)

## 2014-04-24 LAB — CBC
HEMATOCRIT: 39 % (ref 36.0–46.0)
Hemoglobin: 10.9 g/dL — ABNORMAL LOW (ref 12.0–15.0)
MCH: 23.3 pg — ABNORMAL LOW (ref 26.0–34.0)
MCHC: 27.9 g/dL — AB (ref 30.0–36.0)
MCV: 83.3 fL (ref 78.0–100.0)
Platelets: 408 10*3/uL — ABNORMAL HIGH (ref 150–400)
RBC: 4.68 MIL/uL (ref 3.87–5.11)
RDW: 21.4 % — AB (ref 11.5–15.5)
WBC: 13.8 10*3/uL — ABNORMAL HIGH (ref 4.0–10.5)

## 2014-04-24 LAB — BASIC METABOLIC PANEL
Anion gap: 12 (ref 5–15)
BUN: 28 mg/dL — ABNORMAL HIGH (ref 6–23)
CALCIUM: 9 mg/dL (ref 8.4–10.5)
CO2: 39 mEq/L — ABNORMAL HIGH (ref 19–32)
Chloride: 94 mEq/L — ABNORMAL LOW (ref 96–112)
Creatinine, Ser: 0.75 mg/dL (ref 0.50–1.10)
GFR calc Af Amer: >90 mL/min (ref >90)
GFR, EST NON AFRICAN AMERICAN: 90 mL/min — AB (ref >90)
GLUCOSE: 120 mg/dL — AB (ref 70–99)
Potassium: 3 mEq/L — ABNORMAL LOW (ref 3.7–5.3)
Sodium: 145 mEq/L (ref 137–147)

## 2014-04-24 LAB — PROTIME-INR
INR: 4.78 — ABNORMAL HIGH (ref 0.00–1.49)
Prothrombin Time: 44.8 seconds — ABNORMAL HIGH (ref 11.6–15.2)

## 2014-04-24 MED ORDER — POTASSIUM CHLORIDE CRYS ER 20 MEQ PO TBCR
80.0000 meq | EXTENDED_RELEASE_TABLET | Freq: Once | ORAL | Status: AC
  Administered 2014-04-24: 80 meq via ORAL

## 2014-04-24 MED ORDER — POTASSIUM CHLORIDE CRYS ER 20 MEQ PO TBCR
40.0000 meq | EXTENDED_RELEASE_TABLET | Freq: Two times a day (BID) | ORAL | Status: DC
  Administered 2014-04-24 – 2014-04-28 (×9): 40 meq via ORAL
  Filled 2014-04-24 (×13): qty 2

## 2014-04-24 NOTE — Discharge Instructions (Signed)

## 2014-04-24 NOTE — Clinical Social Work Note (Signed)
[facility] [place].   Roddie McBryant Betzabeth Derringer MSW, [place], [place], [phone]

## 2014-04-24 NOTE — Progress Notes (Signed)
ANTICOAGULATION CONSULT NOTE - Follow Up Consult  Pharmacy Consult:  Coumadin Indication: atrial fibrillation  No Known Allergies  Patient Measurements: Height: 5\' 6"  (167.6 cm) Weight: 283 lb 11.7 oz (128.7 kg) IBW/kg (Calculated) : 59.3  Vital Signs: Temp: 98.1 F (36.7 C) (07/22 0700) Temp src: Axillary (07/22 0700) BP: 128/57 mmHg (07/22 0700) Pulse Rate: 66 (07/22 0824)  Recent Labs  04/22/14 0244 04/23/14 0325 04/24/14 0405  HGB 9.8*  --   --   HCT 35.2*  --   --   PLT 307  --   --   LABPROT 41.9* 44.2* 44.8*  INR 4.39* 4.70* 4.78*  CREATININE 0.93 0.83 0.75    Estimated Creatinine Clearance: 101.5 ml/min (by C-G formula based on Cr of 0.75).   Assessment: 63 yo female admitted with CHF exacerbation.  Pharmacy consulted to manage Coumadin for history of Afib.  INR supratherapeutic upon admission and remains supratherapeutic today at 4.78. Pt's last dose of warfarin was on 7/18.  Supratherapeutic INR likely multifactorial; however, fluconazole has now been discontinued which should help normalize INR.  PTA warfarin dose: 5 mg po daily  Goal of Therapy:  INR 2-3   Plan:  1. Hold coumadin tonight 2. Daily PT/INR  3. Monitor closely for signs and symptoms of bleeding    Agapito GamesAlison Bralen Wiltgen, PharmD, BCPS Clinical Pharmacist Pager: [phone] 04/24/2014 11:41 AM

## 2014-04-24 NOTE — Progress Notes (Signed)
Placed pt on bipap due to increased WOB 

## 2014-04-24 NOTE — Progress Notes (Signed)
PROGRESS NOTE  Robin [name] ZOX:[id number] DOB: 10-18-1950 DOA: 04/29/2014 PCP: [name], MD  Assessment/Plan: Acute on chronic respiratory failure with hypoxemia and hypercarbia -Secondary to CHF exacerbation, obesity hypoventilation syndrome -Patient has not progressed -Increased work of breathing noted today on NRB -Consult pulmonary/critical care -Continue IV furosemide drip -CBC/chest x-ray -ABG on BiPAP 7.41/63/65/39 on 0.6 -Limit all hypnotic medications Acute on chronic diastolic CHF -Appreciate cardiology followup -Continue furosemide drip -Continue metolazone -Dry weight 111-113 kg -neg 8.2L since admission, neg 6.7 kg since admission Atrial fibrillation -Continue warfarin -Rate controlled Diabetes mellitus type 2 -Hemoglobin A1c 5.5 -Continue NovoLog sliding scale Pyuria -Continue day #3/5 ceftriaxone -Finished 7 days fluconazole Acute on chronic renal failure (CKD stage III) -Improved -Serum creatinine improving with diuresis Venous stasis dermatitis -Continue Unna boots Hypokalemia -Replete  Family Communication:   Pt at beside Disposition Plan:   Home when medically stable   Antibiotics:  Ceftriaxone 04/22/2014>>>      Procedures/Studies: Dg Chest 2 View  04/16/2014   CLINICAL DATA:  Shortness of breath, mean to RIGHT side, history hypertension, diabetes, atrial fibrillation, coronary artery disease  EXAM: CHEST  2 VIEW  COMPARISON:  03/16/2014  FINDINGS: Minimal enlargement of cardiac silhouette.  Pulmonary vascular congestion.  Small RIGHT pleural effusion.  New opacification of the RIGHT lower lobe compatible with pneumonia.  Associated volume loss in RIGHT hemi thorax.  Atelectasis versus consolidation in retrocardiac LEFT lower lobe as well.  No pneumothorax.  Bones demineralized.  IMPRESSION: Enlargement of cardiac silhouette with pulmonary vascular congestion.  BILATERAL new lower lobe consolidation compatible with  pneumonia.  Small RIGHT pleural effusion.   Electronically Signed   By: Ulyses Southward M.D.   On: 04/04/2014 10:29   Dg Chest Port 1 View  04/22/2014   CLINICAL DATA:  Line placement.  EXAM: PORTABLE CHEST - 1 VIEW  COMPARISON:  04/20/2014  FINDINGS: The patient is rotated to the right. Right-sided PICC line has been placed with tip projecting at the expected level of the mid to lower SVC. Evaluation of the cardiac silhouette is limited by rotation and partial obscuration by parenchymal lung opacities. Widespread parenchymal lung opacities are again seen. The patient has taken a slightly greater inspiration with slightly improved aeration of the right lung base. Lung opacities otherwise do not appear significantly changed, with dense consolidation in the left lower lobe. Small bilateral pleural effusions are suspected. No definite pneumothorax is identified.  IMPRESSION: 1. Right PICC with tip at the level of the mid the lower SVC. 2. Slightly greater inspiration with slightly improved aeration of the right lung base. Otherwise unchanged appearance of diffuse bilateral lung opacities with likely small bilateral pleural effusions.   Electronically Signed   By: Sebastian Ache   On: 04/22/2014 17:59   Dg Chest Port 1 View  04/20/2014   CLINICAL DATA:  Hypercarbic respiratory failure  EXAM: PORTABLE CHEST - 1 VIEW  COMPARISON:  04/13/2014  FINDINGS: Bilateral small pleural effusions. Bilateral interstitial and alveolar airspace opacities. No pneumothorax. Assessment of the cardiac silhouette is difficult secondary to the presence of pulmonary disease. Unremarkable osseous structures.  IMPRESSION: 1. Bilateral interstitial and alveolar airspace opacities with bilateral small pleural effusions. Differential diagnosis includes pulmonary edema versus multilobar pneumonia versus ARDS.   Electronically Signed   By: Elige Ko   On: 04/20/2014 21:31         Subjective: Patient complains of slight increased  shortness of breath  this morning. Denies any fevers, chills, chest discomfort, nausea, vomiting, diarrhea, abdominal pain. No dysuria.  Objective: Filed Vitals:   04/24/14 0400 04/24/14 0423 04/24/14 0700 04/24/14 0824  BP:  144/70 128/57   Pulse: 72 70 74 66  Temp:   98.1 F (36.7 C)   TempSrc:   Axillary   Resp: 21 24 29 24   Height:      Weight:      SpO2: 85% 95% 96% 95%    Intake/Output Summary (Last 24 hours) at 04/24/14 1148 Last data filed at 04/24/14 1100  Gross per 24 hour  Intake 1173.53 ml  Output   1875 ml  Net -701.47 ml   Weight change: -1.5 kg (-3 lb 4.9 oz) Exam:   General:  Pt is alert, follows commands appropriately, not in acute distress  HEENT: No icterus, No thrush,[place]/AT  Cardiovascular: IRRR, no rub  Respiratory: Bilateral crackles. No wheezing.  Abdomen: Soft/+BS, non tender, non distended, no guarding  Extremities: 2+ LE edema, bilateral LE in unna boots, no crepitance  Data Reviewed: Basic Metabolic Panel:  Recent Labs Lab 04/20/14 0420 04/21/14 0259 04/22/14 0244 04/23/14 0325 04/24/14 0405  NA 140 139 142 144 145  K 3.4* 3.0* 3.2* 3.2* 3.0*  CL 94* 93* 95* 94* 94*  CO2 33* 33* 36* 39* 39*  GLUCOSE 122* 115* 131* 116* 120*  BUN 28* 29* 28* 28* 28*  CREATININE 1.25* 1.06 0.93 0.83 0.75  CALCIUM 8.3* 8.2* 8.4 8.8 9.0   Liver Function Tests:  Recent Labs Lab 04/22/14 1057  ALBUMIN 2.5*   No results found for this basename: LIPASE, AMYLASE,  in the last 168 hours No results found for this basename: AMMONIA,  in the last 168 hours CBC:  Recent Labs Lab 04/21/14 0259 04/22/14 0244  WBC 8.3 7.7  HGB 9.9* 9.8*  HCT 35.7* 35.2*  MCV 82.4 81.9  PLT 307 307   Cardiac Enzymes: No results found for this basename: CKTOTAL, CKMB, CKMBINDEX, TROPONINI,  in the last 168 hours BNP: No components found with this basename: POCBNP,  CBG:  Recent Labs Lab 04/23/14 0856 04/23/14 1217 04/23/14 1752 04/23/14 2153  04/24/14 0813  GLUCAP 109* 129* 126* 123* 114*    Recent Results (from the past 240 hour(s))  URINE CULTURE     Status: None   Collection Time    04/14/2014  9:06 AM      Result Value Ref Range Status   Specimen Description URINE, CATHETERIZED   Final   Special Requests NONE   Final   Culture  Setup Time     Final   Value: 04/04/2014 15:37     Performed at [facility]   Colony Count     Final   Value: 8,000 COLONIES/ML     [facility]formed at [facility]   Culture     Final   Value: INSIGNIFICANT GROWTH     Performed at [facility]   Report Status 04/16/2014 FINAL   Final  CULTURE, BLOOD (ROUTINE X 2)     Status: None   Collection Time    04/10/2014  1:50 PM      Result Value Ref Range Status   Specimen Description Blood   Final   Special Requests NONE   Final   Culture  Setup Time     Final   Value: 04/11/2014 19:23     Performed at [facility]   Culture     Final   Value:  NO GROWTH 5 DAYS     Performed at [facility]   Report Status 04/21/2014 FINAL   Final  CULTURE, BLOOD (ROUTINE X 2)     Status: None   Collection Time    04/25/2014  2:00 PM      Result Value Ref Range Status   Specimen Description Blood   Final   Special Requests NONE   Final   Culture  Setup Time     Final   Value: 04/06/2014 19:23     Performed at [facility]   Culture     Final   Value: NO GROWTH 5 DAYS     Performed at [facility]   Report Status 04/21/2014 FINAL   Final     Scheduled Meds: . antiseptic oral rinse  15 mL Mouth Rinse q12n4p  . buPROPion  300 mg Oral Daily  . cefTRIAXone (ROCEPHIN)  IV  1 g Intravenous Q24H  . chlorhexidine  15 mL Mouth Rinse BID  . docusate sodium  200 mg Oral BID  . insulin aspart  0-15 Units Subcutaneous TID WC  . insulin aspart  0-5 Units Subcutaneous QHS  . insulin glargine  20 Units Subcutaneous QHS  . metolazone  2.5 mg Oral BID  . potassium chloride  40 mEq Oral Once  . potassium chloride  40  mEq Oral BID  . simvastatin  20 mg Oral Daily  . sodium chloride  10-40 mL Intracatheter Q12H  . sodium chloride  3 mL Intravenous Q12H  . Warfarin - Pharmacist Dosing Inpatient   Does not apply q1800   Continuous Infusions: . furosemide (LASIX) infusion 12 mg/hr (04/24/14 1100)     Maricus Tanzi, DO  [facility] Pager [phone]  If 7PM-7AM, please contact night-coverage [email or web address] Password [email or web address] 04/24/2014, 11:48 AM   LOS: 9 days

## 2014-04-24 NOTE — Progress Notes (Signed)
SUBJECTIVE:  Breathing somewhat better.     PHYSICAL EXAM Filed Vitals:   04/24/14 0400 04/24/14 0423 04/24/14 0700 04/24/14 0824  BP:  144/70 128/57   Pulse: 72 70 74 66  Temp:   98.1 F (36.7 C)   TempSrc:   Axillary   Resp: 21 24 29 24   Height:      Weight:      SpO2: 85% 95% 96% 95%   General:  No distress Lungs:  Decreased breath sounds Heart:  Irregular Abdomen:  Positive bowel sounds, no rebound no guarding Extremities:  Diffuse edema   LABS:  Results for orders placed during the hospital encounter of 04/28/2014 (from the past 24 hour(s))  GLUCOSE, CAPILLARY     Status: Abnormal   Collection Time    04/23/14 12:17 PM      Result Value Ref Range   Glucose-Capillary 129 (*) 70 - 99 mg/dL   Comment 1 Documented in Chart     Comment 2 Notify RN    GLUCOSE, CAPILLARY     Status: Abnormal   Collection Time    04/23/14  5:52 PM      Result Value Ref Range   Glucose-Capillary 126 (*) 70 - 99 mg/dL  GLUCOSE, CAPILLARY     Status: Abnormal   Collection Time    04/23/14  9:53 PM      Result Value Ref Range   Glucose-Capillary 123 (*) 70 - 99 mg/dL  PROTIME-INR     Status: Abnormal   Collection Time    04/24/14  4:05 AM      Result Value Ref Range   Prothrombin Time 44.8 (*) 11.6 - 15.2 seconds   INR 4.78 (*) 0.00 - 1.49  BASIC METABOLIC PANEL     Status: Abnormal   Collection Time    04/24/14  4:05 AM      Result Value Ref Range   Sodium 145  137 - 147 mEq/L   Potassium 3.0 (*) 3.7 - 5.3 mEq/L   Chloride 94 (*) 96 - 112 mEq/L   CO2 39 (*) 19 - 32 mEq/L   Glucose, Bld 120 (*) 70 - 99 mg/dL   BUN 28 (*) 6 - 23 mg/dL   Creatinine, Ser 1.610.75  0.50 - 1.10 mg/dL   Calcium 9.0  8.4 - 09.610.5 mg/dL   GFR calc non Af Amer 90 (*) >90 mL/min   GFR calc Af Amer >90  >90 mL/min   Anion gap 12  5 - 15  BLOOD GAS, ARTERIAL     Status: Abnormal   Collection Time    04/24/14  5:40 AM      Result Value Ref Range   FIO2 0.60     Delivery systems BILEVEL POSITIVE AIRWAY  PRESSURE     Rate 10     Inspiratory PAP 12     Expiratory PAP 6     pH, Arterial 7.413  7.350 - 7.450   pCO2 arterial 63.0 (*) 35.0 - 45.0 mmHg   pO2, Arterial 65.3 (*) 80.0 - 100.0 mmHg   Bicarbonate 39.6 (*) 20.0 - 24.0 mEq/L   TCO2 41.6  0 - 100 mmol/L   Acid-Base Excess 14.2 (*) 0.0 - 2.0 mmol/L   O2 Saturation 92.3     Patient temperature 98.0     Collection site LEFT RADIAL     Drawn by [id number]     Sample type ARTERIAL     Allens test (pass/fail) PASS  PASS  GLUCOSE, CAPILLARY     Status: Abnormal   Collection Time    04/24/14  8:13 AM      Result Value Ref Range   Glucose-Capillary 114 (*) 70 - 99 mg/dL   Comment 1 Documented in Chart     Comment 2 Notify RN      Intake/Output Summary (Last 24 hours) at 04/24/14 0859 Last data filed at 04/24/14 0800  Gross per 24 hour  Intake    740 ml  Output   2025 ml  Net  -1285 ml    ASSESSMENT AND PLAN:  ACUTE ON CHRONIC SYSTOLIC AND DIASTOLIC HF:   Increase Lasix drip to 12 mg per hour.  For some reason she did not get her additional potassium that I ordered yesterday.  Today I will give her a total of 160 mg.  Continue Zaroxolyn.    ATRIAL FIB:  Continue current meds.   Rollene Rotunda 04/24/2014 8:59 AM

## 2014-04-24 NOTE — Consult Note (Signed)
Name: Robin [name] MRN: [id number] DOB: [birth year]    ADMISSION DATE:  04/09/2014 CONSULTATION DATE:  04/24/2014  REFERRING MD :  Tat PRIMARY SERVICE:  TRH  CHIEF COMPLAINT:  SOB  BRIEF PATIENT DESCRIPTION: [name] with acute on chronic respiratory failure, OSA/OHS, chronic diastolic heart failure, admitted 7/13 for SOB.  Has had persistent SOB despite BiPAP and diuresis, PCCM consulted.  SIGNIFICANT EVENTS / STUDIES:  Echo 7/13 >>> EF 55-60%, diastolic fxn unable to be evaluated due to afib.  LINES / TUBES: Right PICC 7/20 >>>  CULTURES: Blood 7/13 >>> neg Urine 7/13 >>> neg  ANTIBIOTICS: Ceftriaxone 7/20 >>> Diflucan 7/14 >>> 7/21 Vanc 7/13 >>> 7/21 Cefepime 7/13 >>> 7/14   HISTORY OF PRESENT ILLNESS:  Mrs. Robin [name] is a 63 y.o. F with PMH as outlined below.  She presented to the ED on 7/13 for SOB and was admitted for pulmonary edema secondary to CHF exacerbation as well as CXR findings suggestive of PNA.  She was treated with diuresis and abx. On 7/18 pt had deterioration in respiratory status, ABG revealed respiratory acidosis and pt transferred to SDU and placed on BiPAP. On 7/19, Pulmonology was consulted for acute on chronic respiratory failure and abnormal chest xray.  Pt's condition thought to be secondary to decompensated diastolic heart failure and pt had no signs/symptoms to suggest underlying infection.  Suggestions were made to continue with aggressive diuresis and BiPAP support nocturnally and PRN.  Pulmonology had signed off; however, were reconsulted on 7/22 for continued SOB. She denies fevers/chills/sweats, chest pain, N/V/D, abdominal pain.  PAST MEDICAL HISTORY :  Past Medical History  Diagnosis Date  . Diabetes mellitus   . Hypertension   . Hyperlipidemia   . Atrial fibrillation   . Sleep apnea   . Cholelithiasis   . CAD (coronary artery disease)     atrial fibrillation  . Shortness of breath   . Chronic kidney disease  04/26/2014    ACTUE RENAL FAILURE   Past Surgical History  Procedure Laterality Date  . Tonsillectomy  1970s  . Hemorroidectomy  1990s  . Choledochal cyst excision  1980s  . Parathyroidectomy    . Endovenous ablation saphenous vein w/ laser  08-02-2011  Right greater saphenous vein   Prior to Admission medications   Medication Sig Start Date End Date Taking? Authorizing Provider  ALPRAZolam Prudy Feeler) 0.5 MG tablet Take 1 tablet (0.5 mg total) by mouth 3 (three) times daily. 04/05/14  Yes Daniel J Angiulli, PA-C  bethanechol (URECHOLINE) 25 MG tablet Take 1 tablet (25 mg total) by mouth at bedtime. 04/05/14  Yes Daniel J Angiulli, PA-C  buPROPion (WELLBUTRIN XL) 300 MG 24 hr tablet Take 1 tablet (300 mg total) by mouth daily. 04/05/14  Yes Daniel J Angiulli, PA-C  furosemide (LASIX) 40 MG tablet Take 1 tablet (40 mg total) by mouth daily. 04/05/14  Yes Daniel J Angiulli, PA-C  HYDROcodone-acetaminophen (NORCO/VICODIN) 5-325 MG per tablet Take 1 tablet by mouth every 4 (four) hours as needed for moderate pain. 04/05/14  Yes Daniel J Angiulli, PA-C  insulin glargine (LANTUS) 100 UNIT/ML injection Inject 0.3 mLs (30 Units total) into the skin at bedtime. 04/05/14  Yes Daniel J Angiulli, PA-C  nystatin (MYCOSTATIN/NYSTOP) 100000 UNIT/GM POWD Apply 1 g topically every evening.   Yes Historical Provider, MD  simvastatin (ZOCOR) 20 MG tablet Take 1 tablet (20 mg total) by mouth daily. 04/05/14  Yes Daniel J Angiulli, PA-C  sodium bicarbonate 650 MG tablet  Take 1 tablet (650 mg total) by mouth 3 (three) times daily. 04/05/14  Yes Daniel J Angiulli, PA-C  warfarin (COUMADIN) 5 MG tablet Take 5 mg by mouth daily at 6 PM. Take as directed by coumadin clinic   Yes Historical Provider, MD   No Known Allergies  FAMILY HISTORY:  Family History  Problem Relation Age of Onset  . Hypertension Mother   . Heart disease Father [id number]    MI  . COPD Father   . Other Brother     heart issues  . Heart disease Brother   . Other  Brother     heart issues  . Heart disease Brother   . Drug abuse Brother    SOCIAL HISTORY:  reports that she has never smoked. She has never used smokeless tobacco. She reports that she does not drink alcohol or use illicit drugs.  REVIEW OF SYSTEMS:   All negative; except for those that are bolded, which indicate positives.  Constitutional: weight loss, weight gain, night sweats, fevers, chills, fatigue, weakness.  HEENT: headaches, sore throat, sneezing, nasal congestion, post nasal drip, difficulty swallowing, tooth/dental problems, visual complaints, visual changes, ear aches. Neuro: difficulty with speech, weakness, numbness, ataxia. CV:  chest pain, orthopnea, PND, swelling in lower extremities, dizziness, palpitations, syncope.  Resp: cough, hemoptysis, dyspnea, wheezing. GI  heartburn, indigestion, abdominal pain, nausea, vomiting, diarrhea, constipation, change in bowel habits, loss of appetite, hematemesis, melena, hematochezia.  GU: dysuria, change in color of urine, urgency or frequency, flank pain, hematuria. MSK: joint pain or swelling, decreased range of motion. Psych: change in mood or affect, depression, anxiety, suicidal ideations, homicidal ideations. Skin: rash, itching, bruising.   SUBJECTIVE:   VITAL SIGNS: Temp:  [97.6 F (36.4 C)-98.3 F (36.8 C)] 98.1 F (36.7 C) (07/22 0700) Pulse Rate:  [65-85] 66 (07/22 0824) Resp:  [21-42] 24 (07/22 0824) BP: (105-144)/(57-98) 128/57 mmHg (07/22 0700) SpO2:  [85 %-98 %] 95 % (07/22 0824) FiO2 (%):  [50 %-100 %] 100 % (07/22 0824) Weight:  [128.7 kg (283 lb 11.7 oz)] 128.7 kg (283 lb 11.7 oz) (07/22 0340)  PHYSICAL EXAMINATION: General: Morbidly obese female, sitting up in bed eating, in NAD. Neuro: A&O x 3, non-focal.  HEENT: [place]/AT. PERRL, sclerae anicteric. Cardiovascular: IRIR, no M/R/G appreciated with respect to body habitus. Lungs: Respirations shallow but unlabored. CTA bilaterally, No W/R/R.  Abdomen: BS  x 4, soft, NT/ND.  Musculoskeletal: Unna boots in place bilateral LE's, 2+ edema. Skin: Intact, warm, no rashes.     Recent Labs Lab 04/22/14 0244 04/23/14 0325 04/24/14 0405  NA 142 144 145  K 3.2* 3.2* 3.0*  CL 95* 94* 94*  CO2 36* 39* 39*  BUN 28* 28* 28*  CREATININE 0.93 0.83 0.75  GLUCOSE 131* 116* 120*    Recent Labs Lab 04/21/14 0259 04/22/14 0244  HGB 9.9* 9.8*  HCT 35.7* 35.2*  WBC 8.3 7.7  PLT 307 307   Dg Chest Port 1 View  04/24/2014   CLINICAL DATA:  Acute respiratory failure  EXAM: PORTABLE CHEST - 1 VIEW  COMPARISON:  04/22/2014  FINDINGS: Cardiomegaly again noted. Study is limited by patient's large body habitus. Again noted diffuse bilateral lung opacities. Stable consolidation left lower lobe. Stable right Port-A-Cath position. Small bilateral pleural effusion.  IMPRESSION: No significant change. Again noted diffuse bilateral lung opacities and left lower lobe consolidation. Small bilateral pleural effusion.   Electronically Signed   By: Natasha MeadLiviu  Pop M.D.   On: 04/24/2014 12:24  Dg Chest Port 1 View  04/22/2014   CLINICAL DATA:  Line placement.  EXAM: PORTABLE CHEST - 1 VIEW  COMPARISON:  04/20/2014  FINDINGS: The patient is rotated to the right. Right-sided PICC line has been placed with tip projecting at the expected level of the mid to lower SVC. Evaluation of the cardiac silhouette is limited by rotation and partial obscuration by parenchymal lung opacities. Widespread parenchymal lung opacities are again seen. The patient has taken a slightly greater inspiration with slightly improved aeration of the right lung base. Lung opacities otherwise do not appear significantly changed, with dense consolidation in the left lower lobe. Small bilateral pleural effusions are suspected. No definite pneumothorax is identified.  IMPRESSION: 1. Right PICC with tip at the level of the mid the lower SVC. 2. Slightly greater inspiration with slightly improved aeration of the  right lung base. Otherwise unchanged appearance of diffuse bilateral lung opacities with likely small bilateral pleural effusions.   Electronically Signed   By: Sebastian Ache   On: 04/22/2014 17:59    ASSESSMENT / PLAN:  Acute hypercarbic and hypoxic respiratory failure OSA / OHS Pulmonary edema Pleural effusions RECS: - Alternate BiPAP with NRB x 4hr cycles. - Continue lasix ggt. - Continue metolazone. - Chest Xray in AM.   Rutherford Guys, PA - [facility] Pgr: [phone]  or [phone]  Attending note- refractory acute diastolic heart failure. In spite of what appears to be adequate diureses, with high for oxygen requirement. Wonder if we need to consider other etiologies such as acute lung injury although aspiration seems unlikely.  Oretha Milch  MD  04/24/2014, 1:07 PM

## 2014-04-25 ENCOUNTER — Inpatient Hospital Stay (HOSPITAL_COMMUNITY): Payer: BC Managed Care – PPO

## 2014-04-25 LAB — URINE MICROSCOPIC-ADD ON

## 2014-04-25 LAB — BASIC METABOLIC PANEL
ANION GAP: 12 (ref 5–15)
BUN: 30 mg/dL — ABNORMAL HIGH (ref 6–23)
CALCIUM: 8.9 mg/dL (ref 8.4–10.5)
CO2: 39 meq/L — AB (ref 19–32)
Chloride: 96 mEq/L (ref 96–112)
Creatinine, Ser: 0.78 mg/dL (ref 0.50–1.10)
GFR calc Af Amer: >90 mL/min (ref >90)
GFR calc non Af Amer: 88 mL/min — ABNORMAL LOW (ref >90)
Glucose, Bld: 114 mg/dL — ABNORMAL HIGH (ref 70–99)
Potassium: 3.4 mEq/L — ABNORMAL LOW (ref 3.7–5.3)
SODIUM: 147 meq/L (ref 137–147)

## 2014-04-25 LAB — URINALYSIS, ROUTINE W REFLEX MICROSCOPIC
GLUCOSE, UA: NEGATIVE mg/dL
KETONES UR: 15 mg/dL — AB
Nitrite: NEGATIVE
PH: 5 (ref 5.0–8.0)
PROTEIN: 30 mg/dL — AB
Specific Gravity, Urine: 1.015 (ref 1.005–1.030)
Urobilinogen, UA: 0.2 mg/dL (ref 0.0–1.0)

## 2014-04-25 LAB — GLUCOSE, CAPILLARY
GLUCOSE-CAPILLARY: 116 mg/dL — AB (ref 70–99)
Glucose-Capillary: 113 mg/dL — ABNORMAL HIGH (ref 70–99)
Glucose-Capillary: 123 mg/dL — ABNORMAL HIGH (ref 70–99)
Glucose-Capillary: 143 mg/dL — ABNORMAL HIGH (ref 70–99)

## 2014-04-25 LAB — PROTIME-INR
INR: 5.39 — AB (ref 0.00–1.49)
Prothrombin Time: 49.2 seconds — ABNORMAL HIGH (ref 11.6–15.2)

## 2014-04-25 LAB — HEMOGLOBIN AND HEMATOCRIT, BLOOD
HCT: 38.8 % (ref 36.0–46.0)
HEMOGLOBIN: 10.5 g/dL — AB (ref 12.0–15.0)

## 2014-04-25 MED ORDER — POTASSIUM CHLORIDE CRYS ER 20 MEQ PO TBCR
20.0000 meq | EXTENDED_RELEASE_TABLET | Freq: Once | ORAL | Status: AC
  Administered 2014-04-25: 20 meq via ORAL

## 2014-04-25 MED ORDER — GLUCERNA SHAKE PO LIQD
237.0000 mL | Freq: Three times a day (TID) | ORAL | Status: DC
  Administered 2014-04-25 – 2014-04-30 (×4): 237 mL via ORAL

## 2014-04-25 NOTE — Progress Notes (Signed)
I continue to follow pt's progression medically and functionally with attempts with therapy. [phone]

## 2014-04-25 NOTE — Progress Notes (Signed)
Name: Robin [name] MRN: [id number] DOB: 06/25/1951    ADMISSION DATE:  04/12/2014 CONSULTATION DATE:  04/24/2014  REFERRING MD :  Tat PRIMARY SERVICE:  TRH  CHIEF COMPLAINT:  SOB  BRIEF PATIENT DESCRIPTION: [name] with acute on chronic respiratory failure, OSA/OHS, chronic diastolic heart failure, admitted 7/13 for SOB.  Had persistent SOB despite BiPAP and diuresis, PCCM consulted.  SIGNIFICANT EVENTS / STUDIES:  Echo 7/13 >>> EF 55-60%, diastolic fxn unable to be evaluated due to afib.  LINES / TUBES: Right PICC 7/20 >>>  CULTURES: Blood 7/13 >>> neg Urine 7/13 >>> neg  ANTIBIOTICS: Ceftriaxone 7/20 >>> 7/22 Diflucan 7/14 >>> 7/21 Vanc 7/13 >>> 7/21 Cefepime 7/13 >>> 7/14   INTERVAL HISTORY: Rotated BiPAP and NRB throughout the night as instructed.  She reports she is breathing easier this AM.  Feels better with BiPAP on.  Have encouraged her to continue with BiPAP as able.  VITAL SIGNS: Temp:  [96.9 F (36.1 C)-97.8 F (36.6 C)] 97.8 F (36.6 C) (07/23 0816) Pulse Rate:  [68-91] 83 (07/23 0816) Resp:  [24-40] 40 (07/23 0816) BP: (105-138)/(50-65) 138/60 mmHg (07/23 0816) SpO2:  [89 %-98 %] 89 % (07/23 0816) FiO2 (%):  [60 %] 60 % (07/23 0504) Weight:  [133.7 kg (294 lb 12.1 oz)] 133.7 kg (294 lb 12.1 oz) (07/23 0504)  PHYSICAL EXAMINATION: General: Morbidly obese female, resting in bed on NRB, in NAD. Neuro: A&O x 3, non-focal.  HEENT: [place]/AT. PERRL, sclerae anicteric. Cardiovascular: IRIR, no M/R/G appreciated with respect to body habitus. Lungs: Respirations shallow but unlabored. CTA bilaterally, No W/R/R.  Abdomen: BS x 4, soft, NT/ND.  Musculoskeletal: Unna boots in place bilateral LE's, 2+ edema. Skin: Intact, warm, no rashes.     Recent Labs Lab 04/23/14 0325 04/24/14 0405 04/25/14 0500  NA 144 145 147  K 3.2* 3.0* 3.4*  CL 94* 94* 96  CO2 39* 39* 39*  BUN 28* 28* 30*  CREATININE 0.83 0.75 0.78  GLUCOSE 116* 120*  114*    Recent Labs Lab 04/21/14 0259 04/22/14 0244 04/24/14 1200  HGB 9.9* 9.8* 10.9*  HCT 35.7* 35.2* 39.0  WBC 8.3 7.7 13.8*  PLT 307 307 408*   Dg Chest Port 1 View  04/25/2014   CLINICAL DATA:  Airspace disease  EXAM: PORTABLE CHEST - 1 VIEW  COMPARISON:  Yesterday  FINDINGS: Stable right PICC. Stable diffuse airspace disease within the right lung and stable small right pleural effusion. Diminished opacity in the left lung compatible with a combination of worsening airspace disease and pleural fluid. No pneumothorax. Normal heart size.  IMPRESSION: Stable right pleural effusion and right airspace disease.  Improved left airspace disease and left pleural effusion.   Electronically Signed   By: Maryclare BeanArt  Hoss M.D.   On: 04/25/2014 08:05   Dg Chest Port 1 View  04/24/2014   CLINICAL DATA:  Acute respiratory failure  EXAM: PORTABLE CHEST - 1 VIEW  COMPARISON:  04/22/2014  FINDINGS: Cardiomegaly again noted. Study is limited by patient's large body habitus. Again noted diffuse bilateral lung opacities. Stable consolidation left lower lobe. Stable right Port-A-Cath position. Small bilateral pleural effusion.  IMPRESSION: No significant change. Again noted diffuse bilateral lung opacities and left lower lobe consolidation. Small bilateral pleural effusion.   Electronically Signed   By: Natasha MeadLiviu  Pop M.D.   On: 04/24/2014 12:24    ASSESSMENT / PLAN:  Acute hypercarbic and hypoxic respiratory failure OSA / OHS Pulmonary edema Pleural effusions  Refractory acute on chronic diastolic heart failure RECS: - Alternate BiPAP with NRB x 4hr cycles.  Have encouraged her to try and progress to less time on NRB (ie 4hrs BiPAP with 2hrs NRB) - Continue lasix ggt. - Continue metolazone. - Have asked RN to attempt and move pt / dangle on edge of bed.  Rutherford Guys, PA - [facility] Pgr: [phone]  or [phone]  Diureses will be paramount here, there is no role  for thoracentesis at this point as pleural effusions unless massive should not affect hypoxemia.  So continue diureses as renal function will allow.  Addition of albumin may be a consideration here but will defer to primary.  PCCM will continue to follow.  Patient seen and examined, agree with above note.  I dictated the care and orders written for this patient under my direction.  Alyson Reedy, MD [phone]

## 2014-04-25 NOTE — Progress Notes (Signed)
PROGRESS NOTE  Robin [name] UXL:[id number] DOB: 24-Mar-1951 DOA: 04/17/2014 PCP: [name], MD  Assessment/Plan: Acute on chronic respiratory failure with hypoxemia and hypercarbia  -Secondary to CHF exacerbation, obesity hypoventilation syndrome  -Patient progressing slowly-->feeling a little better today -Increased work of breathing noted today on NRB  -Appreciate pulmonary/critical care--rec 4hrs BiPAP, 2 hrs NRB alternating  -Continue IV furosemide drip  -7/23 chest x-ray-stable right pleural effusion, slight decreased left lung opacities -ABG on BiPAP 7.41/63/65/39 on 0.6  -Limit all hypnotic medications  -Recheck CBC--> if WBC continues to rise may need empirically to treat for HCAP -04/13/2014 echocardiogram EF 55-60%, unable to evaluate diastolic function Acute on chronic diastolic CHF  -Appreciate cardiology followup  -Continue furosemide drip  -Continue metolazone  -Dry weight 111-113 kg  -neg 9.5L since admission;  Weights questionable accuracy Atrial fibrillation  -Continue warfarin  -Rate controlled  Diabetes mellitus type 2  -Hemoglobin A1c 5.5  -Continue NovoLog sliding scale -Continue Lantus 20 units daily -CBG is controlled  Pyuria  -finished 3 days of ceftriaxone -Finished 7 days fluconazole  Acute on chronic renal failure (CKD stage III)  -Improved  -Serum creatinine improving with diuresis  Venous stasis dermatitis  -Continue Unna boots  Hypokalemia  -Replete  Family Communication: Pt at beside  Disposition Plan: Home when medically stable   Procedures:  PICC 04/22/14      Procedures/Studies: Dg Chest 2 View  04/13/2014   CLINICAL DATA:  Shortness of breath, mean to RIGHT side, history hypertension, diabetes, atrial fibrillation, coronary artery disease  EXAM: CHEST  2 VIEW  COMPARISON:  03/16/2014  FINDINGS: Minimal enlargement of cardiac silhouette.  Pulmonary vascular congestion.  Small RIGHT pleural effusion.  New  opacification of the RIGHT lower lobe compatible with pneumonia.  Associated volume loss in RIGHT hemi thorax.  Atelectasis versus consolidation in retrocardiac LEFT lower lobe as well.  No pneumothorax.  Bones demineralized.  IMPRESSION: Enlargement of cardiac silhouette with pulmonary vascular congestion.  BILATERAL new lower lobe consolidation compatible with pneumonia.  Small RIGHT pleural effusion.   Electronically Signed   By: Ulyses Southward M.D.   On: 04/18/2014 10:29   Dg Chest Port 1 View  04/25/2014   CLINICAL DATA:  Airspace disease  EXAM: PORTABLE CHEST - 1 VIEW  COMPARISON:  Yesterday  FINDINGS: Stable right PICC. Stable diffuse airspace disease within the right lung and stable small right pleural effusion. Diminished opacity in the left lung compatible with a combination of worsening airspace disease and pleural fluid. No pneumothorax. Normal heart size.  IMPRESSION: Stable right pleural effusion and right airspace disease.  Improved left airspace disease and left pleural effusion.   Electronically Signed   By: Maryclare Bean M.D.   On: 04/25/2014 08:05   Dg Chest Port 1 View  04/24/2014   CLINICAL DATA:  Acute respiratory failure  EXAM: PORTABLE CHEST - 1 VIEW  COMPARISON:  04/22/2014  FINDINGS: Cardiomegaly again noted. Study is limited by patient's large body habitus. Again noted diffuse bilateral lung opacities. Stable consolidation left lower lobe. Stable right Port-A-Cath position. Small bilateral pleural effusion.  IMPRESSION: No significant change. Again noted diffuse bilateral lung opacities and left lower lobe consolidation. Small bilateral pleural effusion.   Electronically Signed   By: Natasha Mead M.D.   On: 04/24/2014 12:24   Dg Chest Port 1 View  04/22/2014   CLINICAL DATA:  Line placement.  EXAM: PORTABLE CHEST - 1 VIEW  COMPARISON:  04/20/2014  FINDINGS: The patient is rotated to the right. Right-sided PICC line has been placed with tip projecting at the expected level of the mid to  lower SVC. Evaluation of the cardiac silhouette is limited by rotation and partial obscuration by parenchymal lung opacities. Widespread parenchymal lung opacities are again seen. The patient has taken a slightly greater inspiration with slightly improved aeration of the right lung base. Lung opacities otherwise do not appear significantly changed, with dense consolidation in the left lower lobe. Small bilateral pleural effusions are suspected. No definite pneumothorax is identified.  IMPRESSION: 1. Right PICC with tip at the level of the mid the lower SVC. 2. Slightly greater inspiration with slightly improved aeration of the right lung base. Otherwise unchanged appearance of diffuse bilateral lung opacities with likely small bilateral pleural effusions.   Electronically Signed   By: Sebastian Ache   On: 04/22/2014 17:59   Dg Chest Port 1 View  04/20/2014   CLINICAL DATA:  Hypercarbic respiratory failure  EXAM: PORTABLE CHEST - 1 VIEW  COMPARISON:  04/07/2014  FINDINGS: Bilateral small pleural effusions. Bilateral interstitial and alveolar airspace opacities. No pneumothorax. Assessment of the cardiac silhouette is difficult secondary to the presence of pulmonary disease. Unremarkable osseous structures.  IMPRESSION: 1. Bilateral interstitial and alveolar airspace opacities with bilateral small pleural effusions. Differential diagnosis includes pulmonary edema versus multilobar pneumonia versus ARDS.   Electronically Signed   By: Elige Ko   On: 04/20/2014 21:31         Subjective: Patient states that she is breathing slightly better today. She is breathing better when she is on BiPAP. Denies any air hunger. Denies any fevers, chills, chest pain, vomiting, nausea, vomiting, diarrhea, headaches.   Objective: Filed Vitals:   04/25/14 0504 04/25/14 0746 04/25/14 0816 04/25/14 1200  BP: 129/50  138/60 127/73  Pulse: 75 91 83 94  Temp: 96.9 F (36.1 C)  97.8 F (36.6 C) 97.5 F (36.4 C)  TempSrc:  Axillary  Axillary Axillary  Resp: 24 28 40 34  Height: 5\' 6"  (1.676 m)     Weight: 133.7 kg (294 lb 12.1 oz)     SpO2: 96%  89% 93%    Intake/Output Summary (Last 24 hours) at 04/25/14 1418 Last data filed at 04/25/14 1202  Gross per 24 hour  Intake    168 ml  Output   1575 ml  Net  -1407 ml   Weight change: 5 kg (11 lb 0.4 oz) Exam:   General:  Pt is alert, follows commands appropriately, not in acute distress  HEENT: No icterus, No thrush, /AT  Cardiovascular: IRRR, S1/S2, no rubs, no gallops  Respiratory: Bilateral crackles. No wheezing.   Abdomen: Soft/+BS, non tender, non distended, no guarding  Extremities: Unna boots on b/l LE  Data Reviewed: Basic Metabolic Panel:  Recent Labs Lab 04/21/14 0259 04/22/14 0244 04/23/14 0325 04/24/14 0405 04/25/14 0500  NA 139 142 144 145 147  K 3.0* 3.2* 3.2* 3.0* 3.4*  CL 93* 95* 94* 94* 96  CO2 33* 36* 39* 39* 39*  GLUCOSE 115* 131* 116* 120* 114*  BUN 29* 28* 28* 28* 30*  CREATININE 1.06 0.93 0.83 0.75 0.78  CALCIUM 8.2* 8.4 8.8 9.0 8.9   Liver Function Tests:  Recent Labs Lab 04/22/14 1057  ALBUMIN 2.5*   No results found for this basename: LIPASE, AMYLASE,  in the last 168 hours No results found for this basename: AMMONIA,  in the last 168 hours CBC:  Recent Labs Lab 04/21/14 0259 04/22/14 0244 04/24/14 1200  WBC 8.3 7.7 13.8*  HGB 9.9* 9.8* 10.9*  HCT 35.7* 35.2* 39.0  MCV 82.4 81.9 83.3  PLT 307 307 408*   Cardiac Enzymes: No results found for this basename: CKTOTAL, CKMB, CKMBINDEX, TROPONINI,  in the last 168 hours BNP: No components found with this basename: POCBNP,  CBG:  Recent Labs Lab 04/24/14 1307 04/24/14 1831 04/24/14 2146 04/25/14 0820 04/25/14 1159  GLUCAP 127* 117* 141* 113* 123*    No results found for this or any previous visit (from the past 240 hour(s)).   Scheduled Meds: . antiseptic oral rinse  15 mL Mouth Rinse q12n4p  . buPROPion  300 mg Oral Daily  .  chlorhexidine  15 mL Mouth Rinse BID  . docusate sodium  200 mg Oral BID  . feeding supplement (GLUCERNA SHAKE)  237 mL Oral TID BM  . insulin aspart  0-15 Units Subcutaneous TID WC  . insulin aspart  0-5 Units Subcutaneous QHS  . insulin glargine  20 Units Subcutaneous QHS  . metolazone  2.5 mg Oral BID  . potassium chloride  40 mEq Oral Once  . potassium chloride  40 mEq Oral BID  . simvastatin  20 mg Oral Daily  . sodium chloride  10-40 mL Intracatheter Q12H  . sodium chloride  3 mL Intravenous Q12H  . Warfarin - Pharmacist Dosing Inpatient   Does not apply q1800   Continuous Infusions: . furosemide (LASIX) infusion 12 mg/hr (04/25/14 0846)     Yadier Bramhall, DO  [facility] Pager [phone]  If 7PM-7AM, please contact night-coverage [email or web address] Password [email or web address] 04/25/2014, 2:18 PM   LOS: 10 days

## 2014-04-25 NOTE — Progress Notes (Signed)
SUBJECTIVE:  Breathing somewhat better.     PHYSICAL EXAM Filed Vitals:   04/25/14 0445 04/25/14 0504 04/25/14 0746 04/25/14 0816  BP:  129/50  138/60  Pulse: 86 75 91 83  Temp:  96.9 F (36.1 C)  97.8 F (36.6 C)  TempSrc:  Axillary  Axillary  Resp: 30 24 28  40  Height:  5\' 6"  (1.676 m)    Weight:  294 lb 12.1 oz (133.7 kg)    SpO2: 96% 96%  89%   General:  No distress Lungs:  Decreased breath sounds Heart:  Irregular Abdomen:  Positive bowel sounds, no rebound no guarding Extremities:  Diffuse edema slightly improved.   LABS:  Results for orders placed during the hospital encounter of 04/07/2014 (from the past 24 hour(s))  CBC     Status: Abnormal   Collection Time    04/24/14 12:00 PM      Result Value Ref Range   WBC 13.8 (*) 4.0 - 10.5 K/uL   RBC 4.68  3.87 - 5.11 MIL/uL   Hemoglobin 10.9 (*) 12.0 - 15.0 g/dL   HCT 16.139.0  09.636.0 - 04.546.0 %   MCV 83.3  78.0 - 100.0 fL   MCH 23.3 (*) 26.0 - 34.0 pg   MCHC 27.9 (*) 30.0 - 36.0 g/dL   RDW 40.921.4 (*) 81.111.5 - 91.415.5 %   Platelets 408 (*) 150 - 400 K/uL  GLUCOSE, CAPILLARY     Status: Abnormal   Collection Time    04/24/14  1:07 PM      Result Value Ref Range   Glucose-Capillary 127 (*) 70 - 99 mg/dL   Comment 1 Documented in Chart     Comment 2 Notify RN    GLUCOSE, CAPILLARY     Status: Abnormal   Collection Time    04/24/14  6:31 PM      Result Value Ref Range   Glucose-Capillary 117 (*) 70 - 99 mg/dL   Comment 1 Documented in Chart     Comment 2 Notify RN    GLUCOSE, CAPILLARY     Status: Abnormal   Collection Time    04/24/14  9:46 PM      Result Value Ref Range   Glucose-Capillary 141 (*) 70 - 99 mg/dL   Comment 1 Documented in Chart     Comment 2 Notify RN    PROTIME-INR     Status: Abnormal   Collection Time    04/25/14  5:00 AM      Result Value Ref Range   Prothrombin Time 49.2 (*) 11.6 - 15.2 seconds   INR 5.39 (*) 0.00 - 1.49  BASIC METABOLIC PANEL     Status: Abnormal   Collection Time   04/25/14  5:00 AM      Result Value Ref Range   Sodium 147  137 - 147 mEq/L   Potassium 3.4 (*) 3.7 - 5.3 mEq/L   Chloride 96  96 - 112 mEq/L   CO2 39 (*) 19 - 32 mEq/L   Glucose, Bld 114 (*) 70 - 99 mg/dL   BUN 30 (*) 6 - 23 mg/dL   Creatinine, Ser 7.820.78  0.50 - 1.10 mg/dL   Calcium 8.9  8.4 - 95.610.5 mg/dL   GFR calc non Af Amer 88 (*) >90 mL/min   GFR calc Af Amer >90  >90 mL/min   Anion gap 12  5 - 15  GLUCOSE, CAPILLARY     Status: Abnormal   Collection Time  04/25/14  8:20 AM      Result Value Ref Range   Glucose-Capillary 113 (*) 70 - 99 mg/dL   Comment 1 Documented in Chart     Comment 2 Notify RN      Intake/Output Summary (Last 24 hours) at 04/25/14 1000 Last data filed at 04/25/14 0800  Gross per 24 hour  Intake    180 ml  Output   1500 ml  Net  -1320 ml    ASSESSMENT AND PLAN:  ACUTE ON CHRONIC SYSTOLIC AND DIASTOLIC HF:   Reasonable UO.  Weights appear to be up.  However, I suspect this is an error.  Continue current therapy.  I will give a total of 100 meq potassium today.     ATRIAL FIB:  Continue current meds.   Robin [name] 04/25/2014 10:00 AM

## 2014-04-25 NOTE — Progress Notes (Signed)
ANTICOAGULATION CONSULT NOTE - Follow Up Consult  Pharmacy Consult:  Coumadin Indication: atrial fibrillation  No Known Allergies  Patient Measurements: Height: 5\' 6"  (167.6 cm) Weight: 294 lb 12.1 oz (133.7 kg) IBW/kg (Calculated) : 59.3  Vital Signs: Temp: 97.8 F (36.6 C) (07/23 0816) Temp src: Axillary (07/23 0816) BP: 138/60 mmHg (07/23 0816) Pulse Rate: 83 (07/23 0816)  Recent Labs  04/23/14 0325 04/24/14 0405 04/24/14 1200 04/25/14 0500  HGB  --   --  10.9*  --   HCT  --   --  39.0  --   PLT  --   --  408*  --   LABPROT 44.2* 44.8*  --  49.2*  INR 4.70* 4.78*  --  5.39*  CREATININE 0.83 0.75  --  0.78    Estimated Creatinine Clearance: 103.9 ml/min (by C-G formula based on Cr of 0.78).   Assessment: 63 yo female admitted with CHF exacerbation.  Pharmacy consulted to manage Coumadin for history of Afib.  INR continues to trend up, remains supratherapeutic at 5.39 today.  Last dose of warfarin was on 7/18.  Supratherapeutic INR likely multifactorial; however, fluconazole has now been discontinued which should help normalize INR.  PTA warfarin dose: 5 mg po daily  Goal of Therapy:  INR 2-3   Plan:  1. Hold coumadin tonight 2. Daily PT/INR  3. Monitor closely for signs and symptoms of bleeding    Agapito GamesAlison Fia Hebert, PharmD, BCPS Clinical Pharmacist Pager: [phone] 04/25/2014 10:30 AM

## 2014-04-25 NOTE — Progress Notes (Signed)
INITIAL NUTRITION ASSESSMENT  DOCUMENTATION CODES Per approved criteria  -Not Applicable   INTERVENTION: - Glucerna Shake po TID, each supplement provides 220 kcal and 10 grams of protein - RD will continue to monitor for nutrition care plan.  NUTRITION DIAGNOSIS: Inadequate oral intake related to respiratory failure and CHF as evidenced by reported intake less than estimated needs.   Goal: Pt to meet >/= 90% of their estimated nutrition needs   Monitor:  Weight trends, po intake, I/O's, labs  Reason for Assessment: Low Braden Score  63 y.o. female  Admitting Dx: Acute respiratory failure with hypoxia and hypercarbia  ASSESSMENT: [name] with acute on chronic respiratory failure, OSA/OHS, chronic diastolic heart failure, admitted 7/13 for SOB. Had persistent SOB despite BiPAP and diuresis, PCCM consulted.  - Pt unable to provide nutritional history. - Per RN and chart, pt has been eating ~50-65% of meals, but po intake is difficulty due to respiratory failure. Pt with stage II wound.  - Pt with no significant signs of fat and/or muscle wasting. - Pt's current body weight has increased 46 lbs in one month, most likely from fluid retention.  K 3.4  Height: Ht Readings from Last 1 Encounters:  04/25/14 5\' 6"  (1.676 m)    Weight: Wt Readings from Last 1 Encounters:  04/25/14 294 lb 12.1 oz (133.7 kg)    Ideal Body Weight: 59.3 kg  % Ideal Body Weight: 225%  Wt Readings from Last 10 Encounters:  04/25/14 294 lb 12.1 oz (133.7 kg)  03/27/14 248 lb (112.492 kg)  03/20/14 245 lb 13 oz (111.5 kg)  10/25/11 296 lb (134.265 kg)  10/18/11 296 lb (134.265 kg)  08/30/11 298 lb (135.172 kg)  08/10/11 305 lb (138.347 kg)  08/02/11 296 lb (134.265 kg)  07/12/11 298 lb 9.6 oz (135.444 kg)    Usual Body Weight: unknown  % Usual Body Weight: unknown  BMI:  Body mass index is 47.6 kg/(m^2).  Estimated Nutritional Needs: Kcal: 2100-2300 Protein: 120-140  g Fluid: 2.1-2.3 L  Skin: Stage II wound on sacrum  Diet Order:    EDUCATION NEEDS: -Education not appropriate at this time   Intake/Output Summary (Last 24 hours) at 04/25/14 1328 Last data filed at 04/25/14 1202  Gross per 24 hour  Intake    168 ml  Output   1575 ml  Net  -1407 ml    Last BM: none recorded   Labs:   Recent Labs Lab 04/23/14 0325 04/24/14 0405 04/25/14 0500  NA 144 145 147  K 3.2* 3.0* 3.4*  CL 94* 94* 96  CO2 39* 39* 39*  BUN 28* 28* 30*  CREATININE 0.83 0.75 0.78  CALCIUM 8.8 9.0 8.9  GLUCOSE 116* 120* 114*    CBG (last 3)   Recent Labs  04/24/14 2146 04/25/14 0820 04/25/14 1159  GLUCAP 141* 113* 123*    Scheduled Meds: . antiseptic oral rinse  15 mL Mouth Rinse q12n4p  . buPROPion  300 mg Oral Daily  . chlorhexidine  15 mL Mouth Rinse BID  . docusate sodium  200 mg Oral BID  . insulin aspart  0-15 Units Subcutaneous TID WC  . insulin aspart  0-5 Units Subcutaneous QHS  . insulin glargine  20 Units Subcutaneous QHS  . metolazone  2.5 mg Oral BID  . potassium chloride  40 mEq Oral Once  . potassium chloride  40 mEq Oral BID  . simvastatin  20 mg Oral Daily  . sodium chloride  10-40 mL Intracatheter Q12H  . sodium chloride  3 mL Intravenous Q12H  . Warfarin - Pharmacist Dosing Inpatient   Does not apply q1800    Continuous Infusions: . furosemide (LASIX) infusion 12 mg/hr (04/25/14 0846)    Past Medical History  Diagnosis Date  . Diabetes mellitus   . Hypertension   . Hyperlipidemia   . Atrial fibrillation   . Sleep apnea   . Cholelithiasis   . CAD (coronary artery disease)     atrial fibrillation  . Shortness of breath   . Chronic kidney disease 04/04/2014    ACTUE RENAL FAILURE    Past Surgical History  Procedure Laterality Date  . Tonsillectomy  1970s  . Hemorroidectomy  1990s  . Choledochal cyst excision  1980s  . Parathyroidectomy    . Endovenous ablation saphenous vein w/ laser  08-02-2011  Right greater  saphenous vein    Ebbie Latus RD, LDN

## 2014-04-25 NOTE — Progress Notes (Signed)
**Note De-Identified Banesa Tristan Obfuscation** Patient removed from BIPAP to NRB by RN.  BBS diminished, SAT 90%.  RT to continue to monitor.

## 2014-04-25 NOTE — Progress Notes (Signed)
Utilization review completed.  

## 2014-04-26 DIAGNOSIS — J189 Pneumonia, unspecified organism: Secondary | ICD-10-CM

## 2014-04-26 LAB — BASIC METABOLIC PANEL
Anion gap: 10 (ref 5–15)
BUN: 31 mg/dL — AB (ref 6–23)
CHLORIDE: 94 meq/L — AB (ref 96–112)
CO2: 42 mEq/L (ref 19–32)
Calcium: 8.8 mg/dL (ref 8.4–10.5)
Creatinine, Ser: 0.81 mg/dL (ref 0.50–1.10)
GFR calc Af Amer: 89 mL/min — ABNORMAL LOW (ref >90)
GFR, EST NON AFRICAN AMERICAN: 77 mL/min — AB (ref >90)
GLUCOSE: 117 mg/dL — AB (ref 70–99)
POTASSIUM: 3.6 meq/L — AB (ref 3.7–5.3)
Sodium: 146 mEq/L (ref 137–147)

## 2014-04-26 LAB — CBC
HCT: 35.8 % — ABNORMAL LOW (ref 36.0–46.0)
Hemoglobin: 9.9 g/dL — ABNORMAL LOW (ref 12.0–15.0)
MCH: 23.1 pg — ABNORMAL LOW (ref 26.0–34.0)
MCHC: 27.7 g/dL — ABNORMAL LOW (ref 30.0–36.0)
MCV: 83.4 fL (ref 78.0–100.0)
PLATELETS: 354 10*3/uL (ref 150–400)
RBC: 4.29 MIL/uL (ref 3.87–5.11)
RDW: 21.6 % — ABNORMAL HIGH (ref 11.5–15.5)
WBC: 13 10*3/uL — ABNORMAL HIGH (ref 4.0–10.5)

## 2014-04-26 LAB — GLUCOSE, CAPILLARY
GLUCOSE-CAPILLARY: 104 mg/dL — AB (ref 70–99)
GLUCOSE-CAPILLARY: 117 mg/dL — AB (ref 70–99)
Glucose-Capillary: 312 mg/dL — ABNORMAL HIGH (ref 70–99)
Glucose-Capillary: 129 mg/dL — ABNORMAL HIGH (ref 70–99)

## 2014-04-26 LAB — URINE CULTURE
COLONY COUNT: NO GROWTH
CULTURE: NO GROWTH

## 2014-04-26 LAB — PROTIME-INR
INR: 5.07 (ref 0.00–1.49)
Prothrombin Time: 46.9 seconds — ABNORMAL HIGH (ref 11.6–15.2)

## 2014-04-26 NOTE — Progress Notes (Signed)
Called to Pt's room to place BIPAP. Pt decided she wasn't ready to be placed on BIPAP, but did voice her concern again about going on BIPAP later in the evening. Explained to pt that she more than likely had a bad experience with BIPAP today because there wasn't a good seal with the mask, so the pressure felt like it was more than actually set. Pt asked if i could turn pressure down, IPAP turned down to 12cmH20. Advised pt to let us know if she was uncomfortable with the pressures set, and we would adjust accordingly. Also advised pt to please notify RT for any BIPAP needs. RT will continue to monitor.

## 2014-04-26 NOTE — Progress Notes (Signed)
Pt taken off BIPAP and placed on 100% NRB. Pt states she goes on and off BIPAP at her request. Pt to notify RT when ready to go back on BIPAP . RT will continue to monitor.

## 2014-04-26 NOTE — Progress Notes (Signed)
   Name: Robin BaizeJudith M Gelin MRN: [id number] DOB: 01/05/1951    ADMISSION DATE:  08/10/2014 CONSULTATION DATE:  04/24/2014  REFERRING MD :  Tat PRIMARY SERVICE:  TRH  CHIEF COMPLAINT:  SOB  BRIEF PATIENT DESCRIPTION: [name] with acute on chronic respiratory failure, OSA/OHS, chronic diastolic heart failure, admitted 7/13 for SOB.  Had persistent SOB despite BiPAP and diuresis, PCCM consulted.  SIGNIFICANT EVENTS / STUDIES:  Echo 7/13 >>> EF 55-60%, diastolic fxn unable to be evaluated due to afib.  LINES / TUBES: Right PICC 7/20 >>>  CULTURES: Blood 7/13 >>> neg Urine 7/13 >>> neg  ANTIBIOTICS: Ceftriaxone 7/20 >>> 7/22 Diflucan 7/14 >>> 7/21 Vanc 7/13 >>> 7/21 Cefepime 7/13 >>> 7/14   INTERVAL HISTORY: Rotated BiPAP and NRB throughout the night as instructed.  She reports she is breathing easier this AM.  Feels better with BiPAP on.  Have encouraged her to continue with BiPAP as able.  VITAL SIGNS: Temp:  [96.3 F (35.7 C)-98.3 F (36.8 C)] 97.4 F (36.3 C) (07/24 1152) Pulse Rate:  [79-92] 89 (07/24 1152) Resp:  [17-30] 24 (07/24 1152) BP: (78-130)/(40-66) 78/40 mmHg (07/24 1152) SpO2:  [88 %-96 %] 96 % (07/24 1152) FiO2 (%):  [60 %-100 %] 100 % (07/24 0743)  PHYSICAL EXAMINATION: General: Morbidly obese female, resting in bed on NRB, in NAD. Neuro: A&O x 3, non-focal.  HEENT: /AT. PERRL, sclerae anicteric. Cardiovascular: IRIR, no M/R/G appreciated with respect to body habitus. Lungs: Respirations shallow but unlabored. CTA bilaterally, No W/R/R.  Abdomen: BS x 4, soft, NT/ND.  Musculoskeletal: Unna boots in place bilateral LE's, 2+ edema. Skin: Intact, warm, no rashes.     Recent Labs Lab 04/24/14 0405 04/25/14 0500 04/26/14 0507  NA 145 147 146  K 3.0* 3.4* 3.6*  CL 94* 96 94*  CO2 39* 39* 42*  BUN 28* 30* 31*  CREATININE 0.75 0.78 0.81  GLUCOSE 120* 114* 117*    Recent Labs Lab 04/22/14 0244 04/24/14 1200 04/25/14 2103  04/26/14 0507  HGB 9.8* 10.9* 10.5* 9.9*  HCT 35.2* 39.0 38.8 35.8*  WBC 7.7 13.8*  --  13.0*  PLT 307 408*  --  354   Dg Chest Port 1 View  04/25/2014   CLINICAL DATA:  Airspace disease  EXAM: PORTABLE CHEST - 1 VIEW  COMPARISON:  Yesterday  FINDINGS: Stable right PICC. Stable diffuse airspace disease within the right lung and stable small right pleural effusion. Diminished opacity in the left lung compatible with a combination of worsening airspace disease and pleural fluid. No pneumothorax. Normal heart size.  IMPRESSION: Stable right pleural effusion and right airspace disease.  Improved left airspace disease and left pleural effusion.   Electronically Signed   By: Maryclare BeanArt  Hoss M.D.   On: 04/25/2014 08:05    ASSESSMENT / PLAN:  Acute hypercarbic and hypoxic respiratory failure OSA / OHS Pulmonary edema Pleural effusions Refractory acute on chronic diastolic heart failure RECS: - Alternate BiPAP with NRB x 4hr cycles.  Have encouraged her to try and progress to less time on NRB (ie 4hrs BiPAP with 2hrs NRB) - Continue lasix ggt. - Continue metolazone. - No thora.  PCCM will sign off, please call back if needed.  Alyson ReedyWesam G. Gina Costilla, M.D. [facility]. Pager: [phone]. After hours pager: [phone].

## 2014-04-26 NOTE — Progress Notes (Signed)
SUBJECTIVE:  Breathing somewhat better.  Up in chair this AM    PHYSICAL EXAM Filed Vitals:   04/26/14 0059 04/26/14 0329 04/26/14 0347 04/26/14 0743  BP: 112/57 124/66  130/55  Pulse: 79 92 89 91  Temp: 97.3 F (36.3 C) 96.8 F (36 C)  98.3 F (36.8 C)  TempSrc: Axillary Axillary  Axillary  Resp: 26 17 29 25   Height:  5\' 6"  (1.676 m)    Weight:      SpO2: 92% 94% 95% 91%   General:  No distress Lungs:  Decreased breath sounds Heart:  Irregular Abdomen:  Positive bowel sounds, no rebound no guarding Extremities:  Diffuse edema slightly improved again.   LABS:  Results for orders placed during the hospital encounter of 04/21/2014 (from the past 24 hour(s))  GLUCOSE, CAPILLARY     Status: Abnormal   Collection Time    04/25/14 11:59 AM      Result Value Ref Range   Glucose-Capillary 123 (*) 70 - 99 mg/dL   Comment 1 Notify RN     Comment 2 Documented in Chart    URINALYSIS, ROUTINE W REFLEX MICROSCOPIC     Status: Abnormal   Collection Time    04/25/14  5:08 PM      Result Value Ref Range   Color, Urine RED (*) YELLOW   APPearance TURBID (*) CLEAR   Specific Gravity, Urine 1.015  1.005 - 1.030   pH 5.0  5.0 - 8.0   Glucose, UA NEGATIVE  NEGATIVE mg/dL   Hgb urine dipstick LARGE (*) NEGATIVE   Bilirubin Urine MODERATE (*) NEGATIVE   Ketones, ur 15 (*) NEGATIVE mg/dL   Protein, ur 30 (*) NEGATIVE mg/dL   Urobilinogen, UA 0.2  0.0 - 1.0 mg/dL   Nitrite NEGATIVE  NEGATIVE   Leukocytes, UA MODERATE (*) NEGATIVE  URINE MICROSCOPIC-ADD ON     Status: Abnormal   Collection Time    04/25/14  5:08 PM      Result Value Ref Range   Squamous Epithelial / LPF FEW (*) RARE   WBC, UA 3-6  <3 WBC/hpf   RBC / HPF TOO NUMEROUS TO COUNT  <3 RBC/hpf   Bacteria, UA FEW (*) RARE   Casts GRANULAR CAST (*) NEGATIVE  GLUCOSE, CAPILLARY     Status: Abnormal   Collection Time    04/25/14  5:22 PM      Result Value Ref Range   Glucose-Capillary 116 (*) 70 - 99 mg/dL  HEMOGLOBIN  AND HEMATOCRIT, BLOOD     Status: Abnormal   Collection Time    04/25/14  9:03 PM      Result Value Ref Range   Hemoglobin 10.5 (*) 12.0 - 15.0 g/dL   HCT 40.938.8  81.136.0 - 91.446.0 %  GLUCOSE, CAPILLARY     Status: Abnormal   Collection Time    04/25/14  9:39 PM      Result Value Ref Range   Glucose-Capillary 143 (*) 70 - 99 mg/dL  PROTIME-INR     Status: Abnormal   Collection Time    04/26/14  5:07 AM      Result Value Ref Range   Prothrombin Time 46.9 (*) 11.6 - 15.2 seconds   INR 5.07 (*) 0.00 - 1.49  BASIC METABOLIC PANEL     Status: Abnormal   Collection Time    04/26/14  5:07 AM      Result Value Ref Range   Sodium 146  137 -  147 mEq/L   Potassium 3.6 (*) 3.7 - 5.3 mEq/L   Chloride 94 (*) 96 - 112 mEq/L   CO2 42 (*) 19 - 32 mEq/L   Glucose, Bld 117 (*) 70 - 99 mg/dL   BUN 31 (*) 6 - 23 mg/dL   Creatinine, Ser 1.61  0.50 - 1.10 mg/dL   Calcium 8.8  8.4 - 09.6 mg/dL   GFR calc non Af Amer 77 (*) >90 mL/min   GFR calc Af Amer 89 (*) >90 mL/min   Anion gap 10  5 - 15  CBC     Status: Abnormal   Collection Time    04/26/14  5:07 AM      Result Value Ref Range   WBC 13.0 (*) 4.0 - 10.5 K/uL   RBC 4.29  3.87 - 5.11 MIL/uL   Hemoglobin 9.9 (*) 12.0 - 15.0 g/dL   HCT 04.5 (*) 40.9 - 81.1 %   MCV 83.4  78.0 - 100.0 fL   MCH 23.1 (*) 26.0 - 34.0 pg   MCHC 27.7 (*) 30.0 - 36.0 g/dL   RDW 91.4 (*) 78.2 - 95.6 %   Platelets 354  150 - 400 K/uL  GLUCOSE, CAPILLARY     Status: Abnormal   Collection Time    04/26/14  7:43 AM      Result Value Ref Range   Glucose-Capillary 104 (*) 70 - 99 mg/dL   Comment 1 Documented in Chart     Comment 2 Notify RN      Intake/Output Summary (Last 24 hours) at 04/26/14 1025 Last data filed at 04/26/14 0700  Gross per 24 hour  Intake    720 ml  Output   2050 ml  Net  -1330 ml    ASSESSMENT AND PLAN:  ACUTE ON CHRONIC SYSTOLIC AND DIASTOLIC HF:   Reasonable UO again.  Weights appear to be up.  However, I suspect this is an error.  Continue  current therapy.  Daily BMETs ordered.     ATRIAL FIB:  Continue current meds.   Fayrene Fearing [facility] 04/26/2014 10:25 AM

## 2014-04-26 NOTE — Progress Notes (Signed)
Physical Therapy Treatment Patient Details Name: Robin BaizeJudith M Straub MRN: [id number] DOB: 03/11/1951 Today's Date: 04/26/2014    History of Present Illness Recent admit for bilateral LE cellulitis and AKI with generalized weakness. D/C'd to CIR and had been home ~9 days before returning to the ED for SOB. Pt admitted with fluid overload and is currently being treated for acute CHF.     PT Comments    Pt admitted with above. Pt currently with functional limitations due to balance and endurance deficits.  Pt will benefit from skilled PT to increase their independence and safety with mobility to allow discharge to the venue listed below.   Follow Up Recommendations  SNF;Supervision/Assistance - 24 hour     Equipment Recommendations  None recommended by PT    Recommendations for Other Services       Precautions / Restrictions Precautions Precautions: Fall Precaution Comments: Bilateral Una boots Restrictions Weight Bearing Restrictions: No    Mobility  Bed Mobility Overal bed mobility: +2 for physical assistance;Needs Assistance Bed Mobility: Rolling;Sidelying to Sit Rolling: Max assist;+2 for physical assistance Sidelying to sit: Max assist;+2 for physical assistance       General bed mobility comments: Pt on Bipap mask on arrival.  Pt anxious and upset about mask.  Placed on nonrebreather mask and sats between 87-91% at rest.  RT in room as PT was mobilizing pt to see how she was doing.  Pt needed assist to get to EOB.  Scooting to EOB with max assist.      Transfers Overall transfer level: Needs assistance Equipment used: Rolling walker (2 wheeled) Transfers: Sit to/from [employer] CorporationStand;Stand Pivot Transfers Sit to Stand: Max assist;+2 physical assistance;From elevated surface Stand pivot transfers: Mod assist;+2 physical assistance;From elevated surface       General transfer comment: Nurse asked this PT to get pt on scales.  Pt able to stand pulling up on scale and PT go pt  weight.  Pt sat on bed and then RW placed in front of pt and pt then stood and pivoted.  Needed a lot of cues for postural stability and to stand tall as well as incr asssit for power up.  Once up, pt needed assist to weight shift bil directions to take pivotal steps to recliner.    Ambulation/Gait                 Stairs            Wheelchair Mobility    Modified Rankin (Stroke Patients Only)       Balance Overall balance assessment: Needs assistance;History of Falls Sitting-balance support: Bilateral upper extremity supported;Feet supported Sitting balance-Leahy Scale: Poor Sitting balance - Comments: Sat EOB 5 min with mod to min assist with pt with posterior lean.  Postural control: Posterior lean Standing balance support: Bilateral upper extremity supported;During functional activity Standing balance-Leahy Scale: Poor Standing balance comment: Needs mod asssist to stand statically with bil UE support.  Cannot stand fully upright.                      Cognition Arousal/Alertness: Awake/alert Behavior During Therapy: WFL for tasks assessed/performed Overall Cognitive Status: Within Functional Limits for tasks assessed                      Exercises General Exercises - Lower Extremity Ankle Circles/Pumps: AROM;Both;10 reps;Seated Quad Sets: AROM;Both;10 reps;Seated Long Arc Quad: AROM;Both;5 reps;Seated    General Comments  Pertinent Vitals/Pain Pt with sats 88-92% on arrival on Bipap.  Pt was uncomfortable on Bipap therefore, RT came in and placed pt on nonrebreather.  Sats 84-91% on nonrebreather with gettting to EOB and transfer to chair.  Pt's sats incr to 88-92% once resting on chair on nonrebreather mask therefore left in chair with nursing to monitor how pt does with nonrebreather mask.      Home Living                      Prior Function            PT Goals (current goals can now be found in the care plan section)  Progress towards PT goals: Progressing toward goals    Frequency  Min 3X/week    PT Plan Current plan remains appropriate    Co-evaluation             End of Session Equipment Utilized During Treatment: Gait belt;Oxygen Activity Tolerance: Patient limited by fatigue Patient left: in chair;with call bell/phone within reach;with nursing/sitter in room     Time: 0981-1914 PT Time Calculation (min): 39 min  Charges:  $Therapeutic Exercise: 8-22 mins $Therapeutic Activity: 23-37 mins                    G Codes:      INGOLD,Maxum Cassarino 05-10-14, 11:54 AM [facility] [phone] (pager)

## 2014-04-26 NOTE — Progress Notes (Signed)
Robin [name] TEAM 1 - Stepdown/ICU TEAM Progress Note  [name] [id number] DOB: 02/27/1951 DOA: 04/07/2014 PCP: Cala BradfordWHITE,Robin S, MD  Admit HPI / Brief Narrative: [name] with acute on chronic respiratory failure, OSA/OHS, chronic diastolic heart failure, admitted 7/13 for SOB. Had persistent SOB despite BiPAP and diuresis.  HPI/Subjective: Pt is currently on NRB, and appears comfortable.  She states her breathing is "better than yesterday."  She denies CP, n/v, or abdom pain.    Assessment/Plan:  Acute on chronic respiratory failure with hypoxemia and hypercarbia  Per PCCM to alternate BiPAP with NRB x 4hr cycles (encouraged her to try and progress to less time on NRB) - cont to diurese   Acute exacerbation on chronic diastolic CHF  Cardiology following - attempts to diurese continue   Chronic Atrial fibrillation  On chronic warfarin - rate controlled   Diabetes mellitus type 2 CBG reasonably controlled on average - follow   HTN BP well controlled  Pyuria Has completed a full course of abx tx  Acute on chronic renal failure (CKD stage III)  crt is now stable   Venous stasis dermatitis  Continue Unna boots   Hypokalemia  Replacing - improving - follow  Morbid obesity - Body mass index is 47.6 kg/(m^2).  Code Status: FULL Family Communication: no family present at time of exam Disposition Plan: SDU due to ongoing need for BIPAP  Consultants: Cardiology PCCM  Procedures: Echo 7/13 >>> EF 55-60%, diastolic fxn unable to be evaluated due to afib PICC 04/22/14  Antibiotics: Ceftriaxone 7/20 >>> 7/22  Diflucan 7/14 >>> 7/21  Vanc 7/13 >>> 7/21  Cefepime 7/13 >>> 7/14  DVT prophylaxis: warfarin  Objective: Blood pressure 106/61, pulse 90, temperature 98 F (36.7 C), temperature source Axillary, resp. rate 29, height 5\' 6"  (1.676 m), weight 133.7 kg (294 lb 12.1 oz), SpO2 97.00%.  Intake/Output Summary (Last 24 hours) at 04/26/14  1554 Last data filed at 04/26/14 1529  Gross per 24 hour  Intake   1224 ml  Output   1725 ml  Net   -501 ml   Exam: General: No acute respiratory distress - alert and conversant  Lungs: distant bs th/o all fields - no wheeze - fine crackles th/o  Cardiovascular: distant HS - irreg - rate controlled - no appreciable M or gallup  Abdomen: morbidly obese, nontender, soft, bowel sounds positive, no rebound Extremities: No significant cyanosis, clubbing bilateral lower extremities  Data Reviewed: Basic Metabolic Panel:  Recent Labs Lab 04/22/14 0244 04/23/14 0325 04/24/14 0405 04/25/14 0500 04/26/14 0507  NA 142 144 145 147 146  K 3.2* 3.2* 3.0* 3.4* 3.6*  CL 95* 94* 94* 96 94*  CO2 36* 39* 39* 39* 42*  GLUCOSE 131* 116* 120* 114* 117*  BUN 28* 28* 28* 30* 31*  CREATININE 0.93 0.83 0.75 0.78 0.81  CALCIUM 8.4 8.8 9.0 8.9 8.8    Liver Function Tests:  Recent Labs Lab 04/22/14 1057  ALBUMIN 2.5*   No results found for this basename: LIPASE, AMYLASE,  in the last 168 hours No results found for this basename: AMMONIA,  in the last 168 hours  Coags:  Recent Labs Lab 04/22/14 0244 04/23/14 0325 04/24/14 0405 04/25/14 0500 04/26/14 0507  INR 4.39* 4.70* 4.78* 5.39* 5.07*   No results found for this basename: PTT,  in the last 168 hours  CBC:  Recent Labs Lab 04/21/14 0259 04/22/14 0244 04/24/14 1200 04/25/14 2103 04/26/14 0507  WBC 8.3 7.7 13.8*  --  13.0*  HGB 9.9* 9.8* 10.9* 10.5* 9.9*  HCT 35.7* 35.2* 39.0 38.8 35.8*  MCV 82.4 81.9 83.3  --  83.4  PLT 307 307 408*  --  354    CBG:  Recent Labs Lab 04/25/14 1159 04/25/14 1722 04/25/14 2139 04/26/14 0743 04/26/14 1148  GLUCAP 123* 116* 143* 104* 312*    Recent Results (from the past 240 hour(s))  URINE CULTURE     Status: None   Collection Time    04/25/14  5:08 PM      Result Value Ref Range Status   Specimen Description URINE, CATHETERIZED   Final   Special Requests NONE   Final    Culture  Setup Time     Final   Value: 04/25/2014 19:12     Performed at [facility] Count     Final   Value: NO GROWTH     Performed at [facility]   Culture     Final   Value: NO GROWTH     Performed at [facility]   Report Status 04/26/2014 FINAL   Final     Studies:  Recent x-ray studies have been reviewed in detail by the Attending Physician  Scheduled Meds:  Scheduled Meds: . antiseptic oral rinse  15 mL Mouth Rinse q12n4p  . buPROPion  300 mg Oral Daily  . chlorhexidine  15 mL Mouth Rinse BID  . docusate sodium  200 mg Oral BID  . feeding supplement (GLUCERNA SHAKE)  237 mL Oral TID BM  . insulin aspart  0-15 Units Subcutaneous TID WC  . insulin aspart  0-5 Units Subcutaneous QHS  . insulin glargine  20 Units Subcutaneous QHS  . metolazone  2.5 mg Oral BID  . potassium chloride  40 mEq Oral Once  . potassium chloride  40 mEq Oral BID  . simvastatin  20 mg Oral Daily  . sodium chloride  10-40 mL Intracatheter Q12H  . Warfarin - Pharmacist Dosing Inpatient   Does not apply q1800    Time spent on care of this patient: 35 mins   [facility] T , MD   [facility] Office  [phone] Pager - Text Page per Amion as per below:  On-Call/Text Page:      [email or web address]      password [email or web address]  If 7PM-7AM, please contact night-coverage [email or web address] Password [facility] 04/26/2014, 3:54 PM   LOS: 11 days

## 2014-04-26 NOTE — Progress Notes (Signed)
ANTICOAGULATION CONSULT NOTE - Follow Up Consult  Pharmacy Consult:  Coumadin Indication: atrial fibrillation  No Known Allergies  Patient Measurements: Height: 5\' 6"  (167.6 cm) Weight:  (bed scale broken) IBW/kg (Calculated) : 59.3  Vital Signs: Temp: 98.3 F (36.8 C) (07/24 0743) Temp src: Axillary (07/24 0743) BP: 130/55 mmHg (07/24 0743) Pulse Rate: 91 (07/24 0743)  Recent Labs  04/24/14 0405  04/24/14 1200 04/25/14 0500 04/25/14 2103 04/26/14 0507  HGB  --   < > 10.9*  --  10.5* 9.9*  HCT  --   --  39.0  --  38.8 35.8*  PLT  --   --  408*  --   --  354  LABPROT 44.8*  --   --  49.2*  --  46.9*  INR 4.78*  --   --  5.39*  --  5.07*  CREATININE 0.75  --   --  0.78  --  0.81  < > = values in this interval not displayed.  Estimated Creatinine Clearance: 102.6 ml/min (by C-G formula based on Cr of 0.81).   Assessment: Robin [name] on coumadin pta for afib, admitted with CHF exacerbation. INR remains elevated but is starting to trend down 5.3-->5. Last dose of coumadin was 7/18. Supratherapeutic INR likely due to hepatic congestion and fluconazole, however, fluconazole has now been discontinued which should help normalize INR.  PTA dose: 5 mg po daily  Goal of Therapy:  INR 2-3  Plan:  1) No coumadin tonight 2) INR in AM  Louie CasaJennifer Lula Michaux, PharmD, BCPS 04/26/2014 10:17 AM

## 2014-04-27 ENCOUNTER — Inpatient Hospital Stay (HOSPITAL_COMMUNITY): Payer: BC Managed Care – PPO

## 2014-04-27 LAB — PROTIME-INR
INR: 3.81 — AB (ref 0.00–1.49)
Prothrombin Time: 37.5 seconds — ABNORMAL HIGH (ref 11.6–15.2)

## 2014-04-27 LAB — BASIC METABOLIC PANEL
ANION GAP: 12 (ref 5–15)
BUN: 33 mg/dL — ABNORMAL HIGH (ref 6–23)
CHLORIDE: 91 meq/L — AB (ref 96–112)
CO2: 41 mEq/L (ref 19–32)
Calcium: 8.6 mg/dL (ref 8.4–10.5)
Creatinine, Ser: 0.79 mg/dL (ref 0.50–1.10)
GFR calc Af Amer: >90 mL/min (ref >90)
GFR calc non Af Amer: 88 mL/min — ABNORMAL LOW (ref >90)
GLUCOSE: 127 mg/dL — AB (ref 70–99)
POTASSIUM: 3.9 meq/L (ref 3.7–5.3)
SODIUM: 144 meq/L (ref 137–147)

## 2014-04-27 LAB — GLUCOSE, CAPILLARY
GLUCOSE-CAPILLARY: 126 mg/dL — AB (ref 70–99)
GLUCOSE-CAPILLARY: 121 mg/dL — AB (ref 70–99)
GLUCOSE-CAPILLARY: 100 mg/dL — AB (ref 70–99)
Glucose-Capillary: 108 mg/dL — ABNORMAL HIGH (ref 70–99)

## 2014-04-27 LAB — CBC
HCT: 37.1 % (ref 36.0–46.0)
Hemoglobin: 9.9 g/dL — ABNORMAL LOW (ref 12.0–15.0)
MCH: 22.2 pg — ABNORMAL LOW (ref 26.0–34.0)
MCHC: 26.7 g/dL — AB (ref 30.0–36.0)
MCV: 83.4 fL (ref 78.0–100.0)
PLATELETS: 369 10*3/uL (ref 150–400)
RBC: 4.45 MIL/uL (ref 3.87–5.11)
RDW: 21.3 % — AB (ref 11.5–15.5)
WBC: 15.3 10*3/uL — ABNORMAL HIGH (ref 4.0–10.5)

## 2014-04-27 MED ORDER — WARFARIN SODIUM 1 MG PO TABS
1.0000 mg | ORAL_TABLET | Freq: Once | ORAL | Status: AC
  Administered 2014-04-27: 1 mg via ORAL
  Filled 2014-04-27: qty 1

## 2014-04-27 NOTE — Progress Notes (Signed)
On arrival to room, pt on Bipap, pt states she has been on it X2hrs and would like to come off, Bipap taken off at this time and placed on NRB, pt toleratin well, no distress noted, RT will monitor

## 2014-04-27 NOTE — Progress Notes (Signed)
ANTICOAGULATION CONSULT NOTE - Follow Up Consult  Pharmacy Consult for coumadin Indication: atrial fibrillation  No Known Allergies  Patient Measurements: Height: 5\' 6"  (167.6 cm) Weight:  (bed scale broken) IBW/kg (Calculated) : 59.3 Heparin Dosing Weight:   Vital Signs: Temp: 97.6 F (36.4 C) (07/25 0401) Temp src: Oral (07/25 0401) BP: 130/59 mmHg (07/25 0401) Pulse Rate: 87 (07/25 0401)  Labs:  Recent Labs  04/24/14 1200 04/25/14 0500 04/25/14 2103 04/26/14 0507 04/27/14 0355  HGB 10.9*  --  10.5* 9.9* 9.9*  HCT 39.0  --  38.8 35.8* 37.1  PLT 408*  --   --  354 369  LABPROT  --  49.2*  --  46.9* 37.5*  INR  --  5.39*  --  5.07* 3.81*  CREATININE  --  0.78  --  0.81 0.79    Estimated Creatinine Clearance: 103.9 ml/min (by C-G formula based on Cr of 0.79).   Medications:  Scheduled:  . antiseptic oral rinse  15 mL Mouth Rinse q12n4p  . buPROPion  300 mg Oral Daily  . chlorhexidine  15 mL Mouth Rinse BID  . docusate sodium  200 mg Oral BID  . feeding supplement (GLUCERNA SHAKE)  237 mL Oral TID BM  . insulin aspart  0-15 Units Subcutaneous TID WC  . insulin aspart  0-5 Units Subcutaneous QHS  . insulin glargine  20 Units Subcutaneous QHS  . metolazone  2.5 mg Oral BID  . potassium chloride  40 mEq Oral Once  . potassium chloride  40 mEq Oral BID  . simvastatin  20 mg Oral Daily  . Warfarin - Pharmacist Dosing Inpatient   Does not apply q1800   Infusions:  . furosemide (LASIX) infusion 12 mg/hr (04/27/14 0300)    Assessment: 63 yo female with afib is currently on supratherapeutic coumadin.  INR is coming down to 3.81 from 5.07.  Patient was on coumadin 5mg  po daily prior to admission. Goal of Therapy:  INR 2-3 Monitor platelets by anticoagulation protocol: Yes   Plan:  1) Coumadin 1 mg po x1 2) INR in am  Harshith Pursell, Tsz-Yin 04/27/2014,7:01 AM

## 2014-04-27 NOTE — Progress Notes (Signed)
Pt remains on NRB mask, spo2 92-93%, pt tolerating well at this time, no bipap at this time per pt request, rt will monitor

## 2014-04-27 NOTE — Progress Notes (Signed)
[place] TEAM 1 - Stepdown/ICU TEAM Progress Note  Jewel BaizeJudith M Weimar JXB:[id number] DOB: 01/03/1951 DOA: Jul 07, 2014 PCP: Cala BradfordWHITE,[name], MD  Admit HPI / Brief Narrative: [name] with acute on chronic respiratory failure, OSA/OHS, chronic diastolic heart failure, admitted 7/13 for SOB. Had persistent SOB despite BiPAP and diuresis.  HPI/Subjective: Pt is w/o new complaints.  Currently on BIPAP, at her request, and tolerating well.     Assessment/Plan:  Acute on chronic respiratory failure with hypoxemia and hypercarbia  alternate BiPAP with NRB x 4hr cycles (encouraged her to try and progress to less time on NRB) - cont to diurese   Acute exacerbation on chronic diastolic CHF  Cardiology following - attempts to diurese continue, w/ pt currently negative ~11L - wgt not measured today   Chronic Atrial fibrillation  On chronic warfarin - rate controlled   Diabetes mellitus type 2 CBG reasonably controlled on average - follow w/o change today   HTN BP well controlled  Pyuria Has completed a full course of abx tx  Acute on chronic renal failure (CKD stage III)  crt is now stable   Venous stasis dermatitis  continue Unna boots   Hypokalemia  Replaced to goal - follow trend  Morbid obesity - Body mass index is 47.6 kg/(m^2).  Code Status: FULL Family Communication: no family present at time of exam Disposition Plan: SDU due to ongoing need for BIPAP  Consultants: Cardiology PCCM  Procedures: Echo 7/13 >>> EF 55-60%, diastolic fxn unable to be evaluated due to afib PICC 04/22/14  Antibiotics: Ceftriaxone 7/20 >>> 7/22  Diflucan 7/14 >>> 7/21  Vanc 7/13 >>> 7/21  Cefepime 7/13 >>> 7/14  DVT prophylaxis: warfarin  Objective: Blood pressure 126/62, pulse 89, temperature 98.4 F (36.9 C), temperature source Axillary, resp. rate 24, height 5\' 6"  (1.676 m), weight 133.7 kg (294 lb 12.1 oz), SpO2 95.00%.  Intake/Output Summary (Last 24 hours) at  04/27/14 1438 Last data filed at 04/27/14 1228  Gross per 24 hour  Intake    180 ml  Output   1200 ml  Net  -1020 ml   Exam: General: No acute respiratory distress - tolerating BIPAP well Lungs: distant bs th/o all fields - no wheeze - fine crackles th/o  Cardiovascular: distant HS - irreg - rate controlled - no appreciable M or gallup  Abdomen: morbidly obese, nontender, soft, bowel sounds positive, no rebound Extremities: no significant cyanosis, clubbing bilateral lower extremities  Data Reviewed: Basic Metabolic Panel:  Recent Labs Lab 04/23/14 0325 04/24/14 0405 04/25/14 0500 04/26/14 0507 04/27/14 0355  NA 144 145 147 146 144  K 3.2* 3.0* 3.4* 3.6* 3.9  CL 94* 94* 96 94* 91*  CO2 39* 39* 39* 42* 41*  GLUCOSE 116* 120* 114* 117* 127*  BUN 28* 28* 30* 31* 33*  CREATININE 0.83 0.75 0.78 0.81 0.79  CALCIUM 8.8 9.0 8.9 8.8 8.6    Liver Function Tests:  Recent Labs Lab 04/22/14 1057  ALBUMIN 2.5*   No results found for this basename: LIPASE, AMYLASE,  in the last 168 hours No results found for this basename: AMMONIA,  in the last 168 hours  Coags:  Recent Labs Lab 04/23/14 0325 04/24/14 0405 04/25/14 0500 04/26/14 0507 04/27/14 0355  INR 4.70* 4.78* 5.39* 5.07* 3.81*   No results found for this basename: PTT,  in the last 168 hours  CBC:  Recent Labs Lab 04/21/14 0259 04/22/14 0244 04/24/14 1200 04/25/14 2103 04/26/14 0507 04/27/14 0355  WBC  8.3 7.7 13.8*  --  13.0* 15.3*  HGB 9.9* 9.8* 10.9* 10.5* 9.9* 9.9*  HCT 35.7* 35.2* 39.0 38.8 35.8* 37.1  MCV 82.4 81.9 83.3  --  83.4 83.4  PLT 307 307 408*  --  354 369    CBG:  Recent Labs Lab 04/26/14 1148 04/26/14 1703 04/26/14 2152 04/27/14 0728 04/27/14 1218  GLUCAP 312* 129* 117* 108* 126*    Recent Results (from the past 240 hour(s))  URINE CULTURE     Status: None   Collection Time    04/25/14  5:08 PM      Result Value Ref Range Status   Specimen Description URINE, CATHETERIZED    Final   Special Requests NONE   Final   Culture  Setup Time     Final   Value: 04/25/2014 19:12     Performed at [facility] Count     Final   Value: NO GROWTH     Performed at [facility]   Culture     Final   Value: NO GROWTH     Performed at [facility]   Report Status 04/26/2014 FINAL   Final     Studies:  Recent x-ray studies have been reviewed in detail by the Attending Physician  Scheduled Meds:  Scheduled Meds: . antiseptic oral rinse  15 mL Mouth Rinse q12n4p  . buPROPion  300 mg Oral Daily  . chlorhexidine  15 mL Mouth Rinse BID  . docusate sodium  200 mg Oral BID  . feeding supplement (GLUCERNA SHAKE)  237 mL Oral TID BM  . insulin aspart  0-15 Units Subcutaneous TID WC  . insulin aspart  0-5 Units Subcutaneous QHS  . insulin glargine  20 Units Subcutaneous QHS  . metolazone  2.5 mg Oral BID  . potassium chloride  40 mEq Oral Once  . potassium chloride  40 mEq Oral BID  . simvastatin  20 mg Oral Daily  . warfarin  1 mg Oral ONCE-1800  . Warfarin - Pharmacist Dosing Inpatient   Does not apply q1800    Time spent on care of this patient: 35 mins   [facility] T , MD   [facility] Office  [phone] Pager - Text Page per Amion as per below:  On-Call/Text Page:      [email or web address]      password [email or web address]  If 7PM-7AM, please contact night-coverage [email or web address] Password [facility] 04/27/2014, 2:38 PM   LOS: 12 days

## 2014-04-27 NOTE — Progress Notes (Signed)
Pt 02 saturation dropped after refusing to stay on bipap longer than hour and half.  O2 sats in mid 80's on NRB-no reservoir, pt unable to rebound with oxygenation.  Placed patient back on bipap. elink camerad in and then called to speak with RN.  Notified Dr. Sharon SellerMcClung of findings and new orders placed. Will continue to monitor.  Pt now NPO status until further evaluation of respiratory status.

## 2014-04-27 NOTE — Progress Notes (Signed)
Pt remains off bipap on NRB tolerating well, awake and alert, no distress at this time, rt will monitor

## 2014-04-27 NOTE — Progress Notes (Signed)
SUBJECTIVE:  Breathing better.  Did not get up to chair today.   PHYSICAL EXAM Filed Vitals:   04/27/14 0825 04/27/14 1224 04/27/14 1535 04/27/14 1606  BP:      Pulse: 77 89  89  Temp:  98.4 F (36.9 C) 97.8 F (36.6 C)   TempSrc:  Axillary Axillary   Resp: 20 24  19   Height:      Weight:      SpO2: 93% 95%  92%   General:  No distress Lungs:  Decreased breath sounds Heart:  Irregular Abdomen:  Positive bowel sounds, no rebound no guarding Extremities:  Diffuse edema slightly improved again. Legs wrapped.   LABS:  Results for orders placed during the hospital encounter of 05/01/2014 (from the past 24 hour(s))  GLUCOSE, CAPILLARY     Status: Abnormal   Collection Time    04/26/14  5:03 PM      Result Value Ref Range   Glucose-Capillary 129 (*) 70 - 99 mg/dL  GLUCOSE, CAPILLARY     Status: Abnormal   Collection Time    04/26/14  9:52 PM      Result Value Ref Range   Glucose-Capillary 117 (*) 70 - 99 mg/dL   Comment 1 Documented in Chart     Comment 2 Notify RN    PROTIME-INR     Status: Abnormal   Collection Time    04/27/14  3:55 AM      Result Value Ref Range   Prothrombin Time 37.5 (*) 11.6 - 15.2 seconds   INR 3.81 (*) 0.00 - 1.49  BASIC METABOLIC PANEL     Status: Abnormal   Collection Time    04/27/14  3:55 AM      Result Value Ref Range   Sodium 144  137 - 147 mEq/L   Potassium 3.9  3.7 - 5.3 mEq/L   Chloride 91 (*) 96 - 112 mEq/L   CO2 41 (*) 19 - 32 mEq/L   Glucose, Bld 127 (*) 70 - 99 mg/dL   BUN 33 (*) 6 - 23 mg/dL   Creatinine, Ser 6.04  0.50 - 1.10 mg/dL   Calcium 8.6  8.4 - 54.0 mg/dL   GFR calc non Af Amer 88 (*) >90 mL/min   GFR calc Af Amer >90  >90 mL/min   Anion gap 12  5 - 15  CBC     Status: Abnormal   Collection Time    04/27/14  3:55 AM      Result Value Ref Range   WBC 15.3 (*) 4.0 - 10.5 K/uL   RBC 4.45  3.87 - 5.11 MIL/uL   Hemoglobin 9.9 (*) 12.0 - 15.0 g/dL   HCT 98.1  19.1 - 47.8 %   MCV 83.4  78.0 - 100.0 fL   MCH  22.2 (*) 26.0 - 34.0 pg   MCHC 26.7 (*) 30.0 - 36.0 g/dL   RDW 29.5 (*) 62.1 - 30.8 %   Platelets 369  150 - 400 K/uL  GLUCOSE, CAPILLARY     Status: Abnormal   Collection Time    04/27/14  7:28 AM      Result Value Ref Range   Glucose-Capillary 108 (*) 70 - 99 mg/dL  GLUCOSE, CAPILLARY     Status: Abnormal   Collection Time    04/27/14 12:18 PM      Result Value Ref Range   Glucose-Capillary 126 (*) 70 - 99 mg/dL   Comment 1 Documented in Chart  Comment 2 Notify RN      Intake/Output Summary (Last 24 hours) at 04/27/14 1650 Last data filed at 04/27/14 1535  Gross per 24 hour  Intake    168 ml  Output   1125 ml  Net   -957 ml    ASSESSMENT AND PLAN:  ACUTE ON CHRONIC SYSTOLIC AND DIASTOLIC HF:   Reasonable UO. No weights recorded last 2 days.Not sure how accurate bed weights are. Continue current therapy.  Daily BMETs ordered.     ATRIAL FIB:  Continue current meds.   Theron Aristaeter [facility] LLCJordanMD,FACC 04/27/2014 4:50 PM

## 2014-04-28 LAB — BASIC METABOLIC PANEL
Anion gap: 10 (ref 5–15)
BUN: 32 mg/dL — ABNORMAL HIGH (ref 6–23)
CALCIUM: 8.6 mg/dL (ref 8.4–10.5)
CHLORIDE: 93 meq/L — AB (ref 96–112)
CO2: 43 mEq/L (ref 19–32)
Creatinine, Ser: 0.76 mg/dL (ref 0.50–1.10)
GFR calc Af Amer: >90 mL/min (ref >90)
GFR calc non Af Amer: 89 mL/min — ABNORMAL LOW (ref >90)
GLUCOSE: 116 mg/dL — AB (ref 70–99)
Potassium: 3.4 mEq/L — ABNORMAL LOW (ref 3.7–5.3)
SODIUM: 146 meq/L (ref 137–147)

## 2014-04-28 LAB — GLUCOSE, CAPILLARY
GLUCOSE-CAPILLARY: 118 mg/dL — AB (ref 70–99)
Glucose-Capillary: 127 mg/dL — ABNORMAL HIGH (ref 70–99)
Glucose-Capillary: 107 mg/dL — ABNORMAL HIGH (ref 70–99)

## 2014-04-28 LAB — PROTIME-INR
INR: 3.83 — AB (ref 0.00–1.49)
Prothrombin Time: 37.7 seconds — ABNORMAL HIGH (ref 11.6–15.2)

## 2014-04-28 MED ORDER — POTASSIUM CHLORIDE CRYS ER 20 MEQ PO TBCR
40.0000 meq | EXTENDED_RELEASE_TABLET | Freq: Three times a day (TID) | ORAL | Status: DC
  Administered 2014-04-28 – 2014-04-30 (×2): 40 meq via ORAL
  Filled 2014-04-28 (×10): qty 2

## 2014-04-28 MED ORDER — WARFARIN 0.5 MG HALF TABLET
0.5000 mg | ORAL_TABLET | Freq: Once | ORAL | Status: AC
  Administered 2014-04-28: 0.5 mg via ORAL
  Filled 2014-04-28: qty 1

## 2014-04-28 NOTE — Progress Notes (Signed)
[place] TEAM 1 - Stepdown/ICU TEAM Progress Note  Robin [name] ZOX:[id number] DOB: Feb 03, 1951 DOA: Apr 16, 2014 PCP: [name], MD  Admit HPI / Brief Narrative: [name] with acute on chronic respiratory failure, OSA/OHS, chronic diastolic heart failure, admitted 7/13 for SOB. Had persistent SOB despite BiPAP and diuresis.  HPI/Subjective: Very slow to improve.  Has remained on BIPAP for majority of day as she desaturates into the 80s on NRB.  Is awake and alert.  Does not like BIPAP, and is hungry.  Denies pain.     Assessment/Plan:  Acute on chronic respiratory failure with hypoxemia and hypercarbia  Attempting to wean from BIPAP but not making much headway today - attempt to get pt up to chair   Acute exacerbation on chronic diastolic CHF  Cardiology following - attempts to diurese continue, w/ pt currently negative ~13L - lasix gtt was increased yesterday   Chronic Atrial fibrillation  On chronic warfarin - rate controlled   Diabetes mellitus type 2 CBG reasonably controlled on average - follow w/o change today   HTN BP well controlled  Pyuria Has completed a full course of abx tx  Acute on chronic renal failure (CKD stage III)  crt is now stable   Venous stasis dermatitis  continue Unna boots   Hypokalemia  Cont to replace and follow   Morbid obesity - Body mass index is 45.64 kg/(m^2).  Code Status: FULL Family Communication: no family present at time of exam Disposition Plan: SDU due to ongoing need for BIPAP  Consultants: Cardiology PCCM  Procedures: Echo 7/13 >>> EF 55-60%, diastolic fxn unable to be evaluated due to afib PICC 04/22/14  Antibiotics: Ceftriaxone 7/20 >>> 7/22  Diflucan 7/14 >>> 7/21  Vanc 7/13 >>> 7/21  Cefepime 7/13 >>> 7/14  DVT prophylaxis: warfarin  Objective: Blood pressure 133/60, pulse 81, temperature 97.7 F (36.5 C), temperature source Axillary, resp. rate 23, height 5\' 6"  (1.676 m), weight  128.2 kg (282 lb 10.1 oz), SpO2 96.00%.  Intake/Output Summary (Last 24 hours) at 04/28/14 1456 Last data filed at 04/28/14 1239  Gross per 24 hour  Intake 431.33 ml  Output   1950 ml  Net -1518.67 ml   Exam: General: No acute respiratory distress - tolerating BIPAP Lungs: distant bs th/o all fields - no wheeze - fine crackles th/o  Cardiovascular: distant HS - irreg - rate controlled - no appreciable M or gallup  Abdomen: morbidly obese, nontender, soft, bowel sounds positive, no rebound Extremities: no significant cyanosis, clubbing bilateral lower extremities  Data Reviewed: Basic Metabolic Panel:  Recent Labs Lab 04/24/14 0405 04/25/14 0500 04/26/14 0507 04/27/14 0355 04/28/14 0413  NA 145 147 146 144 146  K 3.0* 3.4* 3.6* 3.9 3.4*  CL 94* 96 94* 91* 93*  CO2 39* 39* 42* 41* 43*  GLUCOSE 120* 114* 117* 127* 116*  BUN 28* 30* 31* 33* 32*  CREATININE 0.75 0.78 0.81 0.79 0.76  CALCIUM 9.0 8.9 8.8 8.6 8.6    Liver Function Tests:  Recent Labs Lab 04/22/14 1057  ALBUMIN 2.5*   Coags:  Recent Labs Lab 04/24/14 0405 04/25/14 0500 04/26/14 0507 04/27/14 0355 04/28/14 0413  INR 4.78* 5.39* 5.07* 3.81* 3.83*   No results found for this basename: PTT,  in the last 168 hours  CBC:  Recent Labs Lab 04/22/14 0244 04/24/14 1200 04/25/14 2103 04/26/14 0507 04/27/14 0355  WBC 7.7 13.8*  --  13.0* 15.3*  HGB 9.8* 10.9* 10.5* 9.9* 9.9*  HCT 35.2* 39.0 38.8 35.8* 37.1  MCV 81.9 83.3  --  83.4 83.4  PLT 307 408*  --  354 369    CBG:  Recent Labs Lab 04/27/14 1218 04/27/14 1710 04/27/14 2046 04/28/14 0728 04/28/14 1231  GLUCAP 126* 121* 100* 127* 118*    Recent Results (from the past 240 hour(s))  URINE CULTURE     Status: None   Collection Time    04/25/14  5:08 PM      Result Value Ref Range Status   Specimen Description URINE, CATHETERIZED   Final   Special Requests NONE   Final   Culture  Setup Time     Final   Value: 04/25/2014 19:12      [facility]   [place] Count     Final   Value: NO GROWTH     [facility]   Culture     Final   Value: NO GROWTH     [facility]formed at [facility]   Report Status 04/26/2014 FINAL   Final     Studies:  Recent x-ray studies have been reviewed in detail by the Attending Physician  Scheduled Meds:  Scheduled Meds: . antiseptic oral rinse  15 mL Mouth Rinse q12n4p  . buPROPion  300 mg Oral Daily  . chlorhexidine  15 mL Mouth Rinse BID  . docusate sodium  200 mg Oral BID  . feeding supplement (GLUCERNA SHAKE)  237 mL Oral TID BM  . insulin aspart  0-15 Units Subcutaneous TID WC  . insulin aspart  0-5 Units Subcutaneous QHS  . insulin glargine  20 Units Subcutaneous QHS  . metolazone  2.5 mg Oral BID  . potassium chloride  40 mEq Oral Once  . potassium chloride  40 mEq Oral BID  . simvastatin  20 mg Oral Daily  . warfarin  0.5 mg Oral ONCE-1800  . Warfarin - Pharmacist Dosing Inpatient   Does not apply q1800    Time spent on care of this patient: 25 mins   [facility] CampusMCCLUNG,JEFFREY T , MD   [facility] Office  [phone] Pager - Text Page per Amion as per below:  On-Call/Text Page:      [email or web address]      password [email or web address]  If 7PM-7AM, please contact night-coverage [email or web address] Password [email or web address] 04/28/2014, 2:56 PM   LOS: 13 days

## 2014-04-28 NOTE — Progress Notes (Signed)
Patient Name: Robin [name] Date of Encounter: 04/28/2014     Principal Problem:   Acute respiratory failure with hypoxia and hypercarbia Active Problems:   Varicose veins of lower extremities with ulcer   Atrial fibrillation   Ulcers of both lower legs, chronic venous stasis   Acute diastolic CHF (congestive heart failure)   Diabetes mellitus type 2 in obese   Acute respiratory failure with hypoxia   CKD (chronic kidney disease), stage III   Diabetic nephropathy associated with diabetes mellitus due to underlying condition   Morbid obesity    SUBJECTIVE  Denies chest pain or increased dyspnea.  Rhythm atrial fibrillation with controlled VR. Not on any rate-reducing meds.  CURRENT MEDS . antiseptic oral rinse  15 mL Mouth Rinse q12n4p  . buPROPion  300 mg Oral Daily  . chlorhexidine  15 mL Mouth Rinse BID  . docusate sodium  200 mg Oral BID  . feeding supplement (GLUCERNA SHAKE)  237 mL Oral TID BM  . insulin aspart  0-15 Units Subcutaneous TID WC  . insulin aspart  0-5 Units Subcutaneous QHS  . insulin glargine  20 Units Subcutaneous QHS  . metolazone  2.5 mg Oral BID  . potassium chloride  40 mEq Oral Once  . potassium chloride  40 mEq Oral BID  . simvastatin  20 mg Oral Daily  . warfarin  0.5 mg Oral ONCE-1800  . Warfarin - Pharmacist Dosing Inpatient   Does not apply q1800    OBJECTIVE  Filed Vitals:   04/28/14 0300 04/28/14 0344 04/28/14 0802 04/28/14 0830  BP:  134/69 143/62   Pulse: 70 85 88 75  Temp:  98.9 F (37.2 C) 98.3 F (36.8 C)   TempSrc:  Axillary Axillary   Resp: 32 23 20 29   Height:      Weight:  282 lb 10.1 oz (128.2 kg)    SpO2: 92% 95% 91% 94%    Intake/Output Summary (Last 24 hours) at 04/28/14 1158 Last data filed at 04/28/14 0700  Gross per 24 hour  Intake 467.33 ml  Output   1825 ml  Net -1357.67 ml   Filed Weights   04/24/14 0340 04/25/14 0504 04/28/14 0344  Weight: 283 lb 11.7 oz (128.7 kg) 294 lb 12.1 oz (133.7 kg)  282 lb 10.1 oz (128.2 kg)    PHYSICAL EXAM  General: Pleasant, NAD. Neuro: Alert and oriented X 3. Moves all extremities spontaneously. Psych: Normal affect. HEENT:  Normal  Neck: Supple without bruits or JVD. Lungs:  Resp regular and unlabored, Clear anteriorly. Heart: RRR no s3, s4, or murmurs. Abdomen: Soft, non-tender, non-distended, BS + x 4.  Extremities: Legs wrapped. Accessory Clinical Findings  CBC  Recent Labs  04/26/14 0507 04/27/14 0355  WBC 13.0* 15.3*  HGB 9.9* 9.9*  HCT 35.8* 37.1  MCV 83.4 83.4  PLT 354 369   Basic Metabolic Panel  Recent Labs  04/27/14 0355 04/28/14 0413  NA 144 146  K 3.9 3.4*  CL 91* 93*  CO2 41* 43*  GLUCOSE 127* 116*  BUN 33* 32*  CREATININE 0.79 0.76  CALCIUM 8.6 8.6   Liver Function Tests No results found for this basename: AST, ALT, ALKPHOS, BILITOT, PROT, ALBUMIN,  in the last 72 hours No results found for this basename: LIPASE, AMYLASE,  in the last 72 hours Cardiac Enzymes No results found for this basename: CKTOTAL, CKMB, CKMBINDEX, TROPONINI,  in the last 72 hours BNP No components found with this basename: POCBNP,  D-Dimer No results found for this basename: DDIMER,  in the last 72 hours Hemoglobin A1C No results found for this basename: HGBA1C,  in the last 72 hours Fasting Lipid Panel No results found for this basename: CHOL, HDL, LDLCALC, TRIG, CHOLHDL, LDLDIRECT,  in the last 72 hours Thyroid Function Tests No results found for this basename: TSH, T4TOTAL, FREET3, T3FREE, THYROIDAB,  in the last 72 hours  TELE  Atrial fib with controlled VR  ECG    Radiology/Studies  Dg Chest 2 View  04/18/2014   CLINICAL DATA:  Shortness of breath, mean to RIGHT side, history hypertension, diabetes, atrial fibrillation, coronary artery disease  EXAM: CHEST  2 VIEW  COMPARISON:  03/16/2014  FINDINGS: Minimal enlargement of cardiac silhouette.  Pulmonary vascular congestion.  Small RIGHT pleural effusion.  New  opacification of the RIGHT lower lobe compatible with pneumonia.  Associated volume loss in RIGHT hemi thorax.  Atelectasis versus consolidation in retrocardiac LEFT lower lobe as well.  No pneumothorax.  Bones demineralized.  IMPRESSION: Enlargement of cardiac silhouette with pulmonary vascular congestion.  BILATERAL new lower lobe consolidation compatible with pneumonia.  Small RIGHT pleural effusion.   Electronically Signed   By: Ulyses Southward M.D.   On: 04/12/2014 10:29   Dg Chest Port 1 View  04/27/2014   CLINICAL DATA:  Pulmonary edema.  EXAM: PORTABLE CHEST - 1 VIEW  COMPARISON:  04/25/2014.  FINDINGS: 1903 hrs. Patient is rotated to the right. The diffuse bilateral airspace disease persists and appears more confluent on the left. Right PICC line remains in place. Bilateral pleural effusions persist, left greater than right. Cardiopericardial silhouette not well seen secondary to the degree of airspace disease. Telemetry leads overlie the chest.  IMPRESSION: Persistent bilateral diffuse airspace opacity, slightly more confluent on the left in the interval.  Bilateral pleural effusions.   Electronically Signed   By: Kennith Center M.D.   On: 04/27/2014 19:14   Dg Chest Port 1 View  04/25/2014   CLINICAL DATA:  Airspace disease  EXAM: PORTABLE CHEST - 1 VIEW  COMPARISON:  Yesterday  FINDINGS: Stable right PICC. Stable diffuse airspace disease within the right lung and stable small right pleural effusion. Diminished opacity in the left lung compatible with a combination of worsening airspace disease and pleural fluid. No pneumothorax. Normal heart size.  IMPRESSION: Stable right pleural effusion and right airspace disease.  Improved left airspace disease and left pleural effusion.   Electronically Signed   By: Maryclare Bean M.D.   On: 04/25/2014 08:05   Dg Chest Port 1 View  04/24/2014   CLINICAL DATA:  Acute respiratory failure  EXAM: PORTABLE CHEST - 1 VIEW  COMPARISON:  04/22/2014  FINDINGS: Cardiomegaly  again noted. Study is limited by patient's large body habitus. Again noted diffuse bilateral lung opacities. Stable consolidation left lower lobe. Stable right Port-A-Cath position. Small bilateral pleural effusion.  IMPRESSION: No significant change. Again noted diffuse bilateral lung opacities and left lower lobe consolidation. Small bilateral pleural effusion.   Electronically Signed   By: Natasha Mead M.D.   On: 04/24/2014 12:24   Dg Chest Port 1 View  04/22/2014   CLINICAL DATA:  Line placement.  EXAM: PORTABLE CHEST - 1 VIEW  COMPARISON:  04/20/2014  FINDINGS: The patient is rotated to the right. Right-sided PICC line has been placed with tip projecting at the expected level of the mid to lower SVC. Evaluation of the cardiac silhouette is limited by rotation and partial obscuration by parenchymal  lung opacities. Widespread parenchymal lung opacities are again seen. The patient has taken a slightly greater inspiration with slightly improved aeration of the right lung base. Lung opacities otherwise do not appear significantly changed, with dense consolidation in the left lower lobe. Small bilateral pleural effusions are suspected. No definite pneumothorax is identified.  IMPRESSION: 1. Right PICC with tip at the level of the mid the lower SVC. 2. Slightly greater inspiration with slightly improved aeration of the right lung base. Otherwise unchanged appearance of diffuse bilateral lung opacities with likely small bilateral pleural effusions.   Electronically Signed   By: Sebastian AcheAllen  Grady   On: 04/22/2014 17:59   Dg Chest Port 1 View  04/20/2014   CLINICAL DATA:  Hypercarbic respiratory failure  EXAM: PORTABLE CHEST - 1 VIEW  COMPARISON:  04/24/2014  FINDINGS: Bilateral small pleural effusions. Bilateral interstitial and alveolar airspace opacities. No pneumothorax. Assessment of the cardiac silhouette is difficult secondary to the presence of pulmonary disease. Unremarkable osseous structures.  IMPRESSION: 1.  Bilateral interstitial and alveolar airspace opacities with bilateral small pleural effusions. Differential diagnosis includes pulmonary edema versus multilobar pneumonia versus ARDS.   Electronically Signed   By: Elige KoHetal  Patel   On: 04/20/2014 21:31    ASSESSMENT AND PLAN 1. Acute on chronic systolic and diastolic heart failure. Improving on IV lasix drip. Renal function stable. 2. Atrial fibrillation, rate control is adequate.   Signed, Cassell Clementhomas Skyah Hannon MD

## 2014-04-28 NOTE — Progress Notes (Signed)
ANTICOAGULATION CONSULT NOTE - Follow Up Consult  Pharmacy Consult for coumadin Indication: atrial fibrillation  No Known Allergies  Patient Measurements: Height: 5\' 6"  (167.6 cm) Weight: 282 lb 10.1 oz (128.2 kg) IBW/kg (Calculated) : 59.3 Heparin Dosing Weight:   Vital Signs: Temp: 98.9 F (37.2 C) (07/26 0344) Temp src: Axillary (07/26 0344) BP: 134/69 mmHg (07/26 0344) Pulse Rate: 85 (07/26 0344)  Labs:  Recent Labs  04/25/14 2103 04/26/14 0507 04/27/14 0355 04/28/14 0413  HGB 10.5* 9.9* 9.9*  --   HCT 38.8 35.8* 37.1  --   PLT  --  354 369  --   LABPROT  --  46.9* 37.5* 37.7*  INR  --  5.07* 3.81* 3.83*  CREATININE  --  0.81 0.79 0.76    Estimated Creatinine Clearance: 101.3 ml/min (by C-G formula based on Cr of 0.76).   Medications:  Scheduled:  . antiseptic oral rinse  15 mL Mouth Rinse q12n4p  . buPROPion  300 mg Oral Daily  . chlorhexidine  15 mL Mouth Rinse BID  . docusate sodium  200 mg Oral BID  . feeding supplement (GLUCERNA SHAKE)  237 mL Oral TID BM  . insulin aspart  0-15 Units Subcutaneous TID WC  . insulin aspart  0-5 Units Subcutaneous QHS  . insulin glargine  20 Units Subcutaneous QHS  . metolazone  2.5 mg Oral BID  . potassium chloride  40 mEq Oral Once  . potassium chloride  40 mEq Oral BID  . simvastatin  20 mg Oral Daily  . Warfarin - Pharmacist Dosing Inpatient   Does not apply q1800   Infusions:  . furosemide (LASIX) infusion 20 mg/hr (04/28/14 0600)    Assessment: 63 yo female with afib is currently on supratherapeutic coumadin.  INR today stable at 3.83; elevated INR due to hepatic causes? Patient was on coumadin 5mg  po daily prior to admission.  Goal of Therapy:  INR 2-3 Monitor platelets by anticoagulation protocol: Yes   Plan:  1) Coumadin 0.5mg  po x1 2) INR in am   Niquan Charnley, Tsz-Yin 04/28/2014,6:58 AM

## 2014-04-29 ENCOUNTER — Inpatient Hospital Stay (HOSPITAL_COMMUNITY): Payer: BC Managed Care – PPO

## 2014-04-29 ENCOUNTER — Encounter (HOSPITAL_COMMUNITY): Payer: Self-pay | Admitting: Radiology

## 2014-04-29 DIAGNOSIS — L97809 Non-pressure chronic ulcer of other part of unspecified lower leg with unspecified severity: Secondary | ICD-10-CM | POA: Diagnosis not present

## 2014-04-29 DIAGNOSIS — Z4800 Encounter for change or removal of nonsurgical wound dressing: Secondary | ICD-10-CM

## 2014-04-29 DIAGNOSIS — L89309 Pressure ulcer of unspecified buttock, unspecified stage: Secondary | ICD-10-CM

## 2014-04-29 DIAGNOSIS — L97309 Non-pressure chronic ulcer of unspecified ankle with unspecified severity: Secondary | ICD-10-CM | POA: Diagnosis not present

## 2014-04-29 DIAGNOSIS — I10 Essential (primary) hypertension: Secondary | ICD-10-CM

## 2014-04-29 DIAGNOSIS — L8992 Pressure ulcer of unspecified site, stage 2: Secondary | ICD-10-CM

## 2014-04-29 DIAGNOSIS — J810 Acute pulmonary edema: Secondary | ICD-10-CM

## 2014-04-29 DIAGNOSIS — I4891 Unspecified atrial fibrillation: Secondary | ICD-10-CM

## 2014-04-29 DIAGNOSIS — I872 Venous insufficiency (chronic) (peripheral): Secondary | ICD-10-CM | POA: Diagnosis not present

## 2014-04-29 DIAGNOSIS — I739 Peripheral vascular disease, unspecified: Secondary | ICD-10-CM | POA: Diagnosis not present

## 2014-04-29 LAB — BLOOD GAS, ARTERIAL
ACID-BASE EXCESS: 19.7 mmol/L — AB (ref 0.0–2.0)
Bicarbonate: 45.5 mEq/L — ABNORMAL HIGH (ref 20.0–24.0)
DELIVERY SYSTEMS: POSITIVE
DRAWN BY: 365291
EXPIRATORY PAP: 6
FIO2: 0.85 %
Inspiratory PAP: 12
O2 SAT: 85.1 %
PATIENT TEMPERATURE: 98.6
TCO2: 47.7 mmol/L (ref 0–100)
pCO2 arterial: 70.5 mmHg (ref 35.0–45.0)
pH, Arterial: 7.426 (ref 7.350–7.450)
pO2, Arterial: 52.7 mmHg — ABNORMAL LOW (ref 80.0–100.0)

## 2014-04-29 LAB — CBC
HCT: 34.4 % — ABNORMAL LOW (ref 36.0–46.0)
Hemoglobin: 9.5 g/dL — ABNORMAL LOW (ref 12.0–15.0)
MCH: 23.1 pg — AB (ref 26.0–34.0)
MCHC: 27.6 g/dL — ABNORMAL LOW (ref 30.0–36.0)
MCV: 83.5 fL (ref 78.0–100.0)
PLATELETS: 366 10*3/uL (ref 150–400)
RBC: 4.12 MIL/uL (ref 3.87–5.11)
RDW: 20.8 % — AB (ref 11.5–15.5)
WBC: 15.2 10*3/uL — ABNORMAL HIGH (ref 4.0–10.5)

## 2014-04-29 LAB — BASIC METABOLIC PANEL
Anion gap: 9 (ref 5–15)
BUN: 36 mg/dL — AB (ref 6–23)
CALCIUM: 8.4 mg/dL (ref 8.4–10.5)
CO2: 43 mEq/L (ref 19–32)
Chloride: 91 mEq/L — ABNORMAL LOW (ref 96–112)
Creatinine, Ser: 0.79 mg/dL (ref 0.50–1.10)
GFR calc Af Amer: >90 mL/min (ref >90)
GFR, EST NON AFRICAN AMERICAN: 88 mL/min — AB (ref >90)
Glucose, Bld: 136 mg/dL — ABNORMAL HIGH (ref 70–99)
Potassium: 3.3 mEq/L — ABNORMAL LOW (ref 3.7–5.3)
Sodium: 143 mEq/L (ref 137–147)

## 2014-04-29 LAB — GLUCOSE, CAPILLARY
GLUCOSE-CAPILLARY: 139 mg/dL — AB (ref 70–99)
Glucose-Capillary: 112 mg/dL — ABNORMAL HIGH (ref 70–99)
Glucose-Capillary: 112 mg/dL — ABNORMAL HIGH (ref 70–99)
Glucose-Capillary: 99 mg/dL (ref 70–99)

## 2014-04-29 LAB — SEDIMENTATION RATE: Sed Rate: 36 mm/hr — ABNORMAL HIGH (ref 0–22)

## 2014-04-29 LAB — PROCALCITONIN: Procalcitonin: 0.17 ng/mL

## 2014-04-29 LAB — MRSA PCR SCREENING: MRSA BY PCR: NEGATIVE

## 2014-04-29 LAB — PROTIME-INR
INR: 5.16 (ref 0.00–1.49)
Prothrombin Time: 47.1 seconds — ABNORMAL HIGH (ref 11.6–15.2)

## 2014-04-29 LAB — C-REACTIVE PROTEIN: CRP: 9.1 mg/dL — ABNORMAL HIGH (ref <0.60)

## 2014-04-29 MED ORDER — LORAZEPAM 2 MG/ML IJ SOLN: 1.0000 mg | Freq: Once | INTRAMUSCULAR | Status: AC | PRN

## 2014-04-29 MED ORDER — SODIUM CHLORIDE 0.9 % IV SOLN: INTRAVENOUS | Status: DC

## 2014-04-29 MED ORDER — BIOTENE DRY MOUTH MT LIQD
15.0000 mL | Freq: Four times a day (QID) | OROMUCOSAL | Status: DC
  Administered 2014-04-30 (×3): 15 mL via OROMUCOSAL

## 2014-04-29 MED ORDER — LEVOFLOXACIN IN D5W 750 MG/150ML IV SOLN
750.0000 mg | INTRAVENOUS | Status: DC
  Administered 2014-04-29 – 2014-04-30 (×2): 750 mg via INTRAVENOUS
  Filled 2014-04-29 (×2): qty 150

## 2014-04-29 MED ORDER — INSULIN GLARGINE 100 UNIT/ML ~~LOC~~ SOLN
12.0000 [IU] | Freq: Every day | SUBCUTANEOUS | Status: DC
  Filled 2014-04-29 (×2): qty 0.12

## 2014-04-29 NOTE — Progress Notes (Signed)
[facility] TEAM 1 - Stepdown/ICU TEAM Progress Note  JACQUELINA HEWINS YNW:[id number] DOB: 1950/12/04 DOA: 05/01/2014 PCP: [name], MD  Admit HPI / Brief Narrative: [name] with acute on chronic respiratory failure, OSA/OHS, chronic diastolic heart failure, admitted 7/13 for SOB. Had persistent SOB despite BiPAP and diuresis.  HPI/Subjective: Over the last 48hrs the pt has declined.  She remains alert and oriented, but despite ongoing diuresis, she is no longer able to come off the BIPAP w/o experiencing acute decline in O2 saturations into the 70s.  She denies any new sx.  No cp, n/v, or abdom pain.  She is very thirsty.    Assessment/Plan:  Acute on chronic respiratory failure with hypoxemia and hypercarbia  Attempting to wean from BIPAP but unable to do so - in that pt appears to be losing ground, I have asked Pulm to come back and see her again   Acute exacerbation on chronic diastolic CHF  Cardiology following - attempts to diurese continue, w/ pt currently negative ~14L - lasix gtt continues   Chronic Atrial fibrillation  On chronic warfarin - rate controlled   Diabetes mellitus type 2 CBG well controlled - pt unable to eat due to 24hr BIPAP dependence - decrease tx and follow   HTN BP well controlled  Pyuria Has completed a full course of abx tx  Acute on chronic renal failure (CKD stage III)  crt is stable   Venous stasis dermatitis  continue Unna boots   Hypokalemia  Cont to replace and follow w/ goal of 4.0  Morbid obesity - Body mass index is 45.35 kg/(m^2).  Code Status: FULL Family Communication: spoke w/ pt and husband at bedside at length  Disposition Plan: SDU due to ongoing need for BIPAP  Consultants: Cardiology PCCM  Procedures: Echo 7/13 >>> EF 55-60%, diastolic fxn unable to be evaluated due to afib PICC 04/22/14  Antibiotics: Ceftriaxone 7/20 >>> 7/22  Diflucan 7/14 >>> 7/21  Vanc 7/13 >>> 7/21  Cefepime 7/13 >>>  7/14  DVT prophylaxis: warfarin  Objective: Blood pressure 131/67, pulse 83, temperature 97.8 F (36.6 C), temperature source Axillary, resp. rate 26, height 5\' 6"  (1.676 m), weight 127.4 kg (280 lb 13.9 oz), SpO2 92.00%.  Intake/Output Summary (Last 24 hours) at 04/29/14 1137 Last data filed at 04/29/14 0800  Gross per 24 hour  Intake    260 ml  Output   1500 ml  Net  -1240 ml   Exam: General: No acute respiratory distress on BIPAP Lungs: distant bs th/o all fields - no wheeze - fine crackles th/o  Cardiovascular: distant HS - irreg - rate controlled - no appreciable M or gallup  Abdomen: morbidly obese, nontender, soft, bowel sounds positive, no rebound Extremities: no significant cyanosis, clubbing bilateral lower extremities  Data Reviewed: Basic Metabolic Panel:  Recent Labs Lab 04/25/14 0500 04/26/14 0507 04/27/14 0355 04/28/14 0413 04/29/14 0445  NA 147 146 144 146 143  K 3.4* 3.6* 3.9 3.4* 3.3*  CL 96 94* 91* 93* 91*  CO2 39* 42* 41* 43* 43*  GLUCOSE 114* 117* 127* 116* 136*  BUN 30* 31* 33* 32* 36*  CREATININE 0.78 0.81 0.79 0.76 0.79  CALCIUM 8.9 8.8 8.6 8.6 8.4    Liver Function Tests: No results found for this basename: AST, ALT, ALKPHOS, BILITOT, PROT, ALBUMIN,  in the last 168 hours Coags:  Recent Labs Lab 04/25/14 0500 04/26/14 0507 04/27/14 0355 04/28/14 0413 04/29/14 0445  INR 5.39* 5.07* 3.81*  3.83* 5.16*   No results found for this basename: PTT,  in the last 168 hours  CBC:  Recent Labs Lab 04/24/14 1200 04/25/14 2103 04/26/14 0507 04/27/14 0355 04/29/14 0445  WBC 13.8*  --  13.0* 15.3* 15.2*  HGB 10.9* 10.5* 9.9* 9.9* 9.5*  HCT 39.0 38.8 35.8* 37.1 34.4*  MCV 83.3  --  83.4 83.4 83.5  PLT 408*  --  354 369 366    CBG:  Recent Labs Lab 04/28/14 0728 04/28/14 1231 04/28/14 1624 04/28/14 2207 04/29/14 0725  GLUCAP 127* 118* 107* 139* 112*    Recent Results (from the past 240 hour(s))  URINE CULTURE     Status:  None   Collection Time    04/25/14  5:08 PM      Result Value Ref Range Status   Specimen Description URINE, CATHETERIZED   Final   Special Requests NONE   Final   Culture  Setup Time     Final   Value: 04/25/2014 19:12     [facility]formed at [facility]   Colony Count     Final   Value: NO GROWTH     [facility]formed at [facility]   Culture     Final   Value: NO GROWTH     Performed at [facility]   Report Status 04/26/2014 FINAL   Final     Studies:  Recent x-ray studies have been reviewed in detail by the Attending Physician  Scheduled Meds:  Scheduled Meds: . antiseptic oral rinse  15 mL Mouth Rinse q12n4p  . buPROPion  300 mg Oral Daily  . chlorhexidine  15 mL Mouth Rinse BID  . docusate sodium  200 mg Oral BID  . feeding supplement (GLUCERNA SHAKE)  237 mL Oral TID BM  . insulin aspart  0-15 Units Subcutaneous TID WC  . insulin aspart  0-5 Units Subcutaneous QHS  . insulin glargine  20 Units Subcutaneous QHS  . metolazone  2.5 mg Oral BID  . potassium chloride  40 mEq Oral TID  . simvastatin  20 mg Oral Daily  . Warfarin - Pharmacist Dosing Inpatient   Does not apply q1800    Time spent on care of this patient: 35 mins   [facility] LL[facility]CCLUNG,Dametra Whetsel T , MD   [facility] Office  [phone] Pager - Text Page per Amion as per below:  On-Call/Text Page:      [email or web address]      password [email or web address]  If 7PM-7AM, please contact night-coverage [email or web address] Password [email or web address] 04/29/2014, 11:37 AM   LOS: 14 days

## 2014-04-29 NOTE — Progress Notes (Signed)
O2 SATs 87-89% on BiPAP. RN went to gather influenza panel and once BiPAP removed only long enough to collect swab SATs down to 77%. Notified attending and eLink doctor.  No new orders at this time. Will continue to monitor.

## 2014-04-29 NOTE — Progress Notes (Signed)
PCCM Interval Note   Reassessed patient on BiPAP. She has had minimal clinical improvement over several days of NIMV and is becoming more and more hypoxemic on ABG. Most recent pO2 52.7.   ABG    Component Value Date/Time   PHART 7.426 04/29/2014 1615   PCO2ART 70.5* 04/29/2014 1615   PO2ART 52.7* 04/29/2014 1615   HCO3 45.5* 04/29/2014 1615   TCO2 47.7 04/29/2014 1615   O2SAT 85.1 04/29/2014 1615   Plan: It appears as though she will require intubation, based on worsening hypoxemia despite continuous BiPAP. Have spoken with patient and husband who both agree that this would be appropriate if she declines. Both would like to avoid if possible.  Will move to ICU, low threshold to proceed with intubation and mechanical ventilation. Suspect will need high PEEP ventilation.   Joneen RoachPaul Hoffman, ACNP [place]/Critical [facility] Pager [phone] or [phone]  60 minutes additional CC time  Levy Pupaobert Caera Enwright, MD, PhD 04/29/2014, 8:44 PM  Pulmonary and Critical Care [phone] or if no answer [phone]

## 2014-04-29 NOTE — Clinical Social Work Note (Signed)
CSW continues to follow for DC needs.   Bryant Kem Hensen MSW, LCSWA, LCASA, [phone] 

## 2014-04-29 NOTE — Progress Notes (Signed)
Patient Name: Robin [name] Date of Encounter: 04/29/2014     Principal Problem:   Acute respiratory failure with hypoxia and hypercarbia Active Problems:   Varicose veins of lower extremities with ulcer   Atrial fibrillation   Ulcers of both lower legs, chronic venous stasis   Acute diastolic CHF (congestive heart failure)   Diabetes mellitus type 2 in obese   Acute respiratory failure with hypoxia   CKD (chronic kidney disease), stage III   Diabetic nephropathy associated with diabetes mellitus due to underlying condition   Morbid obesity    SUBJECTIVE  Per nursing staff, continue to have frequent O2 desat despite increasing diuresis since Sat. Currently on 48m/hr lasix gtt, -1L to - 1.5 L per day, net -13L since admission. Primary team has a hard time getting patient off of BiPAP (removal of which met with O2 desat) Patient took off BiPAP last night due to discomfort.  CURRENT MEDS . antiseptic oral rinse  15 mL Mouth Rinse q12n4p  . buPROPion  300 mg Oral Daily  . chlorhexidine  15 mL Mouth Rinse BID  . docusate sodium  200 mg Oral BID  . feeding supplement (GLUCERNA SHAKE)  237 mL Oral TID BM  . insulin aspart  0-15 Units Subcutaneous TID WC  . insulin aspart  0-5 Units Subcutaneous QHS  . insulin glargine  20 Units Subcutaneous QHS  . metolazone  2.5 mg Oral BID  . potassium chloride  40 mEq Oral TID  . simvastatin  20 mg Oral Daily  . Warfarin - Pharmacist Dosing Inpatient   Does not apply q1800    OBJECTIVE  Filed Vitals:   04/29/14 0416 04/29/14 0700 04/29/14 0730 04/29/14 0848  BP: 151/76  131/67   Pulse: 92   83  Temp: 98 F (36.7 C) 97.8 F (36.6 C) 97.8 F (36.6 C)   TempSrc: Oral Axillary Axillary   Resp: 21   26  Height:      Weight: 280 lb 13.9 oz (127.4 kg)     SpO2: 91%   92%    Intake/Output Summary (Last 24 hours) at 04/29/14 0953 Last data filed at 04/29/14 0800  Gross per 24 hour  Intake    260 ml  Output   1500 ml  Net  -1240  ml   Filed Weights   04/25/14 0504 04/28/14 0344 04/29/14 0416  Weight: 294 lb 12.1 oz (133.7 kg) 282 lb 10.1 oz (128.2 kg) 280 lb 13.9 oz (127.4 kg)    PHYSICAL EXAM  General: Pleasant, NAD. Neuro: Alert and oriented X 3. Moves all extremities spontaneously. Psych: Normal affect. HEENT:  Normal  Neck: Supple without bruits or JVD. Lungs:  Resp regular. No air movement in bilateral bases, minimal inspiratory air movement with BiPAP, no expiratory phase audible. Heart: irregular no s3, s4, or murmurs. Abdomen: Soft, non-tender, non-distended, BS + x 4.  Extremities: No clubbing, cyanosis. DP/PT/Radials 2+ and equal bilaterally. 1-2+ edema in bilateral LE, RUE appears to be swollen  Accessory Clinical Findings  CBC  Recent Labs  04/27/14 0355 04/29/14 0445  WBC 15.3* 15.2*  HGB 9.9* 9.5*  HCT 37.1 34.4*  MCV 83.4 83.5  PLT 369 3884  Basic Metabolic Panel  Recent Labs  04/28/14 0413 04/29/14 0445  NA 146 143  K 3.4* 3.3*  CL 93* 91*  CO2 43* 43*  GLUCOSE 116* 136*  BUN 32* 36*  CREATININE 0.76 0.79  CALCIUM 8.6 8.4    TELE  A-fib  with HR 70-80s  ECG  No recent EKG  Radiology/Studies  Dg Chest 2 View  04/17/2014   CLINICAL DATA:  Shortness of breath, mean to RIGHT side, history hypertension, diabetes, atrial fibrillation, coronary artery disease  EXAM: CHEST  2 VIEW  COMPARISON:  03/16/2014  FINDINGS: Minimal enlargement of cardiac silhouette.  Pulmonary vascular congestion.  Small RIGHT pleural effusion.  New opacification of the RIGHT lower lobe compatible with pneumonia.  Associated volume loss in RIGHT hemi thorax.  Atelectasis versus consolidation in retrocardiac LEFT lower lobe as well.  No pneumothorax.  Bones demineralized.  IMPRESSION: Enlargement of cardiac silhouette with pulmonary vascular congestion.  BILATERAL new lower lobe consolidation compatible with pneumonia.  Small RIGHT pleural effusion.   Electronically Signed   By: Lavonia Dana M.D.   On:  04/09/2014 10:29   Dg Chest Port 1 View  04/27/2014   CLINICAL DATA:  Pulmonary edema.  EXAM: PORTABLE CHEST - 1 VIEW  COMPARISON:  04/25/2014.  FINDINGS: 1903 hrs. Patient is rotated to the right. The diffuse bilateral airspace disease persists and appears more confluent on the left. Right PICC line remains in place. Bilateral pleural effusions persist, left greater than right. Cardiopericardial silhouette not well seen secondary to the degree of airspace disease. Telemetry leads overlie the chest.  IMPRESSION: Persistent bilateral diffuse airspace opacity, slightly more confluent on the left in the interval.  Bilateral pleural effusions.   Electronically Signed   By: Misty Stanley M.D.   On: 04/27/2014 19:14   Dg Chest Port 1 View  04/25/2014   CLINICAL DATA:  Airspace disease  EXAM: PORTABLE CHEST - 1 VIEW  COMPARISON:  Yesterday  FINDINGS: Stable right PICC. Stable diffuse airspace disease within the right lung and stable small right pleural effusion. Diminished opacity in the left lung compatible with a combination of worsening airspace disease and pleural fluid. No pneumothorax. Normal heart size.  IMPRESSION: Stable right pleural effusion and right airspace disease.  Improved left airspace disease and left pleural effusion.   Electronically Signed   By: Maryclare Bean M.D.   On: 04/25/2014 08:05   Dg Chest Port 1 View  04/24/2014   CLINICAL DATA:  Acute respiratory failure  EXAM: PORTABLE CHEST - 1 VIEW  COMPARISON:  04/22/2014  FINDINGS: Cardiomegaly again noted. Study is limited by patient's large body habitus. Again noted diffuse bilateral lung opacities. Stable consolidation left lower lobe. Stable right Port-A-Cath position. Small bilateral pleural effusion.  IMPRESSION: No significant change. Again noted diffuse bilateral lung opacities and left lower lobe consolidation. Small bilateral pleural effusion.   Electronically Signed   By: Lahoma Crocker M.D.   On: 04/24/2014 12:24   Dg Chest Port 1  View  04/22/2014   CLINICAL DATA:  Line placement.  EXAM: PORTABLE CHEST - 1 VIEW  COMPARISON:  04/20/2014  FINDINGS: The patient is rotated to the right. Right-sided PICC line has been placed with tip projecting at the expected level of the mid to lower SVC. Evaluation of the cardiac silhouette is limited by rotation and partial obscuration by parenchymal lung opacities. Widespread parenchymal lung opacities are again seen. The patient has taken a slightly greater inspiration with slightly improved aeration of the right lung base. Lung opacities otherwise do not appear significantly changed, with dense consolidation in the left lower lobe. Small bilateral pleural effusions are suspected. No definite pneumothorax is identified.  IMPRESSION: 1. Right PICC with tip at the level of the mid the lower SVC. 2. Slightly greater inspiration  with slightly improved aeration of the right lung base. Otherwise unchanged appearance of diffuse bilateral lung opacities with likely small bilateral pleural effusions.   Electronically Signed   By: Logan Bores   On: 04/22/2014 17:59   Dg Chest Port 1 View  04/20/2014   CLINICAL DATA:  Hypercarbic respiratory failure  EXAM: PORTABLE CHEST - 1 VIEW  COMPARISON:  04/29/2014  FINDINGS: Bilateral small pleural effusions. Bilateral interstitial and alveolar airspace opacities. No pneumothorax. Assessment of the cardiac silhouette is difficult secondary to the presence of pulmonary disease. Unremarkable osseous structures.  IMPRESSION: 1. Bilateral interstitial and alveolar airspace opacities with bilateral small pleural effusions. Differential diagnosis includes pulmonary edema versus multilobar pneumonia versus ARDS.   Electronically Signed   By: Kathreen Devoid   On: 04/20/2014 21:31    ASSESSMENT AND PLAN  1. Acute on chronic resp insufficiency  - problematic to wean off BiPAP despite diuresis (affect of diuresis lower than expected, however still put out 18 L)  - low lung volume  on exam  - acute on chronic diastolic HF appears to be only a fraction of the problem, her body habitus is contributing to slow recovery and respiratory issues  2. Acute on chronic diastolic HF  - continue diuresis, Cr stable  3. Chronic a-fib, stable  - INR supratherapeutic, 5.16 this am  - coumadin managed by pharmacy  4. R arm swelling  - same side as picc line  - denies any prior h/o DVT  - informed nursing staff, appears to be the depend side, will elevate for now  - further workup per primary team, on coumadin  Signed, Woodward Ku Pager: [phone]  I have personally seen and examined this patient with Almyra Deforest, PA-c. I agree with the assessment and plan as outlined above. She continues to diurese well with IV Lasix. Renal function is stable, slight increase in BUN. Would continue IV Lasix drip today in attempt to optimize her respiratory status and recheck BUN/creat in am. Agree that respiratory issues are multi-factorial including OSA/OHS. She has chronic atrial fib, rate is controlled on no therapy. Coumadin per pharmacy.   MCALHANY,CHRISTOPHER 04/29/2014 12:47 PM

## 2014-04-29 NOTE — Progress Notes (Signed)
Utilization review completed.  

## 2014-04-29 NOTE — Progress Notes (Signed)
PT Cancellation Note  Patient Details Name: Robin BaizeJudith M Grillo MRN: [id number] DOB: 05/19/1951   Cancelled Treatment:    Reason Eval/Treat Not Completed: Medical issues which prohibited therapy.  Attempted to see patient this am.  Patient with decreased O2 sats on BIPAP.  RN requested we hold PT today.  Will return tomorrow for PT as appropriate.   Vena AustriaDavis, Veniamin Kincaid H 04/29/2014, 6:45 PM Durenda HurtSusan H. Renaldo Fiddleravis, PT, [facility] Acute Rehab Services Pager [phone]

## 2014-04-29 NOTE — Progress Notes (Signed)
PULMONARY / CRITICAL CARE MEDICINE  Name: Robin [name] MRN: [id number] DOB: Dec 07, 1950    ADMISSION DATE:  04/19/2014 CONSULTATION DATE:  04/24/2014  REFERRING MD :  Tat PRIMARY SERVICE:  TRH  CHIEF COMPLAINT:  SOB  BRIEF PATIENT DESCRIPTION: 63 yo morbidly obese with OSA/OHS, chronic respiratory failure, chronic diastolic heart failure, admitted 7/13 with dyspnea. PCCM has been consulted twice this admission for dyspnea and hypoxia. Both times she has seemingly had improvement with BiPAP and diuresis. Now being asked to see for refractory dyspnea and hypoxia despite negative fluid balance and BiPAP.   SIGNIFICANT EVENTS / STUDIES:  7/13  TTE >>> EF 55-60%, LA and RA dilation, diastolic fxn unable to be evaluated due to a-fib.    LINES / TUBES: Right PICC 7/20 >>>  CULTURES: Blood 7/13 >>> neg Urine 7/13 >>> neg  ANTIBIOTICS: Ceftriaxone 7/20 >>> 7/22 Diflucan 7/14 >>> 7/21 Vanc 7/13 >>> 7/21 Cefepime 7/13 >>> 7/14  INTERVAL HISTORY: Per nursing staff, continue to have frequent O2 desat despite increasing diuresis since Sat. Currently on 60m/hr lasix gtt, net -13L since admission. Primary team has a hard time weaning from BiPAP due to hypoxia. Patient took off BiPAP last night due to discomfort.  VITAL SIGNS: Temp:  [97.7 F (36.5 C)-98.1 F (36.7 C)] 97.8 F (36.6 C) (07/27 0730) Pulse Rate:  [63-96] 83 (07/27 0848) Resp:  [21-38] 26 (07/27 0848) BP: (131-151)/(60-77) 131/67 mmHg (07/27 0730) SpO2:  [83 %-99 %] 92 % (07/27 0848) FiO2 (%):  [80 %-85 %] 85 % (07/27 0848) Weight:  [127.4 kg (280 lb 13.9 oz)] 127.4 kg (280 lb 13.9 oz) (07/27 0416)  PHYSICAL EXAMINATION: General: Morbidly obese female on BiPAP with mildly increased WOB.  Neuro: Alert, oriented. PERRL.  HEENT: [place]/AT, JVD difficult to determine d/t neck girth.  Cardiovascular: Irreg Irreg, Regular rate. No MRG Lungs: Mildly labored resps on BiPAP. Diminished, coarse breath sounds R >L. Desat's if  momentarily off NIMV.  Abdomen: Obese, soft, non-tender, non-distended.  Extremities: No acute deformity. +2 pitting edema to BLE.  Skin: Ecchymotic area to RUE around PICC line.   LABS:  Recent Labs Lab 04/27/14 0355 04/28/14 0413 04/29/14 0445  NA 144 146 143  K 3.9 3.4* 3.3*  CL 91* 93* 91*  CO2 41* 43* 43*  BUN 33* 32* 36*  CREATININE 0.79 0.76 0.79  GLUCOSE 127* 116* 136*    Recent Labs Lab 04/26/14 0507 04/27/14 0355 04/29/14 0445  HGB 9.9* 9.9* 9.5*  HCT 35.8* 37.1 34.4*  WBC 13.0* 15.3* 15.2*  PLT 354 369 366   IMAGING:   Dg Chest Port 1 View  04/27/2014   CLINICAL DATA:  Pulmonary edema.  EXAM: PORTABLE CHEST - 1 VIEW  COMPARISON:  04/25/2014.  FINDINGS: 1903 hrs. Patient is rotated to the right. The diffuse bilateral airspace disease persists and appears more confluent on the left. Right PICC line remains in place. Bilateral pleural effusions persist, left greater than right. Cardiopericardial silhouette not well seen secondary to the degree of airspace disease. Telemetry leads overlie the chest.  IMPRESSION: Persistent bilateral diffuse airspace opacity, slightly more confluent on the left in the interval.  Bilateral pleural effusions.   Electronically Signed   By: EMisty StanleyM.D.   On: 04/27/2014 19:14   ASSESSMENT / PLAN:  Acute on chronic hypoxic respiratory failure. Significant airspace disease Pulmonary edema is possible but unlikely given significant diuresis and discordance between volume status and degree of hypoxia Possible infectious / inflammatory etiology Morbid  obesity likely contributing to low lung volumes, low FRC and hypoxia  Bilateral effusions, which may be contributing to low lung volumes OSA / OHS, likely lead to hypercarbic but not hypoxemic respiratory failure Doubt PE given significant parenchymal disease Difficulty interpreting plain chest films given body habitus   Supplemental oxygen for SpO2>92  BiPAP / NIMV PRN  Chest CT for  better evaluation of parenchyma / pleural space  Continue diuresis  Would PA catheter be helpful?  Send ESR, CRP  Send PCT  Send respiratory viral panel  Start empirical Levaquin   Would not consider tracheostomy / mandatory vent support at this point as this seems to be an acute issues and the etiology of this is not quite clear  Will follow  Georgann Housekeeper, ACNP [facility]/Critical Care Pager [phone] or [phone]  I have personally obtained history, examined patient, evaluated and interpreted laboratory and imaging results, reviewed medical records, formulated assessment / plan and placed orders.  Doree Fudge, MD Pulmonary and [place] Pager: [phone]  04/29/2014, 1:50 PM

## 2014-04-29 NOTE — Progress Notes (Signed)
ANTICOAGULATION CONSULT NOTE - Follow Up Consult  Pharmacy Consult for warfarin  Indication: atrial fibrillation  No Known Allergies  Patient Measurements: Height: 5\' 6"  (167.6 cm) Weight: 280 lb 13.9 oz (127.4 kg) IBW/kg (Calculated) : 59.3   Vital Signs: Temp: 97.8 F (36.6 C) (07/27 0730) Temp src: Axillary (07/27 0730) BP: 131/67 mmHg (07/27 0730) Pulse Rate: 83 (07/27 0848)  Labs:  Recent Labs  04/27/14 0355 04/28/14 0413 04/29/14 0445  HGB 9.9*  --  9.5*  HCT 37.1  --  34.4*  PLT 369  --  366  LABPROT 37.5* 37.7* 47.1*  INR 3.81* 3.83* 5.16*  CREATININE 0.79 0.76 0.79    Estimated Creatinine Clearance: 100.8 ml/min (by C-G formula based on Cr of 0.79).   Medications:  Prescriptions prior to admission  Medication Sig Dispense Refill  . ALPRAZolam (XANAX) 0.5 MG tablet Take 1 tablet (0.5 mg total) by mouth 3 (three) times daily.  90 tablet  0  . bethanechol (URECHOLINE) 25 MG tablet Take 1 tablet (25 mg total) by mouth at bedtime.  30 tablet  0  . buPROPion (WELLBUTRIN XL) 300 MG 24 hr tablet Take 1 tablet (300 mg total) by mouth daily.  30 tablet  1  . furosemide (LASIX) 40 MG tablet Take 1 tablet (40 mg total) by mouth daily.  30 tablet  0  . HYDROcodone-acetaminophen (NORCO/VICODIN) 5-325 MG per tablet Take 1 tablet by mouth every 4 (four) hours as needed for moderate pain.  60 tablet  0  . insulin glargine (LANTUS) 100 UNIT/ML injection Inject 0.3 mLs (30 Units total) into the skin at bedtime.  10 mL  11  . nystatin (MYCOSTATIN/NYSTOP) 100000 UNIT/GM POWD Apply 1 g topically every evening.      . simvastatin (ZOCOR) 20 MG tablet Take 1 tablet (20 mg total) by mouth daily.  30 tablet  0  . sodium bicarbonate 650 MG tablet Take 1 tablet (650 mg total) by mouth 3 (three) times daily.  90 tablet  0  . warfarin (COUMADIN) 5 MG tablet Take 5 mg by mouth daily at 6 PM. Take as directed by coumadin clinic        Assessment: 63 yo female hx of afib. On warfarin  PTA, INR continues to be supratherapeutic today at 5.16. Last dose given 7/26 (0.5mg ). No bleeding noted, Hgb/Hct low but stable.   Goal of Therapy:  INR 2-3 Monitor platelets by anticoagulation protocol: Yes   Plan:  - No warfarin today - Daily PT/INR  Thank you for allowing pharmacy to be part of this patient's care team  Aby Gessel M. Rickardo Brinegar, Pharm.D Clinical Pharmacy Resident Pager: [phone] 04/29/2014 .10:18 AM

## 2014-04-29 NOTE — Progress Notes (Signed)
RN attempted to swab and suction pt's mouth, removed BiPAP and placed pt on 100% NRB. During transition pt O2 SATs dropped to 85%. RN waited 3 minutes to see if SATs would come back up, but they continued to drop. During transition back to BiPAP SATs dropped to 77%. Once back on BiPAP O2 SATs back up to 89%, but hanging within 87-89%. Pt currently unable to sustain O2 sats off bipap.  Notified attending, will continue to monitor.

## 2014-04-30 ENCOUNTER — Encounter (HOSPITAL_COMMUNITY): Payer: Self-pay

## 2014-04-30 LAB — GLUCOSE, CAPILLARY
GLUCOSE-CAPILLARY: 100 mg/dL — AB (ref 70–99)
GLUCOSE-CAPILLARY: 124 mg/dL — AB (ref 70–99)
GLUCOSE-CAPILLARY: 132 mg/dL — AB (ref 70–99)
GLUCOSE-CAPILLARY: 97 mg/dL (ref 70–99)
Glucose-Capillary: 106 mg/dL — ABNORMAL HIGH (ref 70–99)
Glucose-Capillary: 108 mg/dL — ABNORMAL HIGH (ref 70–99)
Glucose-Capillary: 113 mg/dL — ABNORMAL HIGH (ref 70–99)

## 2014-04-30 LAB — BASIC METABOLIC PANEL
Anion gap: 10 (ref 5–15)
BUN: 36 mg/dL — ABNORMAL HIGH (ref 6–23)
CALCIUM: 8.5 mg/dL (ref 8.4–10.5)
CO2: 45 mEq/L (ref 19–32)
CREATININE: 0.81 mg/dL (ref 0.50–1.10)
Chloride: 90 mEq/L — ABNORMAL LOW (ref 96–112)
GFR, EST AFRICAN AMERICAN: 89 mL/min — AB (ref >90)
GFR, EST NON AFRICAN AMERICAN: 77 mL/min — AB (ref >90)
GLUCOSE: 96 mg/dL (ref 70–99)
Potassium: 2.8 mEq/L — CL (ref 3.7–5.3)
Sodium: 145 mEq/L (ref 137–147)

## 2014-04-30 LAB — HEPATIC FUNCTION PANEL
ALT: 6 U/L (ref 0–35)
AST: 22 U/L (ref 0–37)
Albumin: 2.4 g/dL — ABNORMAL LOW (ref 3.5–5.2)
Alkaline Phosphatase: 49 U/L (ref 39–117)
BILIRUBIN DIRECT: 0.3 mg/dL (ref 0.0–0.3)
BILIRUBIN TOTAL: 1 mg/dL (ref 0.3–1.2)
Indirect Bilirubin: 0.7 mg/dL (ref 0.3–0.9)
Total Protein: 5.7 g/dL — ABNORMAL LOW (ref 6.0–8.3)

## 2014-04-30 LAB — PROCALCITONIN: Procalcitonin: 0.17 ng/mL

## 2014-04-30 MED ORDER — MORPHINE SULFATE 2 MG/ML IJ SOLN
2.0000 mg | INTRAMUSCULAR | Status: DC | PRN
  Administered 2014-04-30 (×2): 2 mg via INTRAVENOUS
  Administered 2014-04-30 (×2): 4 mg via INTRAVENOUS
  Filled 2014-04-30: qty 1
  Filled 2014-04-30 (×2): qty 2
  Filled 2014-04-30: qty 1

## 2014-04-30 MED ORDER — POTASSIUM CHLORIDE 10 MEQ/50ML IV SOLN
10.0000 meq | INTRAVENOUS | Status: AC
  Administered 2014-04-30 (×4): 10 meq via INTRAVENOUS
  Filled 2014-04-30 (×4): qty 50

## 2014-04-30 MED ORDER — VITAMIN K1 10 MG/ML IJ SOLN
2.0000 mg | Freq: Once | INTRAVENOUS | Status: AC
  Administered 2014-04-30: 2 mg via INTRAVENOUS
  Filled 2014-04-30: qty 0.2

## 2014-04-30 MED ORDER — METOLAZONE 10 MG PO TABS
10.0000 mg | ORAL_TABLET | Freq: Every day | ORAL | Status: DC
  Filled 2014-04-30 (×2): qty 1

## 2014-04-30 MED ORDER — METOLAZONE 5 MG PO TABS
7.5000 mg | ORAL_TABLET | ORAL | Status: DC
  Filled 2014-04-30: qty 1

## 2014-05-01 ENCOUNTER — Inpatient Hospital Stay (HOSPITAL_COMMUNITY): Payer: BC Managed Care – PPO

## 2014-05-01 LAB — PROTIME-INR
INR: 6.45 (ref 0.00–1.49)
PROTHROMBIN TIME: 56.7 s — AB (ref 11.6–15.2)

## 2014-05-02 LAB — RESPIRATORY VIRUS PANEL
Adenovirus: NOT DETECTED
Influenza A H1: NOT DETECTED
Influenza A H3: NOT DETECTED
Influenza A: NOT DETECTED
Influenza B: NOT DETECTED
Metapneumovirus: NOT DETECTED
Parainfluenza 1: NOT DETECTED
Parainfluenza 2: NOT DETECTED
Parainfluenza 3: NOT DETECTED
Respiratory Syncytial Virus A: NOT DETECTED
Respiratory Syncytial Virus B: NOT DETECTED
Rhinovirus: NOT DETECTED

## 2014-05-04 NOTE — Progress Notes (Signed)
eLink Physician-Brief Progress Note Patient Name: Robin BaizeJudith M [name] DOB: 12/20/1950 MRN: [id number]  Date of Service  04/10/2014   HPI/Events of Note   Low k   eICU Interventions  reaplce   Intervention Category Major Interventions: Acid-Base disturbance - evaluation and management  Nelda BucksFEINSTEIN,DANIEL J. 04/05/2014, 5:10 AM

## 2014-05-04 NOTE — Progress Notes (Signed)
eLink Physician-Brief Progress Note Patient Name: Robin BaizeJudith M Luallen DOB: 12/19/1950 MRN: [id number]  Date of Service  05/01/2014   HPI/Events of Note   inr up Diffuse infiltrates  eICU Interventions  Vi tk low dsose   Intervention Category Major Interventions: Acid-Base disturbance - evaluation and management  Nelda BucksFEINSTEIN,Jarold Macomber J. 04/21/2014, 5:13 AM

## 2014-05-04 NOTE — Progress Notes (Signed)
ANTICOAGULATION CONSULT NOTE - Follow Up Consult  Pharmacy Consult for coumadin Indication: atrial fibrillation  No Known Allergies  Patient Measurements: Height: 5\' 6"  (167.6 cm) Weight: 278 lb 14.1 oz (126.5 kg) IBW/kg (Calculated) : 59.3 Heparin Dosing Weight:   Vital Signs: Temp: 98.6 F (37 C) (07/28 1200) Temp src: Oral (07/28 1200) BP: 125/65 mmHg (07/28 1400) Pulse Rate: 111 (07/28 1400)  Labs:  Recent Labs  04/28/14 0413 04/29/14 0445 04/09/2014 0405  HGB  --  9.5*  --   HCT  --  34.4*  --   PLT  --  366  --   LABPROT 37.7* 47.1* 56.7*  INR 3.83* 5.16* 6.45*  CREATININE 0.76 0.79 0.81    Estimated Creatinine Clearance: 99.3 ml/min (by C-G formula based on Cr of 0.81).   Medications:  Scheduled:  . antiseptic oral rinse  15 mL Mouth Rinse QID  . buPROPion  300 mg Oral Daily  . chlorhexidine  15 mL Mouth Rinse BID  . docusate sodium  200 mg Oral BID  . feeding supplement (GLUCERNA SHAKE)  237 mL Oral TID BM  . insulin aspart  0-15 Units Subcutaneous TID WC  . insulin aspart  0-5 Units Subcutaneous QHS  . insulin glargine  12 Units Subcutaneous QHS  . levofloxacin (LEVAQUIN) IV  750 mg Intravenous Q24H  . metolazone  10 mg Oral Daily  . potassium chloride  40 mEq Oral TID  . simvastatin  20 mg Oral Daily  . Warfarin - Pharmacist Dosing Inpatient   Does not apply q1800   Infusions:  . sodium chloride 10 mL/hr at 05/01/2014 0900  . furosemide (LASIX) infusion 20 mg/hr (04/24/2014 0900)    Assessment: 63 yo female with afib is currently  supratherapeutic INR 6 on coumadin. Elevated INR due to hepatic congestion LFT and Tbil wnl or DI with fluconazole.    Goal of Therapy:  INR 2-3 Monitor platelets by anticoagulation protocol: Yes   Plan:  1) Continue to hold warfarin Vit K 2mg  IV given today 2) INR in am   Leota SauersLisa Briza Bark Pharm.D. CPP, BCPS Clinical Pharmacist [phone] 04/16/2014 4:20 PM

## 2014-05-04 NOTE — Progress Notes (Signed)
2205-Pt change in status. Unresponsive. Agonal breathing. Oxygen saturation sustaining in the 70's. HR slowly declining. BP reading 57/30. NRB at 100%. Pt is a DNR. Husband at bedside. NP notified and waiting for response.    2210-HR in the 30's. RR 5-7. Agonal breathing.   2217-Patient asystole. RR 0. No pulse ox reading. Patient pronounced by M.Piel RN and E.Ofori RN. MD notified.   2230-[facility] notified. Pt is suitable for potential tissue and eye donations. Will prep accordingly.    M.Foster SimpsonPiel, RN

## 2014-05-04 NOTE — Progress Notes (Signed)
NUTRITION FOLLOW UP  Intervention:    Continue PO diet with Glucerna Shakes TID as respiratory status allows. Diet advancement per MD.  Nutrition Dx:   Inadequate oral intake related to respiratory failure and CHF as evidenced by reported intake less than estimated needs, ongoing.  Goal:   Intake to meet >90% of estimated nutrition needs, unmet.  Monitor:   Diet advancement, PO intake, labs, weight trend.  Assessment:   [name] with acute on chronic respiratory failure, OSA/OHS, chronic diastolic heart failure, admitted 7/13 for SOB. Had persistent SOB despite BiPAP and diuresis.  Patient was made a full DNR earlier today. She is refusing BiPAP. Currently NPO. Per RN, she had some Glucerna earlier today.   Height: Ht Readings from Last 1 Encounters:  04/26/14 5\' 6"  (1.676 m)    Weight Status:   Wt Readings from Last 1 Encounters:  04/26/2014 278 lb 14.1 oz (126.5 kg)  04/25/14  294 lb 12.1 oz (133.7 kg)   Re-estimated needs:  Kcal: 2100-2300  Protein: 120-140 g  Fluid: 2.1-2.3 L  Skin: stage II wound on sacrum  Diet Order: NPO   Intake/Output Summary (Last 24 hours) at 04/23/2014 1515 Last data filed at 04/29/2014 1200  Gross per 24 hour  Intake   1060 ml  Output   1200 ml  Net   -140 ml    Last BM: smear 7/24   Labs:   Recent Labs Lab 04/28/14 0413 04/29/14 0445 04/22/2014 0405  NA 146 143 145  K 3.4* 3.3* 2.8*  CL 93* 91* 90*  CO2 43* 43* 45*  BUN 32* 36* 36*  CREATININE 0.76 0.79 0.81  CALCIUM 8.6 8.4 8.5  GLUCOSE 116* 136* 96    CBG (last 3)   Recent Labs  04/16/2014 0359 04/07/2014 0748 04/24/2014 1218  GLUCAP 106* 108* 132*    Scheduled Meds: . antiseptic oral rinse  15 mL Mouth Rinse QID  . buPROPion  300 mg Oral Daily  . chlorhexidine  15 mL Mouth Rinse BID  . docusate sodium  200 mg Oral BID  . feeding supplement (GLUCERNA SHAKE)  237 mL Oral TID BM  . insulin aspart  0-15 Units Subcutaneous TID WC  . insulin aspart   0-5 Units Subcutaneous QHS  . insulin glargine  12 Units Subcutaneous QHS  . levofloxacin (LEVAQUIN) IV  750 mg Intravenous Q24H  . metolazone  10 mg Oral Daily  . potassium chloride  40 mEq Oral TID  . simvastatin  20 mg Oral Daily  . Warfarin - Pharmacist Dosing Inpatient   Does not apply q1800    Continuous Infusions: . sodium chloride 10 mL/hr at 04/19/2014 0900  . furosemide (LASIX) infusion 20 mg/hr (04/21/2014 0900)    Joaquin CourtsKimberly Ariz Terrones, RD, LDN, CNSC Pager [phone] After Hours Pager [phone]

## 2014-05-04 NOTE — Progress Notes (Signed)
Chaplain was requested by pt and family members for prayer. Chaplin visited pt and pt husband and mother in law. Pt requested to hold chaplin's hand and pray. Chaplin provided prayer and spiritual comfort to pt and family.   Cindie CrumblyBeverley Keisha Amer, [address]haplain

## 2014-05-04 NOTE — Progress Notes (Signed)
Noted events over night. We will sign off at this time. [phone]

## 2014-05-04 NOTE — Progress Notes (Signed)
Patient ID: Robin BaizeJudith M [name], female   DOB: 08/27/1951, 63 y.o.   MRN: [id number]    SUBJECTIVE: Patient decided on DNR today, no Bipap.  Currently oxygen saturation 81% on face mask. Diuresis not brisk yesterday, metolazone added today.   Scheduled Meds: . antiseptic oral rinse  15 mL Mouth Rinse QID  . buPROPion  300 mg Oral Daily  . chlorhexidine  15 mL Mouth Rinse BID  . docusate sodium  200 mg Oral BID  . feeding supplement (GLUCERNA SHAKE)  237 mL Oral TID BM  . insulin aspart  0-15 Units Subcutaneous TID WC  . insulin aspart  0-5 Units Subcutaneous QHS  . insulin glargine  12 Units Subcutaneous QHS  . levofloxacin (LEVAQUIN) IV  750 mg Intravenous Q24H  . metolazone  10 mg Oral Daily  . potassium chloride  40 mEq Oral TID  . simvastatin  20 mg Oral Daily  . Warfarin - Pharmacist Dosing Inpatient   Does not apply q1800   Continuous Infusions: . sodium chloride 10 mL/hr at 04/07/2014 0900  . furosemide (LASIX) infusion 20 mg/hr (04/07/2014 0900)   PRN Meds:.acetaminophen, acetaminophen, HYDROcodone-acetaminophen, morphine injection, ondansetron (ZOFRAN) IV, ondansetron, sodium chloride    Filed Vitals:   04/06/2014 0900 04/11/2014 1000 04/05/2014 1100 04/07/2014 1200  BP: 124/65 126/51 100/61 102/44  Pulse: 77 77 97 102  Temp:    98.6 F (37 C)  TempSrc:    Oral  Resp: 26 31 36 31  Height:      Weight:      SpO2: 95% 96% 84% 81%    Intake/Output Summary (Last 24 hours) at 04/09/2014 1302 Last data filed at 04/20/2014 1200  Gross per 24 hour  Intake   1100 ml  Output   1200 ml  Net   -100 ml    LABS: Basic Metabolic Panel:  Recent Labs  40/98/1107/27/15 0445 04/22/2014 0405  NA 143 145  K 3.3* 2.8*  CL 91* 90*  CO2 43* 45*  GLUCOSE 136* 96  BUN 36* 36*  CREATININE 0.79 0.81  CALCIUM 8.4 8.5   Liver Function Tests:  Recent Labs  04/06/2014 0405  AST 22  ALT 6  ALKPHOS 49  BILITOT 1.0  PROT 5.7*  ALBUMIN 2.4*   No results found for this basename: LIPASE, AMYLASE,   in the last 72 hours CBC:  Recent Labs  04/29/14 0445  WBC 15.2*  HGB 9.5*  HCT 34.4*  MCV 83.5  PLT 366   Cardiac Enzymes: No results found for this basename: CKTOTAL, CKMB, CKMBINDEX, TROPONINI,  in the last 72 hours BNP: No components found with this basename: POCBNP,  D-Dimer: No results found for this basename: DDIMER,  in the last 72 hours Hemoglobin A1C: No results found for this basename: HGBA1C,  in the last 72 hours Fasting Lipid Panel: No results found for this basename: CHOL, HDL, LDLCALC, TRIG, CHOLHDL, LDLDIRECT,  in the last 72 hours Thyroid Function Tests: No results found for this basename: TSH, T4TOTAL, FREET3, T3FREE, THYROIDAB,  in the last 72 hours Anemia Panel: No results found for this basename: VITAMINB12, FOLATE, FERRITIN, TIBC, IRON, RETICCTPCT,  in the last 72 hours  RADIOLOGY: Ct Chest Wo Contrast  04/29/2014   CLINICAL DATA:  Infiltrates  EXAM: CT CHEST WITHOUT CONTRAST  TECHNIQUE: Multidetector CT imaging of the chest was performed following the standard protocol without IV contrast maternal administration.  COMPARISON:  April 29, 2014  FINDINGS: There is a moderate left pleural effusion with  left lower lobe airspace consolidation. There is a small right pleural effusion. There is diffuse alveolar edema throughout both lungs diffusely. Heart is mildly enlarged with scattered foci of coronary artery calcification. Pericardium is not thickened.  There are multiple small mediastinal lymph nodes, but there is no frank adenopathy by size criteria.  Central catheter tip is in the superior vena cava.  No pneumothorax.  Visualized upper abdominal structures appear unremarkable.  There is degenerative change in the thoracic spine. There are no blastic or lytic bone lesions. Visualized thyroid shows mild inhomogeneity on the left.  IMPRESSION: The appearance of the lungs is consistent with ARDS. There are bilateral pleural effusions and cardiomegaly. A degree of  superimposed congestive heart failure is questioned. There is airspace consolidation in the left base which may well represent superimposed pneumonia.  No adenopathy.  Mild inhomogeneity in the thyroid. This finding may warrant nonemergent thyroid ultrasound.   Electronically Signed   By: Bretta Bang M.D.   On: 04/29/2014 16:05   Dg Chest Port 1 View  04/29/2014   CLINICAL DATA:  Hypoxia  EXAM: PORTABLE CHEST - 1 VIEW  COMPARISON:  04/27/2014  FINDINGS: The patient is rotated towards the right. There are bilateral small pleural effusions. There are bilateral interstitial and alveolar airspace opacities. There is no pneumothorax. Stable cardiomediastinal silhouette. Unremarkable osseous structures.  IMPRESSION: Bilateral interstitial and alveolar airspace opacities without significant interval change compared with the prior exam. Differential diagnosis includes pulmonary edema versus multilobar pneumonia versus ARDS.   Electronically Signed   By: Elige Ko   On: 04/29/2014 13:44    PHYSICAL EXAM General: Lethargic Neck: JVP difficult (thick neck), no thyromegaly or thyroid nodule.  Lungs: Crackles dependently.  CV: Nondisplaced PMI.  Heart irregular S1/S2, no S3/S4, no murmur.  1+ ankle edema.   Abdomen: Soft, nontender, no hepatosplenomegaly, no distention.  Neurologic: Alert and oriented x 3.  Psych: Normal affect. Extremities: No clubbing or cyanosis.   TELEMETRY: Reviewed telemetry pt in atrial fibrillation, HR 100s  ASSESSMENT AND PLAN: 63 yo with OHS/OSA and morbid obesity presented with acute respiratory failure.  1. Atrial fibrillation: Chronic, on coumadin.  HR reasonable.  2. Acute respiratory failure: She has been diuresed on high dose IV Lasix gtt without much effect on her hypoxemia.  OHS/OSA appears to be playing a major role.  She has decided on DNR/DNI today with no Bipap.  Oxygen saturation 81% on facemask.  She received metolazone this morning to try to increase  diuresis, but not sure this will help much (think this is primarily a pulmonary problem).    Marca Ancona 04/17/2014 1:05 PM

## 2014-05-04 NOTE — Progress Notes (Signed)
Spoke with patient and husband extensively, after discussion, decision was made to make patient a full DNR, no BiPAP (patient refused) and [facility].  PCCM will sign off, please call back if needed.  Alyson ReedyWesam G. Tramayne Sebesta, M.D. [facility]. Pager: [phone]. After hours pager: [phone].

## 2014-05-04 NOTE — Progress Notes (Signed)
CRITICAL VALUE ALERT  Critical value received:  K 2.8, CO2 45, PT 56.7, INR 6.45  Date of notification:  04/10/2014  Time of notification:  05:05  Critical value read back:Yes.    Nurse who received alert:  BShepherd, RN  MD notified (1st page):  Tyson AliasFeinstein  Time of first page:  05:07  MD notified (2nd page):  Time of second page:  Responding MD:  Tyson AliasFeinstein  Time MD responded:  05:10

## 2014-05-04 NOTE — Progress Notes (Signed)
PULMONARY / CRITICAL CARE MEDICINE  Name: Robin [name] MRN: [id number] DOB: [birth year]    ADMISSION DATE:  04/21/2014 CONSULTATION DATE:  04/24/2014  REFERRING MD:  Tat PRIMARY SERVICE:  TRH  CHIEF COMPLAINT:  SOB  BRIEF PATIENT DESCRIPTION: 63 yo morbidly obese with OSA/OHS, chronic respiratory failure, chronic diastolic heart failure, admitted 7/13 with dyspnea. PCCM has been consulted twice this admission for dyspnea and hypoxia. Both times she has seemingly had improvement with BiPAP and diuresis. Now being asked to see for refractory dyspnea and hypoxia despite negative fluid balance and BiPAP.   SIGNIFICANT EVENTS / STUDIES:  7/13  TTE >>> EF 55-60%, LA and RA dilation, diastolic fxn unable to be evaluated due to a-fib.    LINES / TUBES: Right PICC 7/20 >>>  CULTURES: Blood 7/13 >>> neg Urine 7/13 >>> neg  ANTIBIOTICS: Ceftriaxone 7/20 >>> 7/22 Diflucan 7/14 >>> 7/21 Vanc 7/13 >>> 7/21 Cefepime 7/13 >>> 7/14 Levaquin 7/27 >>>  SUBJECTIVE: Still on bipap, no new complaints  VITAL SIGNS: Temp:  [97.9 F (36.6 C)-98.3 F (36.8 C)] 98.2 F (36.8 C) (07/28 0749) Pulse Rate:  [71-94] 82 (07/28 0736) Resp:  [16-42] 32 (07/28 0736) BP: (98-147)/(45-71) 124/51 mmHg (07/28 0700) SpO2:  [85 %-96 %] 92 % (07/28 0736) FiO2 (%):  [85 %-100 %] 100 % (07/28 0430) Weight:  [278 lb 14.1 oz (126.5 kg)] 278 lb 14.1 oz (126.5 kg) (07/28 0200)  PHYSICAL EXAMINATION: General: Morbidly obese female on BiPAP with mildly increased WOB.  Neuro: Alert, oriented. PERRL.  HEENT: Mary[place]/AT, JVD difficult to determine d/t neck girth.  Cardiovascular: Irreg Irreg, Regular rate. No MRG Lungs: Mildly labored resps on BiPAP. Diminished, coarse breath sounds R >L.  Abdomen: Obese, soft, non-tender, non-distended.  Extremities: No acute deformity. Unna boots in place  Skin: Ecchymotic area to RUE around PICC line.   LABS:  Recent Labs Lab 04/28/14 0413 04/29/14 0445 28-May-2014 0405  NA 146  143 145  K 3.4* 3.3* 2.8*  CL 93* 91* 90*  CO2 43* 43* 45*  BUN 32* 36* 36*  CREATININE 0.76 0.79 0.81  GLUCOSE 116* 136* 96    Recent Labs Lab 04/26/14 0507 04/27/14 0355 04/29/14 0445  HGB 9.9* 9.9* 9.5*  HCT 35.8* 37.1 34.4*  WBC 13.0* 15.3* 15.2*  PLT 354 369 366   ESR 36, CRP 9.1 PCT stable at 0.17  IMAGING:   Ct Chest Wo Contrast  04/29/2014   CLINICAL DATA:  Infiltrates  EXAM: CT CHEST WITHOUT CONTRAST  TECHNIQUE: Multidetector CT imaging of the chest was performed following the standard protocol without IV contrast maternal administration.  COMPARISON:  April 29, 2014  FINDINGS: There is a moderate left pleural effusion with left lower lobe airspace consolidation. There is a small right pleural effusion. There is diffuse alveolar edema throughout both lungs diffusely. Heart is mildly enlarged with scattered foci of coronary artery calcification. Pericardium is not thickened.  There are multiple small mediastinal lymph nodes, but there is no frank adenopathy by size criteria.  Central catheter tip is in the superior vena cava.  No pneumothorax.  Visualized upper abdominal structures appear unremarkable.  There is degenerative change in the thoracic spine. There are no blastic or lytic bone lesions. Visualized thyroid shows mild inhomogeneity on the left.  IMPRESSION: The appearance of the lungs is consistent with ARDS. There are bilateral pleural effusions and cardiomegaly. A degree of superimposed congestive heart failure is questioned. There is airspace consolidation in the left base which may  well represent superimposed pneumonia.  No adenopathy.  Mild inhomogeneity in the thyroid. This finding may warrant nonemergent thyroid ultrasound.   Electronically Signed   By: Lowella Grip M.D.   On: 04/29/2014 16:05   Dg Chest Port 1 View  04/29/2014   CLINICAL DATA:  Hypoxia  EXAM: PORTABLE CHEST - 1 VIEW  COMPARISON:  04/27/2014  FINDINGS: The patient is rotated towards the right.  There are bilateral small pleural effusions. There are bilateral interstitial and alveolar airspace opacities. There is no pneumothorax. Stable cardiomediastinal silhouette. Unremarkable osseous structures.  IMPRESSION: Bilateral interstitial and alveolar airspace opacities without significant interval change compared with the prior exam. Differential diagnosis includes pulmonary edema versus multilobar pneumonia versus ARDS.   Electronically Signed   By: Kathreen Devoid   On: 04/29/2014 13:44   ASSESSMENT / PLAN:  PULMONARY: A:Acute on chronic hypoxic respiratory failure. Significant airspace disease Pulmonary edema is possible but unlikely given significant diuresis and discordance between volume status and degree of hypoxia Possible infectious / inflammatory etiology Morbid obesity likely contributing to low lung volumes, low FRC and hypoxia  Bilateral effusions, which may be contributing to low lung volumes OSA / OHS, likely lead to hypercarbic but not hypoxemic respiratory failure Doubt PE given significant parenchymal disease Difficulty interpreting plain chest films given body habitus P:  - Supplemental oxygen for SpO2>92. - BiPAP / NIMV PRN, family discussing code status, will likely need intubation today. - Continue diuresis. - Would PA catheter be helpful if continues to to be full code. - Respiratory viral panel pending. - Family discussing plan of care.  CARDIOVASCULAR: Acute exacerbation on chronic diastolic CHF  Chronic Atrial fibrillation  - Cardiology following - attempts to diurese continue, w/ pt currently negative ~14L - lasix gtt continues  - On chronic warfarin - rate controlled   HEMATOLOGIC: A: Supratherapeutic, chronic warfarin for afib - likely related to acute renal failure P:  - Vitamin K given this am, INR in AM.  RENAL:  Acute on chronic renal failure (CKD stage III)  Hypokalemia  - Crt is stable  - Cont to replace electrolytes and follow  - Lasix 20  mg/hr. - Zaroxolyn 10 mg PO x1.  INFECTIOUS: Pyuria Possible HCAP - Completed full course ceftriaxone for possible UTI - Started empiric levaquin on 7/27, continue for now.  GASTROENTEROLOGIC: A: No acute issues P:  - NPO - SUP: not indicated at this time  ENDOCRINE Diabetes - CBGs/SSI - Restart lantus at 12u  NEUROLOGIC: No acute issues - Continue home wellbutrin, vicodin  Beverlyn Roux, MD, MPH [facility] PGY-2 05-15-2014 7:54 AM  Spoke with patient and husband, patient is clearly in respiratory and liver failure, will need intubation if that is the family's decision.  We informed them of info and if full code then will need intubation and swan placement if INR is improved.  CC time 35 min.  Patient seen and examined, agree with above note.  I dictated the care and orders written for this patient under my direction.  Rush Farmer, MD [phone]

## 2014-05-04 NOTE — Progress Notes (Signed)
Chaplain requested for emotional and spiritual support at time of patient's death. Offered support to patient's husband through presence, empathic listening, prayer, and spiritual comfort. He appreciated chaplain's presence and said his nephew was on the way and he would like some time alone with his wife. Please page if needed.   Maurene CapesHillary D Irusta [phone]

## 2014-05-04 NOTE — Progress Notes (Signed)
PT Cancellation Note  Patient Details Name: Jewel BaizeJudith M Routzahn MRN: [id number] DOB: 11/28/1950   Cancelled Treatment:    Reason Eval/Treat Not Completed: Medical issues which prohibited therapy .  Nurse concerned that pt may have to be intubated.  Will check back tomorrow.  Thanks.     INGOLD,Andru Genter 04/03/2014, 8:57 AM Audree Camelawn Ingold,PT Acute Rehabilitation [phone] (pager)

## 2014-05-04 DEATH — deceased

## 2014-05-13 NOTE — Discharge Summary (Signed)
NAMSilvestre Moment:  Milling, Damira             ACCOUNT NO.:  [id number]  MEDICAL RECORD NO.:  [id number]  LOCATION:                                 FACILITY:  PHYSICIAN:  Felipa EvenerWesam Jake Blas Riches, MD  DATE OF BIRTH:  1951/09/11  DATE OF ADMISSION:  04/14/2014 DATE OF DISCHARGE:  05/01/2014                              DISCHARGE SUMMARY   DEATH SUMMARY  PRIMARY DIAGNOSIS/CAUSE OF DEATH:  Heart failure.  SECONDARY DIAGNOSES:  Respiratory failure, liver failure, acute renal failure, atrial fibrillation with rapid ventricular response and supratherapeutic INR due to Coumadin.  HOSPITAL COURSE:  The patient is a 63 year old female with history of morbid obesity and congestive heart failure, presents to the hospital with shortness of breath.  The patient developed acute pulmonary edema and hepatic congestion, developed respiratory, liver and renal failure as well as heart failure.  The patient was clearly decompensating.  Had conversation with the patient and husband.  The patient was clearly in respiratory and liver failure, needs to be intubated.  However, the patient refused intubation and transition to comfort care if the patient serious further.  The patient was transferred to Step-Down Unit. Comfort care was initiated and then the patient expired shortly thereafter.     Felipa EvenerWesam Jake Caidon Foti, MD     WJY/MEDQ  D:  05/10/2014  T:  05/11/2014  Job:  [id number]

## 2014-06-04 DEATH — deceased

## 2016-03-10 IMAGING — US US RENAL
1 series · 14 of 25 positions shown · non-contrast
Comparison: None.

CLINICAL DATA: Acute renal failure.

EXAM:
RENAL/URINARY TRACT ULTRASOUND COMPLETE

[Series 1: us renal · 0.23mm/px · 14 of 30 slices shown]
[im 1/30]
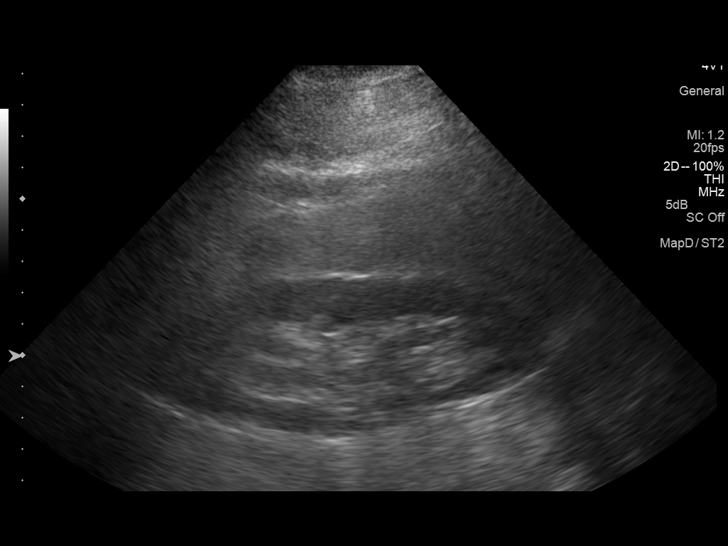
[im 3/30]
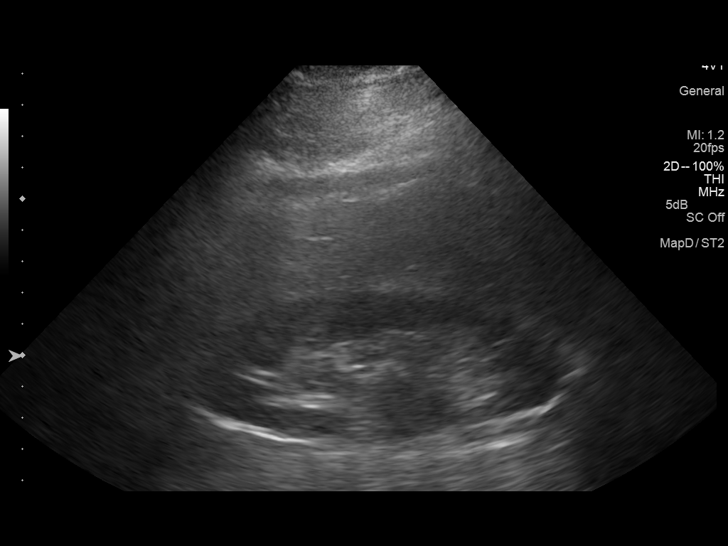
[im 5/30]
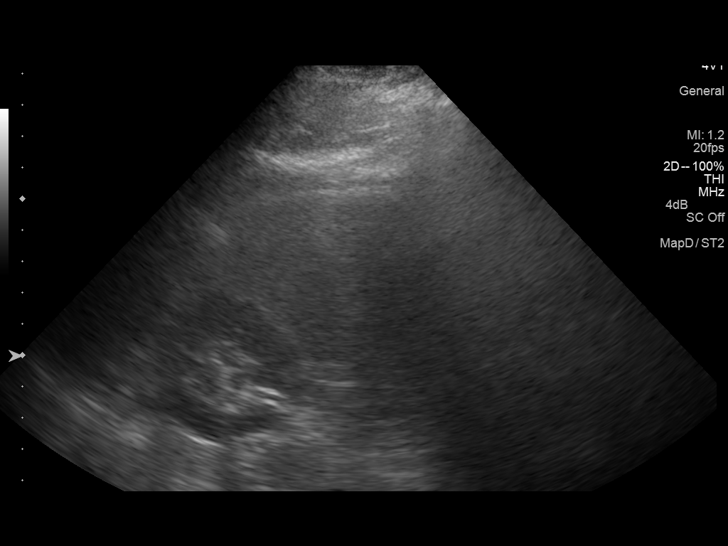
[im 8/30]
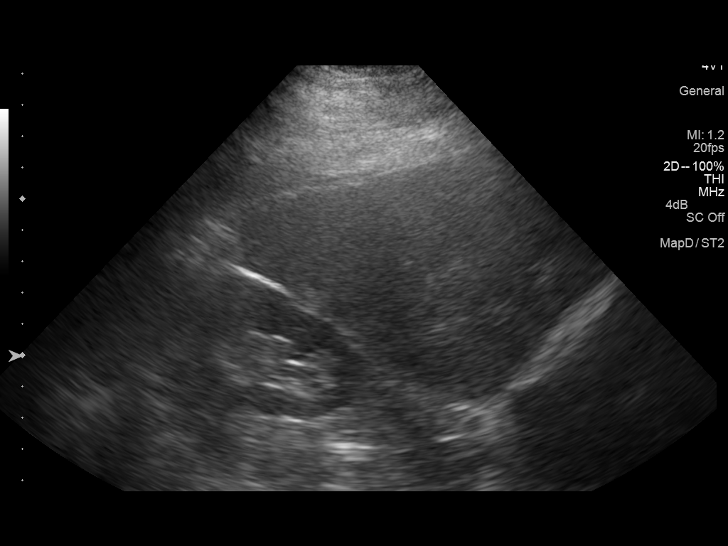
[im 10/30]
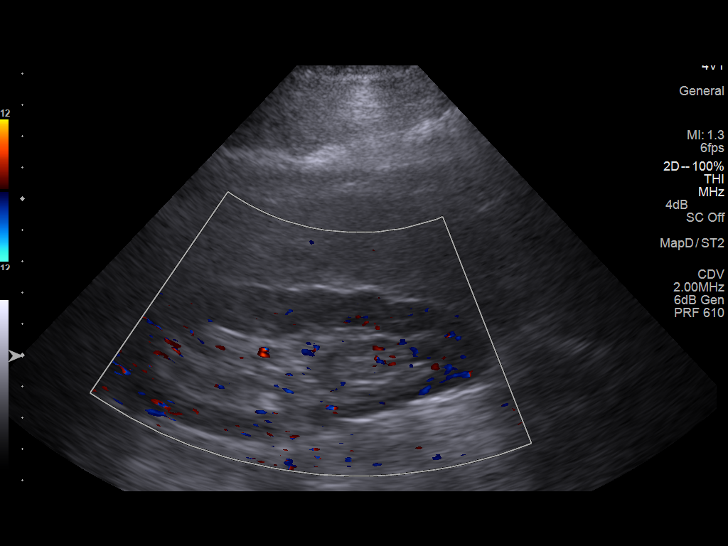
[im 11/30]
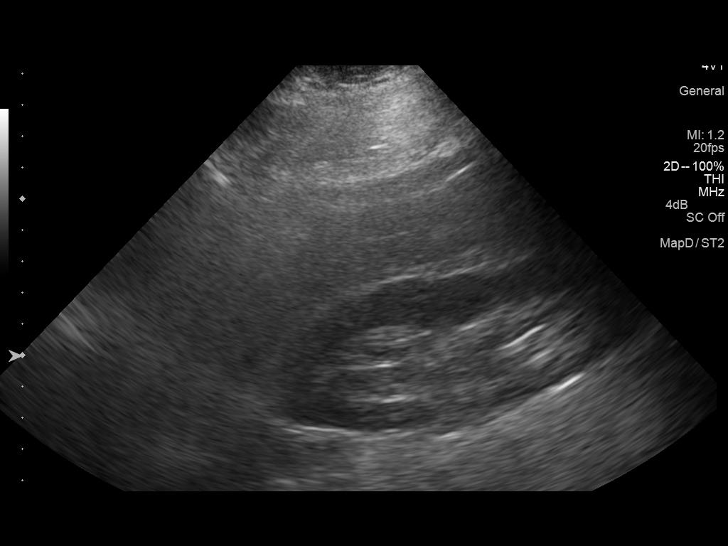
[im 14/30]
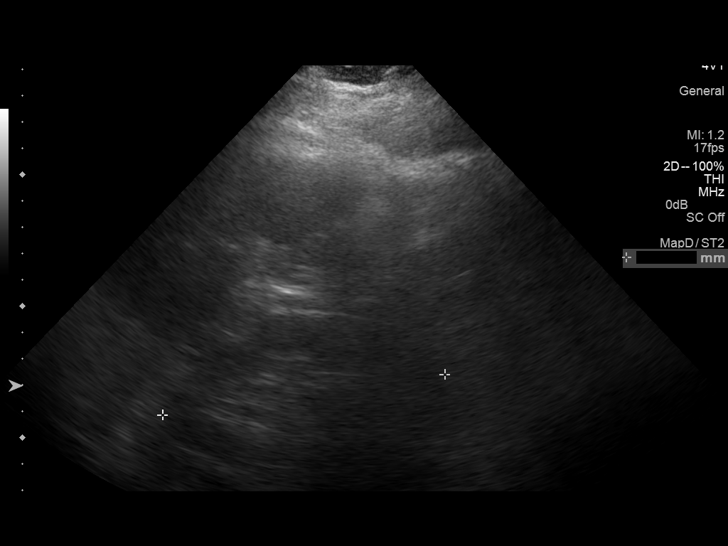
[im 16/30]
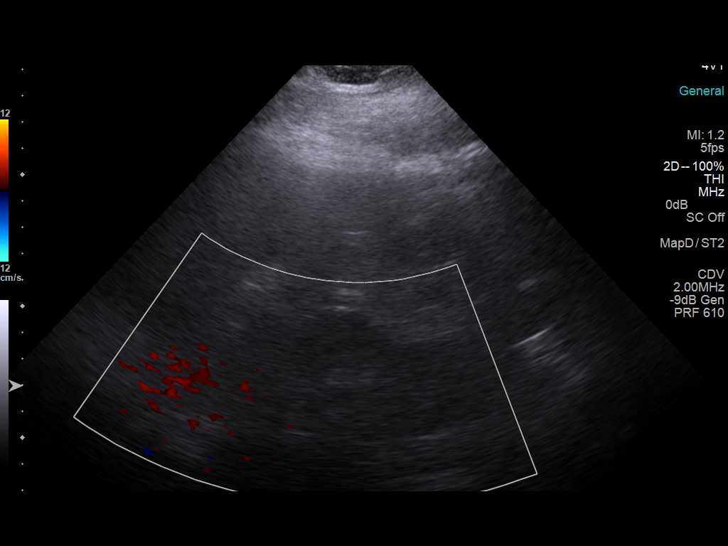
[im 19/30]
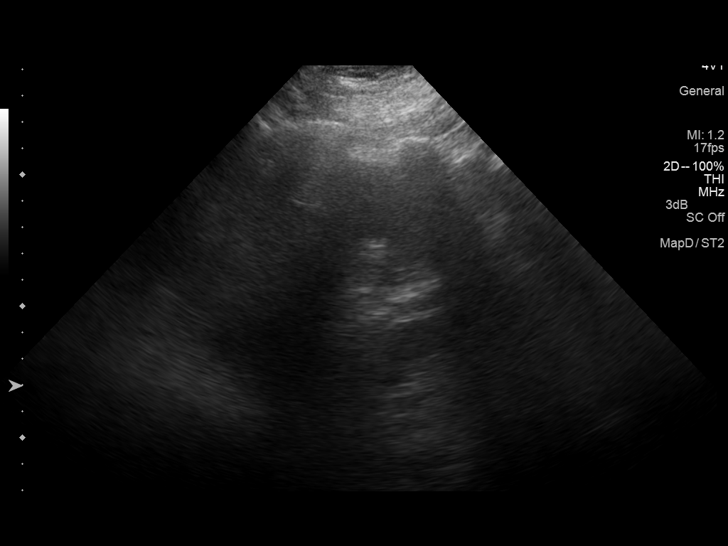
[im 20/30]
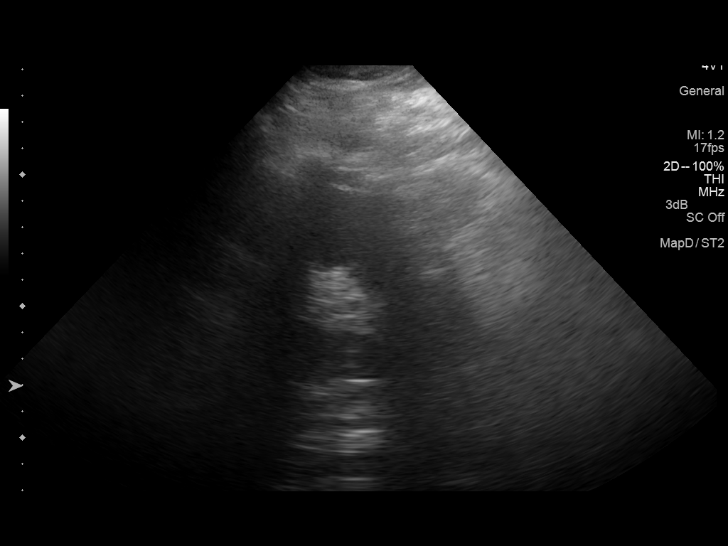
[im 22/30]
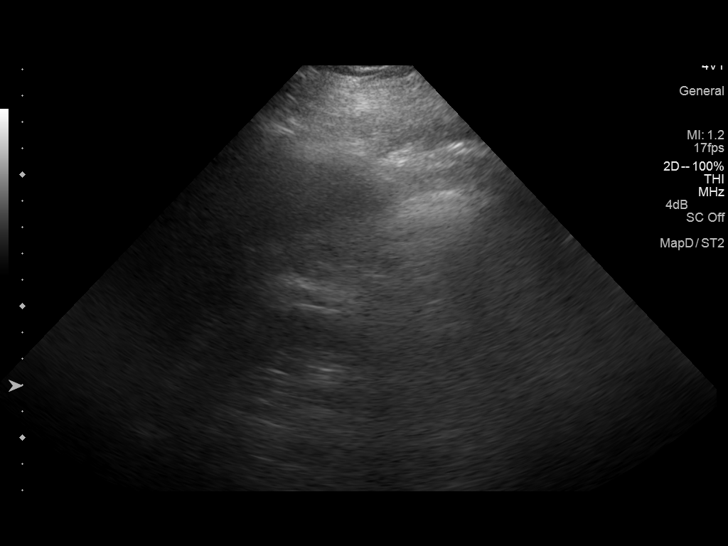
[im 25/30]
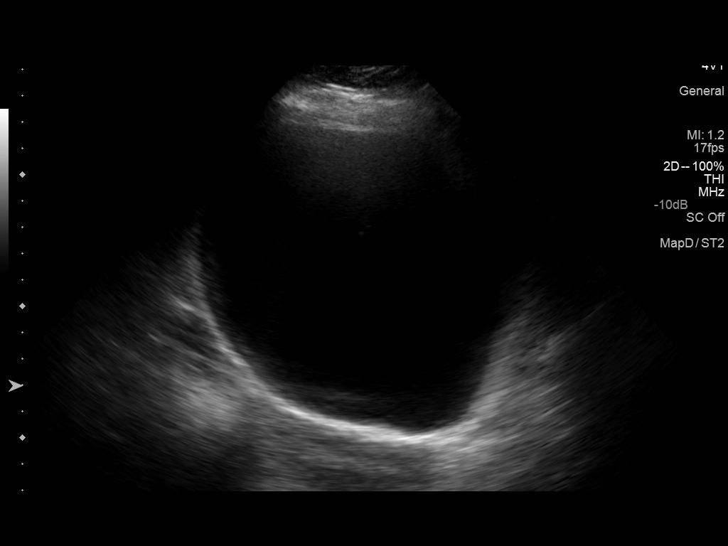
[im 27/30]
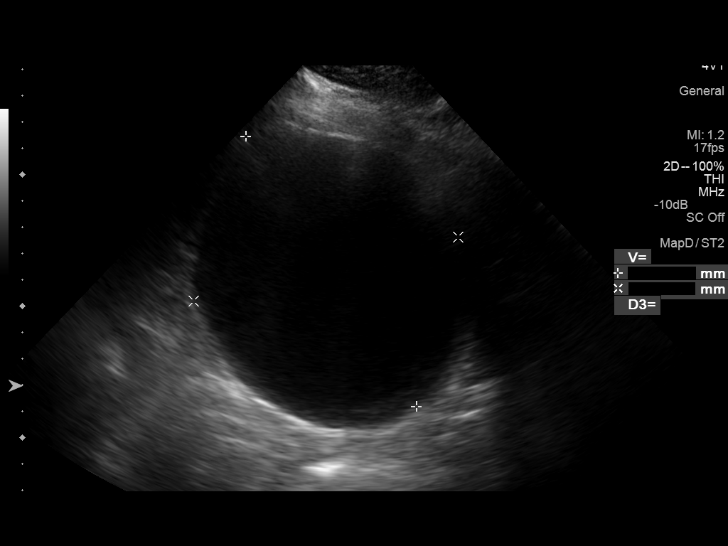
[im 30/30]
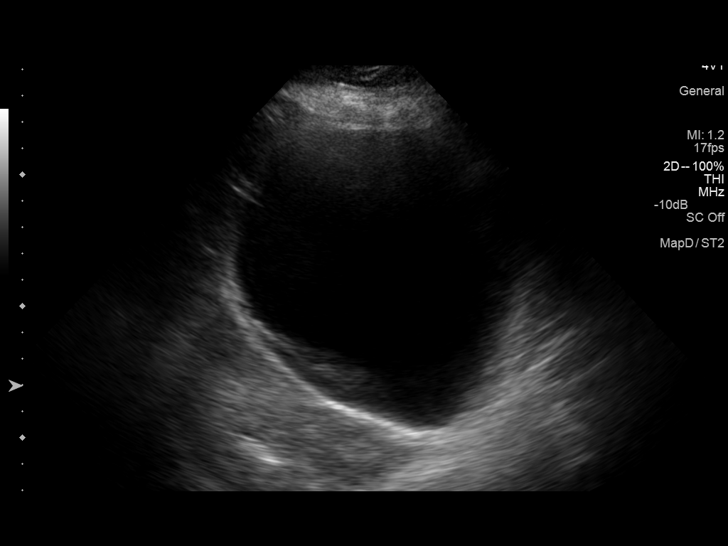

[14 of 25 positions shown; findings below may reference images not displayed]

FINDINGS: Right Kidney:

Length: 13.0 cm. Cortical thinning. Right kidney is slightly
echogenic suggesting chronic medical renal disease.

Left Kidney:

Length: 12.1 cm.. Cortical thinning. Left kidney is echogenic
suggesting chronic medical renal disease.

Bladder:

Bladder is slightly distended. Patient unable to void. Bladder
volume is 814.9 cc.

This was a limited exam due to patient's body habitus and patient's
inability to turn.
IMPRESSION: 1. Renal cortical thinning and increased echogenicity suggesting
chronic medical renal disease. No hydronephrosis.
2. Mild bladder distention.  Patient unable to void.
